# Patient Record
Sex: Female | Born: 1955 | Race: White | Hispanic: No | Smoking: Former smoker
Health system: Southern US, Community
[De-identification: ages and names within clinical notes are randomized; demographics above are authoritative.]

## PROBLEM LIST (undated history)

## (undated) DIAGNOSIS — K5090 Crohn's disease, unspecified, without complications: Secondary | ICD-10-CM

## (undated) DIAGNOSIS — M8580 Other specified disorders of bone density and structure, unspecified site: Secondary | ICD-10-CM

## (undated) DIAGNOSIS — F419 Anxiety disorder, unspecified: Secondary | ICD-10-CM

## (undated) DIAGNOSIS — I10 Essential (primary) hypertension: Secondary | ICD-10-CM

## (undated) DIAGNOSIS — A749 Chlamydial infection, unspecified: Secondary | ICD-10-CM

## (undated) DIAGNOSIS — K219 Gastro-esophageal reflux disease without esophagitis: Secondary | ICD-10-CM

## (undated) HISTORY — DX: Crohn's disease, unspecified, without complications: K50.90

## (undated) HISTORY — PX: TONSILECTOMY/ADENOIDECTOMY WITH MYRINGOTOMY: SHX6125

## (undated) HISTORY — DX: Gastro-esophageal reflux disease without esophagitis: K21.9

## (undated) HISTORY — DX: Chlamydial infection, unspecified: A74.9

## (undated) HISTORY — PX: OTHER SURGICAL HISTORY: SHX169

---

## 2004-08-20 ENCOUNTER — Ambulatory Visit: Payer: Self-pay | Admitting: Gastroenterology

## 2005-01-22 ENCOUNTER — Ambulatory Visit: Payer: Self-pay | Admitting: Internal Medicine

## 2006-10-07 ENCOUNTER — Ambulatory Visit: Payer: Self-pay | Admitting: Internal Medicine

## 2007-09-27 ENCOUNTER — Ambulatory Visit: Payer: Self-pay | Admitting: Unknown Physician Specialty

## 2007-10-19 ENCOUNTER — Ambulatory Visit: Payer: Self-pay | Admitting: Internal Medicine

## 2007-12-28 ENCOUNTER — Inpatient Hospital Stay: Payer: Self-pay | Admitting: Internal Medicine

## 2008-10-20 ENCOUNTER — Ambulatory Visit: Payer: Self-pay | Admitting: Internal Medicine

## 2009-11-07 ENCOUNTER — Ambulatory Visit: Payer: Self-pay | Admitting: Internal Medicine

## 2010-10-22 LAB — HM PAP SMEAR

## 2010-12-25 ENCOUNTER — Ambulatory Visit: Payer: Self-pay | Admitting: Internal Medicine

## 2011-12-23 ENCOUNTER — Telehealth: Payer: Self-pay | Admitting: Internal Medicine

## 2011-12-23 DIAGNOSIS — Z139 Encounter for screening, unspecified: Secondary | ICD-10-CM

## 2011-12-23 NOTE — Telephone Encounter (Signed)
Pt stated it is time for mammogram Pt needs order  Her pt with you is 03/12/12 [place]  Earlier morning or late afternoon

## 2011-12-23 NOTE — Telephone Encounter (Signed)
I placed the order for her mammogram.  See note.

## 2012-03-01 ENCOUNTER — Ambulatory Visit: Payer: Self-pay | Admitting: Internal Medicine

## 2012-03-12 ENCOUNTER — Encounter: Payer: Self-pay | Admitting: Internal Medicine

## 2012-03-12 ENCOUNTER — Ambulatory Visit (INDEPENDENT_AMBULATORY_CARE_PROVIDER_SITE_OTHER): Payer: BC Managed Care – PPO | Admitting: Internal Medicine

## 2012-03-12 VITALS — BP 118/68 | HR 75 | Temp 98.1°F | Ht 65.0 in | Wt 156.2 lb

## 2012-03-12 DIAGNOSIS — K5090 Crohn's disease, unspecified, without complications: Secondary | ICD-10-CM

## 2012-03-12 DIAGNOSIS — K219 Gastro-esophageal reflux disease without esophagitis: Secondary | ICD-10-CM

## 2012-03-12 DIAGNOSIS — M899 Disorder of bone, unspecified: Secondary | ICD-10-CM

## 2012-03-12 DIAGNOSIS — M949 Disorder of cartilage, unspecified: Secondary | ICD-10-CM

## 2012-03-12 DIAGNOSIS — M8580 Other specified disorders of bone density and structure, unspecified site: Secondary | ICD-10-CM

## 2012-03-12 NOTE — Progress Notes (Signed)
  Subjective:    Patient ID: Lauren Chang, female    DOB: Mar 08, 1956, 57 y.o.   MRN: [id number]  HPI 57 year old female with past history of Crohn's disease and acid reflux who comes in today to follow up on these issues as well as for a complete physical exam.  She states she is doing relatively well.  Saw Ebony Cargo in follow up for her Crohn's.  On Delzicol 432m tablets now.  (same as asacol).  Bowels stable.  Due a follow up colonoscopy next year.  No abdominal pain or cramping.  No cardiac symptoms with increased activity or exertion.  Breathing stable.    Past Medical History  Diagnosis Date  . Crohn's disease     large intestine  . Chlamydia   . GERD (gastroesophageal reflux disease)     Current Outpatient Prescriptions on File Prior to Visit  Medication Sig Dispense Refill  . Calcium Carbonate-Vitamin D (CALCIUM 600+D) 600-400 MG-UNIT per tablet Take 1 tablet by mouth 2 (two) times daily.      . mesalamine (ASACOL) 400 MG EC tablet 2 tablets tid      . pantoprazole (PROTONIX) 40 MG tablet Take 40 mg by mouth daily.        Review of Systems Patient denies any headache, lightheadedness or dizziness.  No sinus or allergy symptoms.   No chest pain, tightness or palpitations.  No increased shortness of breath, cough or congestion.  No nausea or vomiting.  No acid reflux.  No swallowing difficulty.  No abdominal pain or cramping.  No bowel change, such as diarrhea, constipation, BRBPR or melana.  No urine change.        Objective:   Physical Exam Filed Vitals:   03/12/12 0813  BP: 118/68  Pulse: 75  Temp: 98.1 F ([id number])   57year old female in no acute distress.   HEENT:  Nares- clear.  Oropharynx - without lesions. NECK:  Supple.  Nontender.  No audible bruit.  HEART:  Appears to be regular. LUNGS:  No crackles or wheezing audible.  Respirations even and unlabored.  RADIAL PULSE:  Equal bilaterally.    BREASTS:  No nipple discharge or nipple retraction present.   Could not appreciate any distinct nodules or axillary adenopathy.  ABDOMEN:  Soft, nontender.  Bowel sounds present and normal.  No audible abdominal bruit.  GU:  Normal external genitalia.  Vaginal vault without lesions.  Cervix identified.  Pap performed. Could not appreciate any adnexal masses or tenderness.   RECTAL:  Heme negative.   EXTREMITIES:  No increased edema present.  DP pulses palpable and equal bilaterally.           Assessment & Plan:  CARDIOVASCULAR.  Asymptomatic.    INCREASED PSYCHOSOCIAL STRESSORS.  Doing well.  Follow.   GYN.  Was seeing Dr KRayford Halsted  Pelvic/pap today.  Follow.    HEALTH MAINTENANCE.  Physical today.  Pelvic/pap today.  Colonoscopy 09/27/07 revealed a few erosions in the cecum, internal hemorrhoids and a 466mpolyp in the transverse colon.  States due a follow up colonoscopy next year.  Mammogram 03/01/12 - BiRADS II.

## 2012-03-14 ENCOUNTER — Encounter: Payer: Self-pay | Admitting: Internal Medicine

## 2012-03-14 DIAGNOSIS — M810 Age-related osteoporosis without current pathological fracture: Secondary | ICD-10-CM | POA: Insufficient documentation

## 2012-03-14 NOTE — Assessment & Plan Note (Signed)
Just saw GI.  On Delzicol.  Symptoms controlled.  Planning for a follow up colonoscopy next year.

## 2012-03-14 NOTE — Assessment & Plan Note (Signed)
Reflux symptoms controlled on Protonix.  Just saw GI.

## 2012-03-14 NOTE — Assessment & Plan Note (Signed)
Bone density 09/19/08 - osteopenia.  Taking calcium and vitamin D.  Recheck bone density.

## 2012-03-30 ENCOUNTER — Encounter: Payer: Self-pay | Admitting: Internal Medicine

## 2012-05-24 ENCOUNTER — Telehealth: Payer: Self-pay | Admitting: *Deleted

## 2012-05-24 NOTE — Telephone Encounter (Signed)
Please clarify with pt. If heel fx - may need podiatry.  Let me know and I will order.

## 2012-05-24 NOTE — Telephone Encounter (Signed)
Patient stated that she already has appointment tomorrow with an orthopedic foot physician in [place]. Patient stated that she will follow-up with [place] after.

## 2012-05-24 NOTE — Telephone Encounter (Signed)
Patient left message on voicemail stating she would like a referral or recommendation to an orthopaedic surgeon. She had an injury and went to the ER over the wknd, she broke her heel bone in about 3 places.

## 2012-06-12 ENCOUNTER — Encounter: Payer: Self-pay | Admitting: Internal Medicine

## 2012-06-25 ENCOUNTER — Encounter: Payer: Self-pay | Admitting: Internal Medicine

## 2012-06-25 DIAGNOSIS — K5090 Crohn's disease, unspecified, without complications: Secondary | ICD-10-CM

## 2012-12-28 LAB — HM COLONOSCOPY

## 2013-01-24 ENCOUNTER — Encounter: Payer: Self-pay | Admitting: Internal Medicine

## 2013-03-18 ENCOUNTER — Encounter (INDEPENDENT_AMBULATORY_CARE_PROVIDER_SITE_OTHER): Payer: Self-pay

## 2013-03-18 ENCOUNTER — Encounter: Payer: Self-pay | Admitting: Internal Medicine

## 2013-03-18 ENCOUNTER — Ambulatory Visit (INDEPENDENT_AMBULATORY_CARE_PROVIDER_SITE_OTHER): Payer: BC Managed Care – PPO | Admitting: Internal Medicine

## 2013-03-18 VITALS — BP 130/80 | HR 60 | Temp 97.9°F | Resp 18 | Ht 65.5 in | Wt 155.8 lb

## 2013-03-18 DIAGNOSIS — M949 Disorder of cartilage, unspecified: Secondary | ICD-10-CM

## 2013-03-18 DIAGNOSIS — S92001D Unspecified fracture of right calcaneus, subsequent encounter for fracture with routine healing: Secondary | ICD-10-CM

## 2013-03-18 DIAGNOSIS — K5090 Crohn's disease, unspecified, without complications: Secondary | ICD-10-CM

## 2013-03-18 DIAGNOSIS — E2839 Other primary ovarian failure: Secondary | ICD-10-CM

## 2013-03-18 DIAGNOSIS — IMO0001 Reserved for inherently not codable concepts without codable children: Secondary | ICD-10-CM

## 2013-03-18 DIAGNOSIS — K219 Gastro-esophageal reflux disease without esophagitis: Secondary | ICD-10-CM

## 2013-03-18 DIAGNOSIS — M899 Disorder of bone, unspecified: Secondary | ICD-10-CM

## 2013-03-18 DIAGNOSIS — M8580 Other specified disorders of bone density and structure, unspecified site: Secondary | ICD-10-CM

## 2013-03-18 DIAGNOSIS — Z1239 Encounter for other screening for malignant neoplasm of breast: Secondary | ICD-10-CM

## 2013-03-18 DIAGNOSIS — Z23 Encounter for immunization: Secondary | ICD-10-CM

## 2013-03-18 NOTE — Progress Notes (Signed)
  Subjective:    Patient ID: Lauren Chang, female    DOB: [birth year], 58 y.o.   MRN: [id number]  HPI 58 year old female with past history of Crohn's disease and acid reflux who comes in today to follow up on these issues as well as for a complete physical exam.  She states she is doing relatively well.  Sees GI.   Bowels stable.  Had recent colonoscopy.   No abdominal pain or cramping.  No cardiac symptoms with increased activity or exertion.  Breathing stable.  Is s/p right calcaneous fracture.  S/p ORIF.  Went to physical therapy.  Still some pain.  Plans to f/u with her surgeon.  Overall she feels she is doing well.       Past Medical History  Diagnosis Date  . Crohn's disease     large intestine  . Chlamydia   . GERD (gastroesophageal reflux disease)     Current Outpatient Prescriptions on File Prior to Visit  Medication Sig Dispense Refill  . Calcium Carbonate-Vitamin D (CALCIUM 600+D) 600-400 MG-UNIT per tablet Take 1 tablet by mouth 2 (two) times daily.      . mesalamine (ASACOL) 400 MG EC tablet 2 tablets tid      . pantoprazole (PROTONIX) 40 MG tablet Take 40 mg by mouth daily.       No current facility-administered medications on file prior to visit.    Review of Systems Patient denies any headache, lightheadedness or dizziness.  No sinus or allergy symptoms.   No chest pain, tightness or palpitations.  No increased shortness of breath, cough or congestion.  No nausea or vomiting.  No acid reflux.  No swallowing difficulty.  No abdominal pain or cramping.  No bowel change, such as diarrhea, constipation, BRBPR or melana.  No urine change.        Objective:   Physical Exam  Filed Vitals:   03/18/13 0855  BP: 130/80  Pulse: 60  Temp: 97.9 F (36.6 C)  Resp: 52   59 year old female in no acute distress.   HEENT:  Nares- clear.  Oropharynx - without lesions. NECK:  Supple.  Nontender.  No audible bruit.  HEART:  Appears to be regular. LUNGS:  No crackles or wheezing  audible.  Respirations even and unlabored.  RADIAL PULSE:  Equal bilaterally.    BREASTS:  No nipple discharge or nipple retraction present.  Could not appreciate any distinct nodules or axillary adenopathy.  ABDOMEN:  Soft, nontender.  Bowel sounds present and normal.  No audible abdominal bruit.  GU:  Not performed.    EXTREMITIES:  No increased edema present.  DP pulses palpable and equal bilaterally.           Assessment & Plan:  CARDIOVASCULAR.  Asymptomatic.    INCREASED PSYCHOSOCIAL STRESSORS.  Doing well.  Follow.   GYN.  Was seeing Dr Rayford Halsted.  Pelvic/pap last year.  Pap negative with negative HPV.    HEALTH MAINTENANCE.  Physical today.  Pelvic/pap last year.  Colonoscopy 09/27/07 revealed a few erosions in the cecum, internal hemorrhoids and a 9m polyp in the transverse colon.  States just had colonoscopy 12/28/12.  Need results.  Mammogram 03/01/12 - BiRADS II.  Schedule f/u mammogram.

## 2013-03-18 NOTE — Progress Notes (Signed)
Pre-visit discussion using our clinic review tool. No additional management support is needed unless otherwise documented below in the visit note.  

## 2013-03-20 ENCOUNTER — Encounter: Payer: Self-pay | Admitting: Internal Medicine

## 2013-03-20 DIAGNOSIS — S92009A Unspecified fracture of unspecified calcaneus, initial encounter for closed fracture: Secondary | ICD-10-CM | POA: Insufficient documentation

## 2013-03-20 NOTE — Assessment & Plan Note (Signed)
Just saw GI.  On Delzicol.  Symptoms controlled.  Just had colonoscopy 12/28/12.  Recommended f/u colonoscopy in tow years.

## 2013-03-20 NOTE — Assessment & Plan Note (Signed)
S/p ORIF.  Continue to follow up with ortho.

## 2013-03-20 NOTE — Assessment & Plan Note (Signed)
Bone density 09/19/08 - osteopenia.  Taking calcium and vitamin D.  Recheck bone density.  Schedule.

## 2013-03-20 NOTE — Assessment & Plan Note (Signed)
Reflux symptoms controlled on Protonix.  Just saw GI.

## 2013-06-07 LAB — HM DEXA SCAN

## 2013-10-15 ENCOUNTER — Other Ambulatory Visit: Payer: Self-pay | Admitting: Internal Medicine

## 2013-12-23 ENCOUNTER — Encounter: Payer: Self-pay | Admitting: *Deleted

## 2013-12-23 ENCOUNTER — Ambulatory Visit: Payer: Self-pay | Admitting: Internal Medicine

## 2013-12-23 LAB — HM MAMMOGRAPHY: HM Mammogram: NEGATIVE

## 2014-03-21 ENCOUNTER — Encounter: Payer: BC Managed Care – PPO | Admitting: Internal Medicine

## 2014-05-12 ENCOUNTER — Other Ambulatory Visit (HOSPITAL_COMMUNITY)
Admission: RE | Admit: 2014-05-12 | Discharge: 2014-05-12 | Disposition: A | Discharge status: Home / Self Care | Payer: BLUE CROSS/BLUE SHIELD | Source: Ambulatory Visit | Attending: Internal Medicine | Admitting: Internal Medicine

## 2014-05-12 ENCOUNTER — Ambulatory Visit (INDEPENDENT_AMBULATORY_CARE_PROVIDER_SITE_OTHER): Payer: BLUE CROSS/BLUE SHIELD | Admitting: Internal Medicine

## 2014-05-12 ENCOUNTER — Encounter: Payer: Self-pay | Admitting: Internal Medicine

## 2014-05-12 ENCOUNTER — Encounter (INDEPENDENT_AMBULATORY_CARE_PROVIDER_SITE_OTHER): Payer: Self-pay

## 2014-05-12 VITALS — BP 126/76 | HR 79 | Temp 97.7°F | Ht 65.0 in | Wt 156.1 lb

## 2014-05-12 DIAGNOSIS — Z13220 Encounter for screening for lipoid disorders: Secondary | ICD-10-CM

## 2014-05-12 DIAGNOSIS — Z1151 Encounter for screening for human papillomavirus (HPV): Secondary | ICD-10-CM | POA: Diagnosis present

## 2014-05-12 DIAGNOSIS — K219 Gastro-esophageal reflux disease without esophagitis: Secondary | ICD-10-CM | POA: Diagnosis not present

## 2014-05-12 DIAGNOSIS — K50919 Crohn's disease, unspecified, with unspecified complications: Secondary | ICD-10-CM

## 2014-05-12 DIAGNOSIS — Z0000 Encounter for general adult medical examination without abnormal findings: Secondary | ICD-10-CM

## 2014-05-12 DIAGNOSIS — Z01419 Encounter for gynecological examination (general) (routine) without abnormal findings: Secondary | ICD-10-CM | POA: Insufficient documentation

## 2014-05-12 DIAGNOSIS — R0683 Snoring: Secondary | ICD-10-CM

## 2014-05-12 DIAGNOSIS — M8580 Other specified disorders of bone density and structure, unspecified site: Secondary | ICD-10-CM

## 2014-05-12 LAB — BASIC METABOLIC PANEL
BUN: 13 mg/dL (ref 6–23)
CO2: 30 mEq/L (ref 19–32)
Calcium: 10.4 mg/dL (ref 8.4–10.5)
Chloride: 100 mEq/L (ref 96–112)
Creatinine, Ser: 0.75 mg/dL (ref 0.40–1.20)
GFR: 84.39 mL/min (ref >60.00)
Glucose, Bld: 92 mg/dL (ref 70–99)
Potassium: 4.3 mEq/L (ref 3.5–5.1)
Sodium: 137 mEq/L (ref 135–145)

## 2014-05-12 LAB — CBC WITH DIFFERENTIAL/PLATELET
Basophils Absolute: 0 10*3/uL (ref 0.0–0.1)
Basophils Relative: 0.3 % (ref 0.0–3.0)
Eosinophils Absolute: 0.1 10*3/uL (ref 0.0–0.7)
Eosinophils Relative: 1.6 % (ref 0.0–5.0)
HCT: 43.8 % (ref 36.0–46.0)
Hemoglobin: 14.8 g/dL (ref 12.0–15.0)
Lymphocytes Relative: 24.1 % (ref 12.0–46.0)
Lymphs Abs: 2.2 10*3/uL (ref 0.7–4.0)
MCHC: 33.9 g/dL (ref 30.0–36.0)
MCV: 87.1 fl (ref 78.0–100.0)
Monocytes Absolute: 0.8 10*3/uL (ref 0.1–1.0)
Monocytes Relative: 8.6 % (ref 3.0–12.0)
Neutro Abs: 6.1 10*3/uL (ref 1.4–7.7)
Neutrophils Relative %: 65.4 % (ref 43.0–77.0)
Platelets: 254 10*3/uL (ref 150.0–400.0)
RBC: 5.03 Mil/uL (ref 3.87–5.11)
RDW: 13.1 % (ref 11.5–15.5)
WBC: 9.4 10*3/uL (ref 4.0–10.5)

## 2014-05-12 LAB — VITAMIN D 25 HYDROXY (VIT D DEFICIENCY, FRACTURES): VITD: 37.87 ng/mL (ref 30.00–100.00)

## 2014-05-12 LAB — LIPID PANEL
Cholesterol: 183 mg/dL (ref 0–200)
HDL: 52.1 mg/dL (ref >39.00)
LDL Cholesterol: 102 mg/dL — ABNORMAL HIGH (ref 0–99)
NonHDL: 130.9
Total CHOL/HDL Ratio: 4
Triglycerides: 146 mg/dL (ref 0.0–149.0)
VLDL: 29.2 mg/dL (ref 0.0–40.0)

## 2014-05-12 LAB — HEPATIC FUNCTION PANEL
ALT: 24 U/L (ref 0–35)
AST: 22 U/L (ref 0–37)
Albumin: 4.8 g/dL (ref 3.5–5.2)
Alkaline Phosphatase: 83 U/L (ref 39–117)
Bilirubin, Direct: 0.1 mg/dL (ref 0.0–0.3)
Total Bilirubin: 0.5 mg/dL (ref 0.2–1.2)
Total Protein: 7.8 g/dL (ref 6.0–8.3)

## 2014-05-12 LAB — TSH: TSH: 0.62 u[IU]/mL (ref 0.35–4.50)

## 2014-05-12 MED ORDER — FLUTICASONE PROPIONATE 50 MCG/ACT NA SUSP: 2.0000 | Freq: Every day | NASAL | Status: DC

## 2014-05-12 MED ORDER — PANTOPRAZOLE SODIUM 40 MG PO TBEC: 40.0000 mg | DELAYED_RELEASE_TABLET | Freq: Every day | ORAL | Status: DC

## 2014-05-12 NOTE — Progress Notes (Signed)
Pre visit review using our clinic review tool, if applicable. No additional management support is needed unless otherwise documented below in the visit note. 

## 2014-05-12 NOTE — Progress Notes (Signed)
Patient ID: Lauren Chang, female   DOB: 04/12/1955, 59 y.o.   MRN: [id number]   Subjective:    Patient ID: Lauren Chang, female    DOB: 11-Mar-1955, 59 y.o.   MRN: [id number]  HPI  Patient here for her physical exam.  States she is doing relatively well.  Ran out of her protonix.  Taking zantac.  Acid reflux has flared. Husband has noticed some snoring with the increased acid reflux.  She does wake up during the night.  Has no trouble falling asleep.  Feels she is handling stress relatively well.  Tries to stay active.  No cardiac symptoms with increased activity or exertion.  Breathing stable.  Bowels stable.     Past Medical History  Diagnosis Date  . Crohn's disease     large intestine  . Chlamydia   . GERD (gastroesophageal reflux disease)     Review of Systems  Constitutional: Negative for appetite change and unexpected weight change.  HENT: Negative for congestion and sinus pressure.   Eyes: Negative for pain and visual disturbance.  Respiratory: Negative for cough, chest tightness and shortness of breath.   Cardiovascular: Negative for chest pain, palpitations and leg swelling.  Gastrointestinal: Negative for nausea, vomiting and abdominal pain.       Bowels stable.  Ran out of protonix.  Increased acid reflux.    Genitourinary: Negative for frequency and difficulty urinating.  Musculoskeletal: Negative for back pain and joint swelling.  Skin: Negative for color change and rash.  Neurological: Negative for dizziness, light-headedness and headaches.  Hematological: Negative for adenopathy. Does not bruise/bleed easily.  Psychiatric/Behavioral: Negative for dysphoric mood and decreased concentration.       Objective:    Physical Exam  Constitutional: She is oriented to person, place, and time. She appears well-developed and well-nourished.  HENT:  Nose: Nose normal.  Mouth/Throat: Oropharynx is clear and moist.  Eyes: Right eye exhibits no discharge. Left eye  exhibits no discharge. No scleral icterus.  Neck: Neck supple. No thyromegaly present.  Cardiovascular: Normal rate and regular rhythm.   Pulmonary/Chest: Breath sounds normal. No accessory muscle usage. No tachypnea. No respiratory distress. She has no decreased breath sounds. She has no wheezes. She has no rhonchi. Right breast exhibits no inverted nipple, no mass, no nipple discharge and no tenderness (no axillary adenopathy). Left breast exhibits no inverted nipple, no mass, no nipple discharge and no tenderness (no axilarry adenopathy).  Abdominal: Soft. Bowel sounds are normal. There is no tenderness.  Genitourinary:  Normal external genitalia.  Vaginal vault without lesions.  Cervix identified.  Pap smear performed.  Could not appreciate any adnexal masses or tenderness.    Musculoskeletal: She exhibits no edema or tenderness.  Lymphadenopathy:    She has no cervical adenopathy.  Neurological: She is alert and oriented to person, place, and time.  Skin: Skin is warm. No rash noted.  Psychiatric: She has a normal mood and affect. Her behavior is normal.    BP 126/76 mmHg  Pulse 79  Temp(Src) 97.7 F (36.5 C) (Oral)  Ht 5' 5"  (1.651 m)  Wt 156 lb 2 oz (70.818 kg)  BMI 25.98 kg/m2  SpO2 98%  LMP 04/12/2010 Wt Readings from Last 3 Encounters:  05/12/14 156 lb 2 oz (70.818 kg)  03/18/13 155 lb 12 oz (70.648 kg)  03/12/12 156 lb 4 oz (70.875 kg)       Assessment & Plan:   Problem List Items Addressed This Visit  Crohn's disease - Primary    Due to see GI.  She will make appt.  Colonoscopy 12/28/12 with results as outlined.  Recommended f/u colonoscopy in 12/2014.        Relevant Orders   CBC with Differential/Platelet   TSH (Completed)   Basic metabolic panel (Completed)   Hepatic function panel (Completed)   Cytology - PAP   GERD (gastroesophageal reflux disease)    Out of protonix.  Had flare.  Taking zantac now.  Will restart protonix.  Follow.  Get her back in soon  to reassess.        Relevant Medications   Mesalamine (ASACOL) 400 MG CPDR DR capsule   pantoprazole (PROTONIX) EC tablet   Other Relevant Orders   Cytology - PAP   Health care maintenance    Physical today.  PAP today.  Mammogram 12/23/13 - Birads I.  Colonoscopy 12/28/12.  Recommended f/u colonoscopy in 12/2014.        Osteopenia    Previous bone density 09/19/08.  Instructed on weight bearing exercise, calcium and vitamin D.  Recheck bone density.        Relevant Orders   Vit D  25 hydroxy (rtn osteoporosis monitoring) (Completed)   Cytology - PAP   Snoring    States just started with worsening reflux.  We discussed the possibility of sleep apnea.  She is not interested in pursuing further testing at this time.  Wants to get back on her protonix and see if symptoms resolve.         Other Visit Diagnoses    Screening cholesterol level        Relevant Orders    Lipid panel (Completed)    Cytology - PAP      I spent 25 minutes with the patient and more than 50% of the time was spent in consultation regarding the above.     [name], MD

## 2014-05-15 ENCOUNTER — Encounter: Payer: Self-pay | Admitting: *Deleted

## 2014-05-15 ENCOUNTER — Encounter: Payer: Self-pay | Admitting: Internal Medicine

## 2014-05-15 DIAGNOSIS — Z0000 Encounter for general adult medical examination without abnormal findings: Secondary | ICD-10-CM | POA: Insufficient documentation

## 2014-05-15 DIAGNOSIS — R0683 Snoring: Secondary | ICD-10-CM | POA: Insufficient documentation

## 2014-05-15 DIAGNOSIS — R069 Unspecified abnormalities of breathing: Secondary | ICD-10-CM | POA: Insufficient documentation

## 2014-05-15 LAB — CYTOLOGY - PAP

## 2014-05-15 NOTE — Assessment & Plan Note (Signed)
States just started with worsening reflux.  We discussed the possibility of sleep apnea.  She is not interested in pursuing further testing at this time.  Wants to get back on her protonix and see if symptoms resolve.

## 2014-05-15 NOTE — Assessment & Plan Note (Signed)
Out of protonix.  Had flare.  Taking zantac now.  Will restart protonix.  Follow.  Get her back in soon to reassess.

## 2014-05-15 NOTE — Assessment & Plan Note (Signed)
Due to see GI.  She will make appt.  Colonoscopy 12/28/12 with results as outlined.  Recommended f/u colonoscopy in 12/2014.

## 2014-05-15 NOTE — Assessment & Plan Note (Addendum)
Physical today.  PAP today.  Mammogram 12/23/13 - Birads I.  Colonoscopy 12/28/12.  Recommended f/u colonoscopy in 12/2014.

## 2014-05-15 NOTE — Assessment & Plan Note (Signed)
Previous bone density 09/19/08.  Instructed on weight bearing exercise, calcium and vitamin D.  Recheck bone density.

## 2014-05-16 ENCOUNTER — Encounter: Payer: Self-pay | Admitting: *Deleted

## 2014-08-12 ENCOUNTER — Emergency Department
Admission: EM | Admit: 2014-08-12 | Discharge: 2014-08-12 | Disposition: A | Discharge status: Home / Self Care | Payer: BLUE CROSS/BLUE SHIELD | Attending: Emergency Medicine | Admitting: Emergency Medicine

## 2014-08-12 ENCOUNTER — Encounter: Payer: Self-pay | Admitting: Emergency Medicine

## 2014-08-12 DIAGNOSIS — Z79899 Other long term (current) drug therapy: Secondary | ICD-10-CM | POA: Diagnosis not present

## 2014-08-12 DIAGNOSIS — Z87891 Personal history of nicotine dependence: Secondary | ICD-10-CM | POA: Diagnosis not present

## 2014-08-12 DIAGNOSIS — K50111 Crohn's disease of large intestine with rectal bleeding: Secondary | ICD-10-CM | POA: Insufficient documentation

## 2014-08-12 DIAGNOSIS — K625 Hemorrhage of anus and rectum: Secondary | ICD-10-CM | POA: Diagnosis present

## 2014-08-12 DIAGNOSIS — K50911 Crohn's disease, unspecified, with rectal bleeding: Secondary | ICD-10-CM

## 2014-08-12 DIAGNOSIS — Z7952 Long term (current) use of systemic steroids: Secondary | ICD-10-CM | POA: Diagnosis not present

## 2014-08-12 LAB — URINALYSIS COMPLETE WITH MICROSCOPIC (ARMC ONLY)
Bilirubin Urine: NEGATIVE
Glucose, UA: NEGATIVE mg/dL
Hgb urine dipstick: NEGATIVE
Ketones, ur: NEGATIVE mg/dL
Nitrite: NEGATIVE
Protein, ur: NEGATIVE mg/dL
Specific Gravity, Urine: 1.006 (ref 1.005–1.030)
pH: 6 (ref 5.0–8.0)

## 2014-08-12 LAB — CBC WITH DIFFERENTIAL/PLATELET
Basophils Absolute: 0 10*3/uL (ref 0–0.1)
Basophils Relative: 0 %
Eosinophils Absolute: 0.1 10*3/uL (ref 0–0.7)
Eosinophils Relative: 1 %
HCT: 39.7 % (ref 35.0–47.0)
Hemoglobin: 13.2 g/dL (ref 12.0–16.0)
Lymphocytes Relative: 15 %
Lymphs Abs: 1.8 10*3/uL (ref 1.0–3.6)
MCH: 29.7 pg (ref 26.0–34.0)
MCHC: 33.2 g/dL (ref 32.0–36.0)
MCV: 89.5 fL (ref 80.0–100.0)
Monocytes Absolute: 1.2 10*3/uL — ABNORMAL HIGH (ref 0.2–0.9)
Monocytes Relative: 10 %
Neutro Abs: 9 10*3/uL — ABNORMAL HIGH (ref 1.4–6.5)
Neutrophils Relative %: 74 %
Platelets: 253 10*3/uL (ref 150–440)
RBC: 4.44 MIL/uL (ref 3.80–5.20)
RDW: 12.5 % (ref 11.5–14.5)
WBC: 12.2 10*3/uL — ABNORMAL HIGH (ref 3.6–11.0)

## 2014-08-12 LAB — COMPREHENSIVE METABOLIC PANEL
ALT: 18 U/L (ref 14–54)
AST: 26 U/L (ref 15–41)
Albumin: 4.2 g/dL (ref 3.5–5.0)
Alkaline Phosphatase: 76 U/L (ref 38–126)
Anion gap: 9 (ref 5–15)
BUN: 14 mg/dL (ref 6–20)
CO2: 25 mmol/L (ref 22–32)
Calcium: 9.8 mg/dL (ref 8.9–10.3)
Chloride: 103 mmol/L (ref 101–111)
Creatinine, Ser: 0.77 mg/dL (ref 0.44–1.00)
GFR calc Af Amer: >60 mL/min (ref >60)
GFR calc non Af Amer: >60 mL/min (ref >60)
Glucose, Bld: 128 mg/dL — ABNORMAL HIGH (ref 65–99)
Potassium: 4 mmol/L (ref 3.5–5.1)
Sodium: 137 mmol/L (ref 135–145)
Total Bilirubin: 0.6 mg/dL (ref 0.3–1.2)
Total Protein: 7.7 g/dL (ref 6.5–8.1)

## 2014-08-12 MED ORDER — PREDNISONE 10 MG PO TABS: 40.0000 mg | ORAL_TABLET | Freq: Every day | ORAL | Status: DC

## 2014-08-12 MED ORDER — METRONIDAZOLE 250 MG PO TABS: 250.0000 mg | ORAL_TABLET | Freq: Three times a day (TID) | ORAL | Status: AC

## 2014-08-12 NOTE — ED Notes (Signed)
Pt to ed with c/o bloody stools x 2 days.  Pt reports hx of chrons, spoke with GI on call for [facility], was told to go get cbc done, went to [facility] but states they sent her here. Only states mild abd pain.

## 2014-08-12 NOTE — ED Provider Notes (Signed)
[facility] Emergency Department Provider Note  ____________________________________________  Time seen: 2 PM  I have reviewed the triage vital signs and the nursing notes.   HISTORY  Chief Complaint Rectal Bleeding     HPI Lauren Chang is a 59 y.o. female who presents with rectal bleeding that is mild for 2 days. She reports long history of Crohn's disease and has had this happen before. She called her GI doctor who recommended that she had a CBC done. She denies abdominal pain. She is not on blood thinners.     Past Medical History  Diagnosis Date  . Crohn's disease     large intestine  . Chlamydia   . GERD (gastroesophageal reflux disease)     Patient Active Problem List   Diagnosis Date Noted  . Snoring 05/15/2014  . Health care maintenance 05/15/2014  . Calcaneal fracture 03/20/2013  . Osteopenia 03/14/2012  . Crohn's disease 03/12/2012  . GERD (gastroesophageal reflux disease) 03/12/2012    Past Surgical History  Procedure Laterality Date  . Laparoscopy and tuba repair    . Tonsilectomy/adenoidectomy with myringotomy    . Orif right calcaneus      Current Outpatient Rx  Name  Route  Sig  Dispense  Refill  . Calcium Carbonate-Vitamin D (CALCIUM 600+D) 600-400 MG-UNIT per tablet   Oral   Take 1 tablet by mouth 2 (two) times daily.         . cholecalciferol (VITAMIN D) 400 UNITS TABS tablet   Oral   Take 400 Units by mouth 3 (three) times a week.         . fluticasone (FLONASE) 50 MCG/ACT nasal spray   Each Nare   Place 2 sprays into both nostrils daily.   16 g   5   . Mesalamine (ASACOL) 400 MG CPDR DR capsule   Oral   Take 800 mg by mouth 2 (two) times daily.         . pantoprazole (PROTONIX) 40 MG tablet   Oral   Take 1 tablet (40 mg total) by mouth daily.   30 tablet   3     Allergies Bactrim; Ciprofloxacin; Ibuprofen; Oxycodone; and Oxycontin  Family History  Problem Relation Age of Onset  .  Lymphoma Father   . Asthma Mother   . Osteoporosis Mother   . Mental illness Brother     suicide  . Pancreatic cancer      uncle  . COPD Maternal Grandmother   . Breast cancer Neg Hx   . Colon cancer Neg Hx     Social History History  Substance Use Topics  . Smoking status: Former Smoker    Quit date: 03/11/1999  . Smokeless tobacco: Never Used  . Alcohol Use: No    Review of Systems  Constitutional: Negative for fever. Eyes: Negative for visual changes. ENT: Negative for sore throat Cardiovascular: Negative for chest pain. Respiratory: Negative for shortness of breath. Gastrointestinal: Negative for abdominal pain, vomiting and diarrhea. Genitourinary: Negative for dysuria. Musculoskeletal: Negative for back pain. Skin: Negative for rash. Neurological: Negative for headaches or focal weakness   10-point ROS otherwise negative.  ____________________________________________   PHYSICAL EXAM:  VITAL SIGNS: ED Triage Vitals  Enc Vitals Group     BP 08/12/14 1202 139/84 mmHg     Pulse Rate 08/12/14 1202 106     Resp 08/12/14 1202 18     Temp 08/12/14 1202 98.2 F (36.8 C)     Temp  Source 08/12/14 1202 Oral     SpO2 08/12/14 1202 97 %     Weight 08/12/14 1202 160 lb (72.576 kg)     Height 08/12/14 1202 5' 5"  (1.651 m)     Head Cir --      Peak Flow --      Pain Score 08/12/14 1203 1     Pain Loc --      Pain Edu? --      Excl. in [place]? --      Constitutional: Alert and oriented. Well appearing and in no distress. Eyes: Conjunctivae are normal.  ENT   Head: Normocephalic and atraumatic.   Nose: No rhinnorhea.   Mouth/Throat: Mucous membranes are moist. Cardiovascular: Normal rate, regular rhythm. Normal and symmetric distal pulses are present in all extremities. No murmurs, rubs, or gallops. Respiratory: Normal respiratory effort without tachypnea nor retractions. Breath sounds are clear and equal bilaterally.  Gastrointestinal: Soft and  non-tender in all quadrants. No distention. There is no CVA tenderness. Genitourinary: deferred Musculoskeletal: Nontender with normal range of motion in all extremities. No lower extremity tenderness nor edema. Neurologic:  Normal speech and language. No gross focal neurologic deficits are appreciated. Skin:  Skin is warm, dry and intact. No rash noted. Psychiatric: Mood and affect are normal. Patient exhibits appropriate insight and judgment.  ____________________________________________    LABS (pertinent positives/negatives)  Labs Reviewed  CBC WITH DIFFERENTIAL/PLATELET - Abnormal; Notable for the following:    WBC 12.2 (*)    Neutro Abs 9.0 (*)    Monocytes Absolute 1.2 (*)    All other components within normal limits  COMPREHENSIVE METABOLIC PANEL - Abnormal; Notable for the following:    Glucose, Bld 128 (*)    All other components within normal limits  URINALYSIS COMPLETEWITH MICROSCOPIC ([facility] ONLY) - Abnormal; Notable for the following:    Color, Urine YELLOW (*)    APPearance CLEAR (*)    Leukocytes, UA 2+ (*)    Bacteria, UA MANY (*)    Squamous Epithelial / LPF 0-5 (*)    All other components within normal limits    ____________________________________________   EKG None   ____________________________________________    RADIOLOGY None   ____________________________________________   PROCEDURES  Procedure(s) performed: none  Critical Care performed: none  ____________________________________________   INITIAL IMPRESSION / ASSESSMENT AND PLAN / ED COURSE  Pertinent labs & imaging results that were available during my care of the patient were reviewed by me and considered in my medical decision making (see chart for details).  Patient well-appearing. In no distress. All vitals stable. Hemoglobin stable. Discussed with Dr. Gustavo Lah who recommends prednisone 10 days, antibiotics and close GI follow-up this  week  ____________________________________________   FINAL CLINICAL IMPRESSION(S) / ED DIAGNOSES  Final diagnoses:  Exacerbation of Crohn's disease, with rectal bleeding     Lavonia Drafts, MD 08/12/14 1451

## 2014-08-12 NOTE — Discharge Instructions (Signed)
Crohn Disease Crohn disease is a long-term (chronic) soreness and redness (inflammation) of the intestines (bowel). It can affect any portion of the digestive tract, from the mouth to the anus. It can also cause problems outside the digestive tract. Crohn disease is closely related to a disease called ulcerative colitis (together, these two diseases are called inflammatory bowel disease).  CAUSES  The cause of Crohn disease is not known. One Link Snuffer is that, in an easily affected person, the immune system is triggered to attack the body's own digestive tissue. Crohn disease runs in families. It seems to be more common in certain geographic areas and amongst certain races. There are no clear-cut dietary causes.  SYMPTOMS  Crohn disease can cause many different symptoms since it can affect many different parts of the body. Symptoms include:  Fatigue.  Weight loss.  Chronic diarrhea, sometime bloody.  Abdominal pain and cramps.  Fever.  Ulcers or canker sores in the mouth or rectum.  Anemia (low red blood cells).  Arthritis, skin problems, and eye problems may occur. Complications of Crohn disease can include:  Series of holes (perforation) of the bowel.  Portions of the intestines sticking to each other (adhesions).  Obstruction of the bowel.  Fistula formation, typically in the rectal area but also in other areas. A fistula is an opening between the bowels and the outside, or between the bowels and another organ.  A painful crack in the mucous membrane of the anus (rectal fissure). DIAGNOSIS  Your caregiver may suspect Crohn disease based on your symptoms and an exam. Blood tests may confirm that there is a problem. You may be asked to submit a stool specimen for examination. X-rays and CT scans may be necessary. Ultimately, the diagnosis is usually made after a procedure that uses a flexible tube that is inserted via your mouth or your anus. This is done under sedation and is called  either an upper endoscopy or colonoscopy. With these tests, the specialist can take tiny tissue samples and remove them from the inside of the bowel (biopsy). Examination of this biopsy tissue under a microscope can reveal Crohn disease as the cause of your symptoms. Due to the many different forms that Crohn disease can take, symptoms may be present for several years before a diagnosis is made. TREATMENT  Medications are often used to decrease inflammation and control the immune system. These include medicines related to aspirin, steroid medications, and newer and stronger medications to slow down the immune system. Some medications may be used as suppositories or enemas. A number of other medications are used or have been studied. Your caregiver will make specific recommendations. HOME CARE INSTRUCTIONS   Symptoms such as diarrhea can be controlled with medications. Avoid foods that have a laxative effect such as fresh fruit, vegetables, and dairy products. During flare-ups, you can rest your bowel by refraining from solid foods. Drink clear liquids frequently during the day. (Electrolyte or rehydrating fluids are best. Your caregiver can help you with suggestions.) Drink often to prevent loss of body fluids (dehydration). When diarrhea has cleared, eat small meals and more frequently. Avoid food additives and stimulants such as caffeine (coffee, tea, or chocolate). Enzyme supplements may help if you develop intolerance to a sugar in dairy products (lactose). Ask your caregiver or dietitian about specific dietary instructions.  Try to maintain a positive attitude. Learn relaxation techniques such as self-hypnosis, mental imaging, and muscle relaxation.  If possible, avoid stresses which can aggravate your condition.  Exercise  regularly.  Follow your diet.  Always get plenty of rest. SEEK MEDICAL CARE IF:   Your symptoms fail to improve after a week or two of new treatment.  You experience  continued weight loss.  You have ongoing cramps or loose bowels.  You develop a new skin rash, skin sores, or eye problems. SEEK IMMEDIATE MEDICAL CARE IF:   You have worsening of your symptoms or develop new symptoms.  You have a fever.  You develop bloody diarrhea.  You develop severe abdominal pain. MAKE SURE YOU:   Understand these instructions.  Will watch your condition.  Will get help right away if you are not doing well or get worse. Document Released: 12/04/2004 Document Revised: 07/11/2013 Document Reviewed: 11/02/2006 [facility] Patient Information 2015 [place], [place]. This information is not intended to replace advice given to you by your health care provider. Make sure you discuss any questions you have with your health care provider.

## 2014-08-18 ENCOUNTER — Encounter: Payer: Self-pay | Admitting: Internal Medicine

## 2014-08-18 ENCOUNTER — Ambulatory Visit (INDEPENDENT_AMBULATORY_CARE_PROVIDER_SITE_OTHER): Payer: BLUE CROSS/BLUE SHIELD | Admitting: Internal Medicine

## 2014-08-18 VITALS — BP 122/68 | HR 87 | Temp 98.0°F | Resp 14 | Ht 65.0 in | Wt 155.5 lb

## 2014-08-18 DIAGNOSIS — K219 Gastro-esophageal reflux disease without esophagitis: Secondary | ICD-10-CM | POA: Diagnosis not present

## 2014-08-18 DIAGNOSIS — Z0000 Encounter for general adult medical examination without abnormal findings: Secondary | ICD-10-CM

## 2014-08-18 DIAGNOSIS — F419 Anxiety disorder, unspecified: Secondary | ICD-10-CM

## 2014-08-18 DIAGNOSIS — K50919 Crohn's disease, unspecified, with unspecified complications: Secondary | ICD-10-CM | POA: Diagnosis not present

## 2014-08-18 DIAGNOSIS — D72829 Elevated white blood cell count, unspecified: Secondary | ICD-10-CM

## 2014-08-18 MED ORDER — ZOLPIDEM TARTRATE 5 MG PO TABS: 5.0000 mg | ORAL_TABLET | Freq: Every evening | ORAL | Status: DC | PRN

## 2014-08-18 MED ORDER — BUPROPION HCL ER (XL) 150 MG PO TB24: 150.0000 mg | ORAL_TABLET | Freq: Every day | ORAL | Status: DC

## 2014-08-18 NOTE — Progress Notes (Signed)
Patient ID: Lauren Chang, female   DOB: February 09, 1956, 58 y.o.   MRN: [id number]   Subjective:    Patient ID: Lauren Chang, female    DOB: 13-Oct-1955, 59 y.o.   MRN: [id number]  HPI  Patient here for a scheduled follow up.  Was seen in ER 08/12/14.  Diagnosed with Crohn's flare.  On prednisone.  Taking flagyl.  Doing better.  Increased stress with her husband's medical issues.  Previously had rectal bleeding.  No bleeding now.  Feels better.  Bowels more formed.  With the increased stress, has also had some issues with not being able to sleep.  We discussed her increased stress and anxiety.  She has been on wellbutrin previously.  Would like to restart.  Felt it helped her previously.  Breathing stable.     Past Medical History  Diagnosis Date  . Crohn's disease     large intestine  . Chlamydia   . GERD (gastroesophageal reflux disease)     Current Outpatient Prescriptions on File Prior to Visit  Medication Sig Dispense Refill  . Calcium Carbonate-Vitamin D (CALCIUM 600+D) 600-400 MG-UNIT per tablet Take 1 tablet by mouth 2 (two) times daily.    . cholecalciferol (VITAMIN D) 400 UNITS TABS tablet Take 400 Units by mouth 3 (three) times a week.    . fluticasone (FLONASE) 50 MCG/ACT nasal spray Place 2 sprays into both nostrils daily. 16 g 5  . Mesalamine (ASACOL) 400 MG CPDR DR capsule Take 800 mg by mouth 2 (two) times daily.    . metroNIDAZOLE (FLAGYL) 250 MG tablet Take 1 tablet (250 mg total) by mouth 3 (three) times daily. 21 tablet 0  . pantoprazole (PROTONIX) 40 MG tablet Take 1 tablet (40 mg total) by mouth daily. 30 tablet 3  . predniSONE (DELTASONE) 10 MG tablet Take 4 tablets (40 mg total) by mouth daily. For 10 days. Then take 3 tablets for 2 additional days, then 2 tablets for 2 more days, then 1 tablet for 2 additional days and then stop 52 tablet 0   No current facility-administered medications on file prior to visit.    Review of Systems  Constitutional: Negative for  unexpected weight change.       Has adjusted her diet with the recent crohn's flare.    HENT: Negative for congestion and sinus pressure.   Respiratory: Negative for cough, chest tightness and shortness of breath.   Cardiovascular: Negative for chest pain, palpitations and leg swelling.  Gastrointestinal: Positive for abdominal pain and diarrhea (bowel change with recent crohn's flare.). Negative for nausea and vomiting.  Genitourinary: Negative for dysuria and difficulty urinating.  Neurological: Negative for dizziness, light-headedness and headaches.  Psychiatric/Behavioral: Positive for sleep disturbance.       Increased stress as outlined.  Some anxiety.         Objective:    Physical Exam  Constitutional: She appears well-developed and well-nourished. No distress.  HENT:  Nose: Nose normal.  Mouth/Throat: Oropharynx is clear and moist.  Neck: Neck supple. No thyromegaly present.  Cardiovascular: Normal rate and regular rhythm.   Pulmonary/Chest: Breath sounds normal. No respiratory distress. She has no wheezes.  Abdominal: Soft. Bowel sounds are normal.  Minimal tenderness to palpation over the left lower quadrant.  No significant tenderness.   Musculoskeletal: She exhibits no edema or tenderness.  Lymphadenopathy:    She has no cervical adenopathy.  Skin: No rash noted. No erythema.  Psychiatric: She has a normal mood and affect.  Her behavior is normal.    BP 122/68 mmHg  Pulse 87  Temp(Src) 98 F (36.7 C) (Oral)  Resp 14  Ht 5' 5"  (1.651 m)  Wt 155 lb 8 oz (70.534 kg)  BMI 25.88 kg/m2  SpO2 98%  LMP 04/12/2010 Wt Readings from Last 3 Encounters:  08/18/14 155 lb 8 oz (70.534 kg)  08/12/14 160 lb (72.576 kg)  05/12/14 156 lb 2 oz (70.818 kg)     Lab Results  Component Value Date   WBC 12.2* 08/12/2014   HGB 13.2 08/12/2014   HCT 39.7 08/12/2014   PLT 253 08/12/2014   GLUCOSE 128* 08/12/2014   CHOL 183 05/12/2014   TRIG 146.0 05/12/2014   HDL 52.10  05/12/2014   LDLCALC 102* 05/12/2014   ALT 18 08/12/2014   AST 26 08/12/2014   NA 137 08/12/2014   K 4.0 08/12/2014   CL 103 08/12/2014   CREATININE 0.77 08/12/2014   BUN 14 08/12/2014   CO2 25 08/12/2014   TSH 0.62 05/12/2014       Assessment & Plan:   Problem List Items Addressed This Visit    Anxiety    Increased stress and anxiety.  D/w her today.  Discussed treatment options.  Will start wellbutrin as directed.  Desires no counseling at this time.  Follow.  Get her back in soon to reassess.        Relevant Medications   buPROPion (WELLBUTRIN XL) 150 MG 24 hr tablet   Crohn's disease - Primary    Recent crohn's flare as outlined.  On prednisone and flagyl.  Improved.  Has f/u planned with GI 09/05/14.        GERD (gastroesophageal reflux disease)    On protonix.  No upper symptoms now.  Due to f/u with GI end of month.  Follow.       Health care maintenance    Physical 05/12/14.  Mammogram 12/23/13 - Birads I.  PAP 05/12/14 negative with negative HPV.  Being followed by GI for colonoscopy.       Leukocytosis    Will need f/u cbc in future.  Recent elevation with crohn's flare.          I spent 25 minutes with the patient and more than 50% of the time was spent in consultation regarding the above.     [name], MD

## 2014-08-18 NOTE — Progress Notes (Signed)
Pre-visit discussion using our clinic review tool. No additional management support is needed unless otherwise documented below in the visit note.  

## 2014-08-19 ENCOUNTER — Encounter: Payer: Self-pay | Admitting: Internal Medicine

## 2014-08-19 DIAGNOSIS — D72829 Elevated white blood cell count, unspecified: Secondary | ICD-10-CM | POA: Insufficient documentation

## 2014-08-19 DIAGNOSIS — F419 Anxiety disorder, unspecified: Secondary | ICD-10-CM | POA: Insufficient documentation

## 2014-08-19 NOTE — Assessment & Plan Note (Signed)
Increased stress and anxiety.  D/w her today.  Discussed treatment options.  Will start wellbutrin as directed.  Desires no counseling at this time.  Follow.  Get her back in soon to reassess.

## 2014-08-19 NOTE — Assessment & Plan Note (Signed)
Physical 05/12/14.  Mammogram 12/23/13 - Birads I.  PAP 05/12/14 negative with negative HPV.  Being followed by GI for colonoscopy.

## 2014-08-19 NOTE — Assessment & Plan Note (Signed)
On protonix.  No upper symptoms now.  Due to f/u with GI end of month.  Follow.

## 2014-08-19 NOTE — Assessment & Plan Note (Signed)
Will need f/u cbc in future.  Recent elevation with crohn's flare.

## 2014-08-19 NOTE — Assessment & Plan Note (Signed)
Recent crohn's flare as outlined.  On prednisone and flagyl.  Improved.  Has f/u planned with GI 09/05/14.

## 2014-10-16 ENCOUNTER — Ambulatory Visit (INDEPENDENT_AMBULATORY_CARE_PROVIDER_SITE_OTHER): Payer: BLUE CROSS/BLUE SHIELD | Admitting: Internal Medicine

## 2014-10-16 ENCOUNTER — Encounter: Payer: Self-pay | Admitting: Internal Medicine

## 2014-10-16 VITALS — BP 110/82 | HR 76 | Temp 98.5°F | Ht 65.0 in | Wt 153.0 lb

## 2014-10-16 DIAGNOSIS — Z1239 Encounter for other screening for malignant neoplasm of breast: Secondary | ICD-10-CM

## 2014-10-16 DIAGNOSIS — K50919 Crohn's disease, unspecified, with unspecified complications: Secondary | ICD-10-CM

## 2014-10-16 DIAGNOSIS — K219 Gastro-esophageal reflux disease without esophagitis: Secondary | ICD-10-CM

## 2014-10-16 DIAGNOSIS — D72829 Elevated white blood cell count, unspecified: Secondary | ICD-10-CM

## 2014-10-16 DIAGNOSIS — M8580 Other specified disorders of bone density and structure, unspecified site: Secondary | ICD-10-CM

## 2014-10-16 DIAGNOSIS — F419 Anxiety disorder, unspecified: Secondary | ICD-10-CM

## 2014-10-16 DIAGNOSIS — G479 Sleep disorder, unspecified: Secondary | ICD-10-CM | POA: Insufficient documentation

## 2014-10-16 DIAGNOSIS — Z0000 Encounter for general adult medical examination without abnormal findings: Secondary | ICD-10-CM

## 2014-10-16 LAB — CBC WITH DIFFERENTIAL/PLATELET
Basophils Absolute: 0 10*3/uL (ref 0.0–0.1)
Basophils Relative: 0.4 % (ref 0.0–3.0)
Eosinophils Absolute: 0.1 10*3/uL (ref 0.0–0.7)
Eosinophils Relative: 2.1 % (ref 0.0–5.0)
HCT: 45.6 % (ref 36.0–46.0)
Hemoglobin: 15 g/dL (ref 12.0–15.0)
Lymphocytes Relative: 27.9 % (ref 12.0–46.0)
Lymphs Abs: 1.9 10*3/uL (ref 0.7–4.0)
MCHC: 32.8 g/dL (ref 30.0–36.0)
MCV: 88.3 fl (ref 78.0–100.0)
Monocytes Absolute: 0.5 10*3/uL (ref 0.1–1.0)
Monocytes Relative: 7.4 % (ref 3.0–12.0)
Neutro Abs: 4.3 10*3/uL (ref 1.4–7.7)
Neutrophils Relative %: 62.2 % (ref 43.0–77.0)
Platelets: 256 10*3/uL (ref 150.0–400.0)
RBC: 5.17 Mil/uL — ABNORMAL HIGH (ref 3.87–5.11)
RDW: 13.9 % (ref 11.5–15.5)
WBC: 6.9 10*3/uL (ref 4.0–10.5)

## 2014-10-16 MED ORDER — ZOLPIDEM TARTRATE 5 MG PO TABS: 5.0000 mg | ORAL_TABLET | Freq: Every evening | ORAL | Status: DC | PRN

## 2014-10-16 MED ORDER — BUPROPION HCL ER (XL) 150 MG PO TB24: 150.0000 mg | ORAL_TABLET | Freq: Every day | ORAL | Status: DC

## 2014-10-16 NOTE — Assessment & Plan Note (Signed)
Colonoscopy 12/28/12 - as outlined in overview.  Recommended f/u colonoscopy in 12/2014.  She has f/u scheduled next month with GI.

## 2014-10-16 NOTE — Assessment & Plan Note (Signed)
Physical 05/12/14.  Mammogram 12/23/13 - Birads I.  Scheduled for f/u mammogram.  PAP 05/12/14 - negative with negative HPV.  Due to f/u with GI next month.  Colonoscopy due 12/2014.

## 2014-10-16 NOTE — Assessment & Plan Note (Signed)
On wellbutrin.  Doing better.  Follow.  Using ambien to help sleep.  rx given to pt.

## 2014-10-16 NOTE — Assessment & Plan Note (Signed)
On protonix.  No acid reflux reported.  Follow.  Has f/u with GI next month.

## 2014-10-16 NOTE — Assessment & Plan Note (Signed)
Recheck cbc today.

## 2014-10-16 NOTE — Progress Notes (Signed)
Pre visit review using our clinic review tool, if applicable. No additional management support is needed unless otherwise documented below in the visit note. 

## 2014-10-16 NOTE — Progress Notes (Signed)
Patient ID: Lauren Chang, female   DOB: Aug 11, 1955, 59 y.o.   MRN: [id number]   Subjective:    Patient ID: Lauren Chang, female    DOB: 30-May-1955, 59 y.o.   MRN: [id number]  HPI  Patient here for a scheduled follow up.  Bowels are doing better.  No additional flares since her last visit.  Eating and drinking well.  No acid reflux reported.  Bowels doing well.  Has f/u planned with GI next month.  She is taking wellbutrin.  Stress is better.  Eye twitching resolved.  She is taking 1/2 ambien q hs.  Sleeping better.  Overall feels better.  Weight is down.  States she is eating better.  No nausea or vomiting.  Previous white count slightly elevated.  Discussed recheck.     Past Medical History  Diagnosis Date  . Crohn's disease     large intestine  . Chlamydia   . GERD (gastroesophageal reflux disease)     Outpatient Encounter Prescriptions as of 10/16/2014  Medication Sig  . buPROPion (WELLBUTRIN XL) 150 MG 24 hr tablet Take 1 tablet (150 mg total) by mouth daily.  . Calcium Carbonate-Vitamin D (CALCIUM 600+D) 600-400 MG-UNIT per tablet Take 1 tablet by mouth 2 (two) times daily.  . cholecalciferol (VITAMIN D) 400 UNITS TABS tablet Take 400 Units by mouth 3 (three) times a week.  . fluticasone (FLONASE) 50 MCG/ACT nasal spray Place 2 sprays into both nostrils daily.  . Mesalamine (ASACOL) 400 MG CPDR DR capsule Take 800 mg by mouth 2 (two) times daily.  . pantoprazole (PROTONIX) 40 MG tablet Take 1 tablet (40 mg total) by mouth daily.  Marland Kitchen zolpidem (AMBIEN) 5 MG tablet Take 1 tablet (5 mg total) by mouth at bedtime as needed for sleep.  . [DISCONTINUED] buPROPion (WELLBUTRIN XL) 150 MG 24 hr tablet Take 1 tablet (150 mg total) by mouth daily.  . [DISCONTINUED] zolpidem (AMBIEN) 5 MG tablet Take 1 tablet (5 mg total) by mouth at bedtime as needed for sleep.  . [DISCONTINUED] predniSONE (DELTASONE) 10 MG tablet Take 4 tablets (40 mg total) by mouth daily. For 10 days. Then take 3  tablets for 2 additional days, then 2 tablets for 2 more days, then 1 tablet for 2 additional days and then stop   No facility-administered encounter medications on file as of 10/16/2014.    Review of Systems  Constitutional: Negative for appetite change.       Has adjusted her diet.  Has lost weight.   HENT: Negative for congestion and sinus pressure.   Respiratory: Negative for cough, chest tightness and shortness of breath.   Cardiovascular: Negative for chest pain, palpitations and leg swelling.  Gastrointestinal: Negative for nausea, vomiting, abdominal pain and diarrhea.  Genitourinary: Negative for dysuria and difficulty urinating.  Skin: Negative for color change and rash.  Neurological: Negative for dizziness, light-headedness and headaches.  Psychiatric/Behavioral: Negative for dysphoric mood and agitation.      Objective:    Physical Exam  Constitutional: She appears well-developed and well-nourished. No distress.  HENT:  Nose: Nose normal.  Mouth/Throat: Oropharynx is clear and moist.  Neck: Neck supple. No thyromegaly present.  Cardiovascular: Normal rate and regular rhythm.   Pulmonary/Chest: Breath sounds normal. No respiratory distress. She has no wheezes.  Abdominal: Soft. Bowel sounds are normal. There is no tenderness.  Musculoskeletal: She exhibits no edema or tenderness.  Lymphadenopathy:    She has no cervical adenopathy.  Skin: No rash noted.  No erythema.  Psychiatric: She has a normal mood and affect. Her behavior is normal.    BP 110/82 mmHg  Pulse 76  Temp(Src) 98.5 F (36.9 C) (Oral)  Ht 5' 5"  (1.651 m)  Wt 153 lb (69.4 kg)  BMI 25.46 kg/m2  SpO2 98%  LMP 04/12/2010 Wt Readings from Last 3 Encounters:  10/16/14 153 lb (69.4 kg)  08/18/14 155 lb 8 oz (70.534 kg)  08/12/14 160 lb (72.576 kg)     Lab Results  Component Value Date   WBC 6.9 10/16/2014   HGB 15.0 10/16/2014   HCT 45.6 10/16/2014   PLT 256.0 10/16/2014   GLUCOSE 128*  08/12/2014   CHOL 183 05/12/2014   TRIG 146.0 05/12/2014   HDL 52.10 05/12/2014   LDLCALC 102* 05/12/2014   ALT 18 08/12/2014   AST 26 08/12/2014   NA 137 08/12/2014   K 4.0 08/12/2014   CL 103 08/12/2014   CREATININE 0.77 08/12/2014   BUN 14 08/12/2014   CO2 25 08/12/2014   TSH 0.62 05/12/2014       Assessment & Plan:   Problem List Items Addressed This Visit    Anxiety    On wellbutrin.  Doing better.  Follow.  Using ambien to help sleep.  rx given to pt.        Relevant Medications   buPROPion (WELLBUTRIN XL) 150 MG 24 hr tablet   Crohn's disease    Colonoscopy 12/28/12 - as outlined in overview.  Recommended f/u colonoscopy in 12/2014.  She has f/u scheduled next month with GI.        GERD (gastroesophageal reflux disease)    On protonix.  No acid reflux reported.  Follow.  Has f/u with GI next month.       Health care maintenance    Physical 05/12/14.  Mammogram 12/23/13 - Birads I.  Scheduled for f/u mammogram.  PAP 05/12/14 - negative with negative HPV.  Due to f/u with GI next month.  Colonoscopy due 12/2014.        Leukocytosis - Primary    Recheck cbc today.        Relevant Orders   CBC with Differential/Platelet (Completed)   Osteopenia    Weight bearing exercise, calcium and vitamin D.       Sleeping difficulty    Taking ambien.  Follow.  Tolerating.        Other Visit Diagnoses    Screening breast examination        Relevant Orders    MM DIGITAL SCREENING BILATERAL      I spent 25 minutes with the patient and more than 50% of the time was spent in consultation regarding the above.     [name], MD

## 2014-10-16 NOTE — Assessment & Plan Note (Signed)
Taking ambien.  Follow.  Tolerating.

## 2014-10-16 NOTE — Assessment & Plan Note (Signed)
Weight bearing exercise, calcium and vitamin D.

## 2014-12-25 ENCOUNTER — Ambulatory Visit: Payer: BLUE CROSS/BLUE SHIELD

## 2015-01-01 ENCOUNTER — Ambulatory Visit
Admission: RE | Admit: 2015-01-01 | Discharge: 2015-01-01 | Disposition: A | Discharge status: Home / Self Care | Payer: BLUE CROSS/BLUE SHIELD | Source: Ambulatory Visit | Attending: Internal Medicine | Admitting: Internal Medicine

## 2015-01-01 DIAGNOSIS — Z1231 Encounter for screening mammogram for malignant neoplasm of breast: Secondary | ICD-10-CM | POA: Insufficient documentation

## 2015-01-01 DIAGNOSIS — Z1239 Encounter for other screening for malignant neoplasm of breast: Secondary | ICD-10-CM

## 2015-02-15 ENCOUNTER — Ambulatory Visit (INDEPENDENT_AMBULATORY_CARE_PROVIDER_SITE_OTHER): Payer: BLUE CROSS/BLUE SHIELD | Admitting: Internal Medicine

## 2015-02-15 ENCOUNTER — Telehealth: Payer: Self-pay | Admitting: *Deleted

## 2015-02-15 ENCOUNTER — Encounter: Payer: Self-pay | Admitting: Internal Medicine

## 2015-02-15 VITALS — BP 100/60 | HR 87 | Temp 97.7°F | Resp 18 | Ht 65.0 in | Wt 153.0 lb

## 2015-02-15 DIAGNOSIS — F419 Anxiety disorder, unspecified: Secondary | ICD-10-CM

## 2015-02-15 DIAGNOSIS — D72829 Elevated white blood cell count, unspecified: Secondary | ICD-10-CM | POA: Diagnosis not present

## 2015-02-15 DIAGNOSIS — K50919 Crohn's disease, unspecified, with unspecified complications: Secondary | ICD-10-CM

## 2015-02-15 DIAGNOSIS — K219 Gastro-esophageal reflux disease without esophagitis: Secondary | ICD-10-CM

## 2015-02-15 DIAGNOSIS — Z23 Encounter for immunization: Secondary | ICD-10-CM | POA: Diagnosis not present

## 2015-02-15 MED ORDER — ZOLPIDEM TARTRATE 5 MG PO TABS: 5.0000 mg | ORAL_TABLET | Freq: Every evening | ORAL | Status: DC | PRN

## 2015-02-15 MED ORDER — SERTRALINE HCL 50 MG PO TABS: 50.0000 mg | ORAL_TABLET | Freq: Every day | ORAL | Status: DC

## 2015-02-15 NOTE — Telephone Encounter (Signed)
Please Void previous message, patient accepted another appt.time. Thanks

## 2015-02-15 NOTE — Patient Instructions (Signed)
Influenza Virus Vaccine injection (Fluarix) What is this medicine? INFLUENZA VIRUS VACCINE (in floo EN zuh VAHY ruhs vak SEEN) helps to reduce the risk of getting influenza also known as the flu. This medicine may be used for other purposes; ask your health care provider or pharmacist if you have questions. What should I tell my health care provider before I take this medicine? They need to know if you have any of these conditions: -bleeding disorder like hemophilia -fever or infection -Guillain-Barre syndrome or other neurological problems -immune system problems -infection with the human immunodeficiency virus (HIV) or AIDS -low blood platelet counts -multiple sclerosis -an unusual or allergic reaction to influenza virus vaccine, eggs, chicken proteins, latex, gentamicin, other medicines, foods, dyes or preservatives -pregnant or trying to get pregnant -breast-feeding How should I use this medicine? This vaccine is for injection into a muscle. It is given by a health care professional. A copy of Vaccine Information Statements will be given before each vaccination. Read this sheet carefully each time. The sheet may change frequently. Talk to your pediatrician regarding the use of this medicine in children. Special care may be needed. Overdosage: If you think you have taken too much of this medicine contact a poison control center or emergency room at once. NOTE: This medicine is only for you. Do not share this medicine with others. What if I miss a dose? This does not apply. What may interact with this medicine? -chemotherapy or radiation therapy -medicines that lower your immune system like etanercept, anakinra, infliximab, and adalimumab -medicines that treat or prevent blood clots like warfarin -phenytoin -steroid medicines like prednisone or cortisone -theophylline -vaccines This list may not describe all possible interactions. Give your health care provider a list of all the  medicines, herbs, non-prescription drugs, or dietary supplements you use. Also tell them if you smoke, drink alcohol, or use illegal drugs. Some items may interact with your medicine. What should I watch for while using this medicine? Report any side effects that do not go away within 3 days to your doctor or health care professional. Call your health care provider if any unusual symptoms occur within 6 weeks of receiving this vaccine. You may still catch the flu, but the illness is not usually as bad. You cannot get the flu from the vaccine. The vaccine will not protect against colds or other illnesses that may cause fever. The vaccine is needed every year. What side effects may I notice from receiving this medicine? Side effects that you should report to your doctor or health care professional as soon as possible: -allergic reactions like skin rash, itching or hives, swelling of the face, lips, or tongue Side effects that usually do not require medical attention (report to your doctor or health care professional if they continue or are bothersome): -fever -headache -muscle aches and pains -pain, tenderness, redness, or swelling at site where injected -weak or tired This list may not describe all possible side effects. Call your doctor for medical advice about side effects. You may report side effects to FDA at 1-800-FDA-1088. Where should I keep my medicine? This vaccine is only given in a clinic, pharmacy, doctor's office, or other health care setting and will not be stored at home. NOTE: This sheet is a summary. It may not cover all possible information. If you have questions about this medicine, talk to your doctor, pharmacist, or health care provider.    2016, [employer] Standard. (2007-09-22 09:30:40)

## 2015-02-15 NOTE — Progress Notes (Signed)
Pre-visit discussion using our clinic review tool. No additional management support is needed unless otherwise documented below in the visit note.  

## 2015-02-15 NOTE — Progress Notes (Signed)
Patient ID: Lauren Chang, female   DOB: 04/02/1955, 59 y.o.   MRN: [id number]   Subjective:    Patient ID: Lauren Chang, female    DOB: 11/19/1955, 59 y.o.   MRN: [id number]  HPI  Patient with past history of Crohn's disease and GERD.  She comes in today to follow up on these issues.  She has been under increased stress with her husband's health issues.  We discussed this today.  She has restarted wellbutrin.  Just started three weeks ago.  Feels needs to increase the dose.  Increased stress and some mild depression.  We discussed changing the wellbutrin to a different medication.  Tries to stay active.  No cardiac symptoms with increased activity or exertion.  No sob.  No abdominal pain or cramping.  Bowels stable.  Followed by GI.     Past Medical History  Diagnosis Date  . Crohn's disease ([place])     large intestine  . Chlamydia   . GERD (gastroesophageal reflux disease)    Past Surgical History  Procedure Laterality Date  . Laparoscopy and tuba repair    . Tonsilectomy/adenoidectomy with myringotomy    . Orif right calcaneus     Family History  Problem Relation Age of Onset  . Lymphoma Father   . Asthma Mother   . Osteoporosis Mother   . Mental illness Brother     suicide  . Pancreatic cancer      uncle  . COPD Maternal Grandmother   . Breast cancer Neg Hx   . Colon cancer Neg Hx    Social History   Social History  . Marital Status: Married    Spouse Name: N/A  . Number of Children: 0  . Years of Education: N/A   Social History Main Topics  . Smoking status: Former Smoker    Quit date: 03/11/1999  . Smokeless tobacco: Never Used  . Alcohol Use: No  . Drug Use: No  . Sexual Activity: Not Asked   Other Topics Concern  . None   Social History Narrative    Outpatient Encounter Prescriptions as of 02/15/2015  Medication Sig  . Calcium Carbonate-Vitamin D (CALCIUM 600+D) 600-400 MG-UNIT per tablet Take 1 tablet by mouth 2 (two) times daily.  .  cholecalciferol (VITAMIN D) 400 UNITS TABS tablet Take 400 Units by mouth 3 (three) times a week.  . fluticasone (FLONASE) 50 MCG/ACT nasal spray Place 2 sprays into both nostrils daily.  . Mesalamine (ASACOL) 400 MG CPDR DR capsule Take 800 mg by mouth 2 (two) times daily.  . pantoprazole (PROTONIX) 40 MG tablet Take 1 tablet (40 mg total) by mouth daily.  Marland Kitchen zolpidem (AMBIEN) 5 MG tablet Take 1 tablet (5 mg total) by mouth at bedtime as needed for sleep.  . [DISCONTINUED] buPROPion (WELLBUTRIN XL) 150 MG 24 hr tablet Take 1 tablet (150 mg total) by mouth daily.  . [DISCONTINUED] zolpidem (AMBIEN) 5 MG tablet Take 1 tablet (5 mg total) by mouth at bedtime as needed for sleep.  Marland Kitchen sertraline (ZOLOFT) 50 MG tablet Take 1 tablet (50 mg total) by mouth daily.   No facility-administered encounter medications on file as of 02/15/2015.    Review of Systems  Constitutional: Negative for appetite change and unexpected weight change.  HENT: Negative for congestion and sinus pressure.   Eyes: Negative for discharge and redness.  Respiratory: Negative for cough, chest tightness and shortness of breath.   Cardiovascular: Negative for chest pain, palpitations and leg  swelling.  Gastrointestinal: Negative for nausea, vomiting, abdominal pain and diarrhea.  Genitourinary: Negative for dysuria and difficulty urinating.  Musculoskeletal: Negative for back pain and joint swelling.  Skin: Negative for color change and rash.  Neurological: Negative for dizziness, light-headedness and headaches.  Psychiatric/Behavioral:       Increased stress and mild depression as outlined.  Discussed treatment recommendations.         Objective:    Physical Exam  Constitutional: She appears well-developed and well-nourished. No distress.  HENT:  Nose: Nose normal.  Mouth/Throat: Oropharynx is clear and moist.  Eyes: Conjunctivae are normal. Right eye exhibits no discharge. Left eye exhibits no discharge.  Neck: Neck  supple. No thyromegaly present.  Cardiovascular: Normal rate and regular rhythm.   Pulmonary/Chest: Breath sounds normal. No respiratory distress. She has no wheezes.  Abdominal: Soft. Bowel sounds are normal. There is no tenderness.  Musculoskeletal: She exhibits no edema or tenderness.  Lymphadenopathy:    She has no cervical adenopathy.  Skin: No rash noted. No erythema.  Psychiatric: She has a normal mood and affect. Her behavior is normal.    BP 100/60 mmHg  Pulse 87  Temp(Src) 97.7 F (36.5 C) (Oral)  Resp 18  Ht 5' 5"  (1.651 m)  Wt 153 lb (69.4 kg)  BMI 25.46 kg/m2  SpO2 96%  LMP 04/12/2010 Wt Readings from Last 3 Encounters:  02/15/15 153 lb (69.4 kg)  10/16/14 153 lb (69.4 kg)  08/18/14 155 lb 8 oz (70.534 kg)     Lab Results  Component Value Date   WBC 6.9 10/16/2014   HGB 15.0 10/16/2014   HCT 45.6 10/16/2014   PLT 256.0 10/16/2014   GLUCOSE 128* 08/12/2014   CHOL 183 05/12/2014   TRIG 146.0 05/12/2014   HDL 52.10 05/12/2014   LDLCALC 102* 05/12/2014   ALT 18 08/12/2014   AST 26 08/12/2014   NA 137 08/12/2014   K 4.0 08/12/2014   CL 103 08/12/2014   CREATININE 0.77 08/12/2014   BUN 14 08/12/2014   CO2 25 08/12/2014   TSH 0.62 05/12/2014    Mm Digital Screening Bilateral  01/01/2015  CLINICAL DATA:  Screening. EXAM: DIGITAL SCREENING BILATERAL MAMMOGRAM WITH CAD COMPARISON:  Previous exam(s). ACR Breast Density Category c: The breast tissue is heterogeneously dense, which may obscure small masses. FINDINGS: There are no findings suspicious for malignancy. Images were processed with CAD. IMPRESSION: No mammographic evidence of malignancy. A result letter of this screening mammogram will be mailed directly to the patient. RECOMMENDATION: Screening mammogram in one year. (Code:SM-B-01Y) BI-RADS CATEGORY  1: Negative. Electronically Signed   By: Altamese Cabal M.D.   On: 01/01/2015 09:49       Assessment & Plan:   Problem List Items Addressed This  Visit    Anxiety    On wellbutrin.  Increased stress and mild depression.  Just restarted.  Will change to zoloft.  Taper wellbutrin as directed.  Follow closely.  Get her back in soon to reassess.  She does not feel needs any further intervention at this time.        Relevant Medications   sertraline (ZOLOFT) 50 MG tablet   Crohn's disease ([place])    Colonoscopy 12/28/12 as outlined.  Recommended f/u in two years.  Seeing GI.        GERD (gastroesophageal reflux disease)    On protonix.  No acid reflux.  Follow.  Followed by GI.        Leukocytosis  Follow cbc.         Other Visit Diagnoses    Encounter for immunization    -  Primary        [name], MD

## 2015-02-15 NOTE — Telephone Encounter (Signed)
Patient was advise to be seen in  6 weeks. Patient has vacation time the week of Jan 16,2016. Please advise a place on the Schedule.

## 2015-02-18 ENCOUNTER — Encounter: Payer: Self-pay | Admitting: Internal Medicine

## 2015-02-18 NOTE — Assessment & Plan Note (Signed)
On wellbutrin.  Increased stress and mild depression.  Just restarted.  Will change to zoloft.  Taper wellbutrin as directed.  Follow closely.  Get her back in soon to reassess.  She does not feel needs any further intervention at this time.

## 2015-02-18 NOTE — Assessment & Plan Note (Signed)
On protonix.  No acid reflux.  Follow.  Followed by GI.

## 2015-02-18 NOTE — Assessment & Plan Note (Signed)
Colonoscopy 12/28/12 as outlined.  Recommended f/u in two years.  Seeing GI.

## 2015-02-18 NOTE — Assessment & Plan Note (Signed)
Follow cbc.  

## 2015-04-06 ENCOUNTER — Encounter: Payer: Self-pay | Admitting: Internal Medicine

## 2015-04-06 ENCOUNTER — Ambulatory Visit (INDEPENDENT_AMBULATORY_CARE_PROVIDER_SITE_OTHER): Payer: BLUE CROSS/BLUE SHIELD | Admitting: Internal Medicine

## 2015-04-06 VITALS — BP 110/70 | HR 93 | Temp 97.9°F | Resp 18 | Ht 65.0 in | Wt 146.2 lb

## 2015-04-06 DIAGNOSIS — K219 Gastro-esophageal reflux disease without esophagitis: Secondary | ICD-10-CM | POA: Diagnosis not present

## 2015-04-06 DIAGNOSIS — F419 Anxiety disorder, unspecified: Secondary | ICD-10-CM | POA: Diagnosis not present

## 2015-04-06 DIAGNOSIS — G479 Sleep disorder, unspecified: Secondary | ICD-10-CM | POA: Diagnosis not present

## 2015-04-06 DIAGNOSIS — K50919 Crohn's disease, unspecified, with unspecified complications: Secondary | ICD-10-CM

## 2015-04-06 MED ORDER — AMITRIPTYLINE HCL 10 MG PO TABS
10.0000 mg | ORAL_TABLET | Freq: Every day | ORAL | Status: DC
Start: 2015-04-06 — End: 2015-06-15

## 2015-04-06 MED ORDER — BUPROPION HCL ER (XL) 150 MG PO TB24: 150.0000 mg | ORAL_TABLET | Freq: Every day | ORAL | Status: DC

## 2015-04-06 NOTE — Progress Notes (Signed)
Pre-visit discussion using our clinic review tool. No additional management support is needed unless otherwise documented below in the visit note.  

## 2015-04-06 NOTE — Progress Notes (Signed)
Patient ID: Lauren Chang, female   DOB: Sep 20, 1955, 61 y.o.   MRN: [id number]   Subjective:    Patient ID: Lauren Chang, female    DOB: [birth year], 60 y.o.   MRN: [id number]  HPI  Patient with past history of Crohn's disease and GERD.  She comes in today to follow up on these issues.  Recent diarrhea.  Was seen at Urgent Care and subsequently by GI.  Felt to be a flare of her Crohn's.  On prednisone now.  She is still having some abdominal discomfort.  Is better.  Bowels are better.  Just taking longer to completely resolve. Was having 8-12 stools per day.  No having 2-3 stools per day.  No nausea or vomiting.  Is eating.  Has f/u planned with GI on 04/12/15.  She is off zoloft.  Felt this was aggravating her bowels.  Would like to be back on her wellbutrin.  Also would like to take a low dose amitriptyline to help with her sleep and her bowels.  Discussed stopping ambien.  No chest pain or tightness.  No sob.    Past Medical History  Diagnosis Date  . Crohn's disease ([place])     large intestine  . Chlamydia   . GERD (gastroesophageal reflux disease)    Past Surgical History  Procedure Laterality Date  . Laparoscopy and tuba repair    . Tonsilectomy/adenoidectomy with myringotomy    . Orif right calcaneus     Family History  Problem Relation Age of Onset  . Lymphoma Father   . Asthma Mother   . Osteoporosis Mother   . Mental illness Brother     suicide  . Pancreatic cancer      uncle  . COPD Maternal Grandmother   . Breast cancer Neg Hx   . Colon cancer Neg Hx    Social History   Social History  . Marital Status: Married    Spouse Name: N/A  . Number of Children: 0  . Years of Education: N/A   Social History Main Topics  . Smoking status: Former Smoker    Quit date: 03/11/1999  . Smokeless tobacco: Never Used  . Alcohol Use: No  . Drug Use: No  . Sexual Activity: Not Asked   Other Topics Concern  . None   Social History Narrative    Outpatient Encounter  Prescriptions as of 04/06/2015  Medication Sig  . Calcium Carbonate-Vitamin D (CALCIUM 600+D) 600-400 MG-UNIT per tablet Take 1 tablet by mouth 2 (two) times daily.  . cholecalciferol (VITAMIN D) 400 UNITS TABS tablet Take 400 Units by mouth 3 (three) times a week.  . fluticasone (FLONASE) 50 MCG/ACT nasal spray Place 2 sprays into both nostrils daily.  . Mesalamine (ASACOL) 400 MG CPDR DR capsule Take 800 mg by mouth 2 (two) times daily.  . pantoprazole (PROTONIX) 40 MG tablet Take 1 tablet (40 mg total) by mouth daily.  . predniSONE (DELTASONE) 10 MG tablet Take by mouth.  . [DISCONTINUED] sertraline (ZOLOFT) 50 MG tablet Take 1 tablet (50 mg total) by mouth daily.  . [DISCONTINUED] zolpidem (AMBIEN) 5 MG tablet Take 1 tablet (5 mg total) by mouth at bedtime as needed for sleep.  Marland Kitchen amitriptyline (ELAVIL) 10 MG tablet Take 1 tablet (10 mg total) by mouth at bedtime.  Marland Kitchen buPROPion (WELLBUTRIN XL) 150 MG 24 hr tablet Take 1 tablet (150 mg total) by mouth daily.   No facility-administered encounter medications on file as of 04/06/2015.  Review of Systems  Constitutional:       Appetite improving now.  Weight is down.Marland Kitchen    HENT: Negative for congestion and sinus pressure.   Eyes: Negative for discharge and redness.  Respiratory: Negative for cough, chest tightness and shortness of breath.   Cardiovascular: Negative for chest pain, palpitations and leg swelling.  Gastrointestinal: Positive for abdominal pain and diarrhea. Negative for nausea and vomiting.  Genitourinary: Negative for dysuria and difficulty urinating.  Musculoskeletal: Negative for back pain and joint swelling.  Skin: Negative for color change and rash.  Neurological: Negative for dizziness, light-headedness and headaches.  Psychiatric/Behavioral: Positive for sleep disturbance. Negative for dysphoric mood and agitation.       Objective:    Physical Exam  Constitutional: She appears well-developed and well-nourished. No  distress.  HENT:  Nose: Nose normal.  Mouth/Throat: Oropharynx is clear and moist.  Eyes: Conjunctivae are normal. Right eye exhibits no discharge. Left eye exhibits no discharge.  Neck: Neck supple. No thyromegaly present.  Cardiovascular: Normal rate and regular rhythm.   Pulmonary/Chest: Breath sounds normal. No respiratory distress. She has no wheezes.  Abdominal: Soft. Bowel sounds are normal.  Minimal tenderness to palpation.    Musculoskeletal: She exhibits no edema or tenderness.  Lymphadenopathy:    She has no cervical adenopathy.  Skin: No rash noted. No erythema.  Psychiatric: She has a normal mood and affect. Her behavior is normal.    BP 110/70 mmHg  Pulse 93  Temp(Src) 97.9 F (36.6 C) (Oral)  Resp 18  Ht 5' 5"  (1.651 m)  Wt 146 lb 4 oz (66.339 kg)  BMI 24.34 kg/m2  SpO2 97%  LMP 04/12/2010 Wt Readings from Last 3 Encounters:  04/06/15 146 lb 4 oz (66.339 kg)  02/15/15 153 lb (69.4 kg)  10/16/14 153 lb (69.4 kg)     Lab Results  Component Value Date   WBC 6.9 10/16/2014   HGB 15.0 10/16/2014   HCT 45.6 10/16/2014   PLT 256.0 10/16/2014   GLUCOSE 128* 08/12/2014   CHOL 183 05/12/2014   TRIG 146.0 05/12/2014   HDL 52.10 05/12/2014   LDLCALC 102* 05/12/2014   ALT 18 08/12/2014   AST 26 08/12/2014   NA 137 08/12/2014   K 4.0 08/12/2014   CL 103 08/12/2014   CREATININE 0.77 08/12/2014   BUN 14 08/12/2014   CO2 25 08/12/2014   TSH 0.62 05/12/2014    Mm Digital Screening Bilateral  01/01/2015  CLINICAL DATA:  Screening. EXAM: DIGITAL SCREENING BILATERAL MAMMOGRAM WITH CAD COMPARISON:  Previous exam(s). ACR Breast Density Category c: The breast tissue is heterogeneously dense, which may obscure small masses. FINDINGS: There are no findings suspicious for malignancy. Images were processed with CAD. IMPRESSION: No mammographic evidence of malignancy. A result letter of this screening mammogram will be mailed directly to the patient. RECOMMENDATION:  Screening mammogram in one year. (Code:SM-B-01Y) BI-RADS CATEGORY  1: Negative. Electronically Signed   By: Altamese Cabal M.D.   On: 01/01/2015 09:49       Assessment & Plan:   Problem List Items Addressed This Visit    Anxiety    Off zoloft.  Did well with wellbutrin previously.  Wants to restart.  Will restart as outlined.  Follow.  Get her back in soon to reassess.  Notify me if any worsening problems.        Relevant Medications   buPROPion (WELLBUTRIN XL) 150 MG 24 hr tablet   amitriptyline (ELAVIL) 10 MG tablet  Crohn's disease ([place])    Saw GI.  Felt symptoms c/w flare of her Crohn's.  On prednisone.  Still with some abdominal discomfort and loose stool.  Is better.  Discussed CT.  Wants to hold on scanning since better.  Keep appt with GI next week.  Follow.  Continue prednisone and asacol.  Follow.        GERD (gastroesophageal reflux disease) - Primary    On protonix.  Followed by GI.  No upper symptoms reported.       Sleeping difficulty    Will stop ambien.  Start amitriptyline 39m q hs.  Follow.  Discussed possible side effects.            SEinar Pheasant MD

## 2015-04-08 ENCOUNTER — Encounter: Payer: Self-pay | Admitting: Internal Medicine

## 2015-04-08 NOTE — Assessment & Plan Note (Signed)
Saw GI.  Felt symptoms c/w flare of her Crohn's.  On prednisone.  Still with some abdominal discomfort and loose stool.  Is better.  Discussed CT.  Wants to hold on scanning since better.  Keep appt with GI next week.  Follow.  Continue prednisone and asacol.  Follow.

## 2015-04-08 NOTE — Assessment & Plan Note (Signed)
On protonix.  Followed by GI.  No upper symptoms reported.

## 2015-04-08 NOTE — Assessment & Plan Note (Signed)
Will stop ambien.  Start amitriptyline 43m q hs.  Follow.  Discussed possible side effects.

## 2015-04-08 NOTE — Assessment & Plan Note (Signed)
Off zoloft.  Did well with wellbutrin previously.  Wants to restart.  Will restart as outlined.  Follow.  Get her back in soon to reassess.  Notify me if any worsening problems.

## 2015-06-15 ENCOUNTER — Ambulatory Visit (INDEPENDENT_AMBULATORY_CARE_PROVIDER_SITE_OTHER): Payer: BLUE CROSS/BLUE SHIELD | Admitting: Internal Medicine

## 2015-06-15 ENCOUNTER — Encounter: Payer: Self-pay | Admitting: Internal Medicine

## 2015-06-15 VITALS — BP 130/80 | HR 77 | Temp 97.9°F | Resp 18 | Ht 65.0 in | Wt 150.5 lb

## 2015-06-15 DIAGNOSIS — K219 Gastro-esophageal reflux disease without esophagitis: Secondary | ICD-10-CM

## 2015-06-15 DIAGNOSIS — F419 Anxiety disorder, unspecified: Secondary | ICD-10-CM | POA: Diagnosis not present

## 2015-06-15 DIAGNOSIS — K50919 Crohn's disease, unspecified, with unspecified complications: Secondary | ICD-10-CM

## 2015-06-15 DIAGNOSIS — M8580 Other specified disorders of bone density and structure, unspecified site: Secondary | ICD-10-CM

## 2015-06-15 DIAGNOSIS — G479 Sleep disorder, unspecified: Secondary | ICD-10-CM

## 2015-06-15 MED ORDER — BUPROPION HCL ER (XL) 150 MG PO TB24: 150.0000 mg | ORAL_TABLET | Freq: Every day | ORAL | Status: DC

## 2015-06-15 MED ORDER — ZOLPIDEM TARTRATE 5 MG PO TABS: ORAL_TABLET | ORAL | Status: DC

## 2015-06-15 NOTE — Progress Notes (Signed)
Pre-visit discussion using our clinic review tool. No additional management support is needed unless otherwise documented below in the visit note.  

## 2015-06-15 NOTE — Progress Notes (Signed)
Patient ID: Lauren Chang, female   DOB: [birth year], 60 y.o.   MRN: [id number]   Subjective:    Patient ID: Lauren Chang, female    DOB: 25-Apr-1955, 60 y.o.   MRN: [id number]  HPI  Patient here for a scheduled follow up.  Had a recent crohn's flare.  Better now.  Pain resolved.  Bowels doing well.  No chest pain or tightness.  No sob.  No acid reflux.  No abdominal pain or cramping.  Bowels stable.  Handling stress.  Discussed her husband's health issues.  She is taking ambien to help her sleep.  Did not tolerate amitriptyline.  On wellbutrin and doing well.    Past Medical History  Diagnosis Date  . Crohn's disease ([place])     large intestine  . Chlamydia   . GERD (gastroesophageal reflux disease)    Past Surgical History  Procedure Laterality Date  . Laparoscopy and tuba repair    . Tonsilectomy/adenoidectomy with myringotomy    . Orif right calcaneus     Family History  Problem Relation Age of Onset  . Lymphoma Father   . Asthma Mother   . Osteoporosis Mother   . Mental illness Brother     suicide  . Pancreatic cancer      uncle  . COPD Maternal Grandmother   . Breast cancer Neg Hx   . Colon cancer Neg Hx    Social History   Social History  . Marital Status: Married    Spouse Name: N/A  . Number of Children: 0  . Years of Education: N/A   Social History Main Topics  . Smoking status: Former Smoker    Quit date: 03/11/1999  . Smokeless tobacco: Never Used  . Alcohol Use: No  . Drug Use: No  . Sexual Activity: Not Asked   Other Topics Concern  . None   Social History Narrative    Outpatient Encounter Prescriptions as of 06/15/2015  Medication Sig  . buPROPion (WELLBUTRIN XL) 150 MG 24 hr tablet Take 1 tablet (150 mg total) by mouth daily.  . Calcium Carbonate-Vitamin D (CALCIUM 600+D) 600-400 MG-UNIT per tablet Take 1 tablet by mouth 2 (two) times daily.  . cholecalciferol (VITAMIN D) 400 UNITS TABS tablet Take 400 Units by mouth 3 (three) times a  week.  . fluticasone (FLONASE) 50 MCG/ACT nasal spray Place 2 sprays into both nostrils daily.  . Mesalamine (ASACOL) 400 MG CPDR DR capsule Take 800 mg by mouth 2 (two) times daily.  . pantoprazole (PROTONIX) 40 MG tablet Take 1 tablet (40 mg total) by mouth daily.  . [DISCONTINUED] amitriptyline (ELAVIL) 10 MG tablet Take 1 tablet (10 mg total) by mouth at bedtime.  . [DISCONTINUED] buPROPion (WELLBUTRIN XL) 150 MG 24 hr tablet Take 1 tablet (150 mg total) by mouth daily.  . [DISCONTINUED] predniSONE (DELTASONE) 10 MG tablet Take by mouth.  . zolpidem (AMBIEN) 5 MG tablet 1/2 - 1 tablet q hs prn   No facility-administered encounter medications on file as of 06/15/2015.    Review of Systems  Constitutional: Negative for appetite change and unexpected weight change.  HENT: Negative for congestion and sinus pressure.   Respiratory: Negative for cough, chest tightness and shortness of breath.   Cardiovascular: Negative for chest pain, palpitations and leg swelling.  Gastrointestinal: Negative for nausea, vomiting, abdominal pain and diarrhea.  Genitourinary: Negative for dysuria and difficulty urinating.  Musculoskeletal: Negative for myalgias, back pain and joint swelling.  Skin: Negative for  color change and rash.  Neurological: Negative for dizziness, light-headedness and headaches.  Psychiatric/Behavioral: Negative for dysphoric mood and agitation.       Objective:    Physical Exam  Constitutional: She appears well-developed and well-nourished. No distress.  HENT:  Nose: Nose normal.  Mouth/Throat: Oropharynx is clear and moist.  Neck: Neck supple. No thyromegaly present.  Cardiovascular: Normal rate and regular rhythm.   Pulmonary/Chest: Breath sounds normal. No respiratory distress. She has no wheezes.  Abdominal: Soft. Bowel sounds are normal. There is no tenderness.  Musculoskeletal: She exhibits no edema or tenderness.  Lymphadenopathy:    She has no cervical adenopathy.    Skin: No rash noted. No erythema.  Psychiatric: She has a normal mood and affect. Her behavior is normal.    BP 130/80 mmHg  Pulse 77  Temp(Src) 97.9 F (36.6 C) (Oral)  Resp 18  Ht 5' 5"  (1.651 m)  Wt 150 lb 8 oz (68.266 kg)  BMI 25.04 kg/m2  SpO2 96%  LMP 04/12/2010 Wt Readings from Last 3 Encounters:  06/15/15 150 lb 8 oz (68.266 kg)  04/06/15 146 lb 4 oz (66.339 kg)  02/15/15 153 lb (69.4 kg)     Lab Results  Component Value Date   WBC 6.9 10/16/2014   HGB 15.0 10/16/2014   HCT 45.6 10/16/2014   PLT 256.0 10/16/2014   GLUCOSE 128* 08/12/2014   CHOL 183 05/12/2014   TRIG 146.0 05/12/2014   HDL 52.10 05/12/2014   LDLCALC 102* 05/12/2014   ALT 18 08/12/2014   AST 26 08/12/2014   NA 137 08/12/2014   K 4.0 08/12/2014   CL 103 08/12/2014   CREATININE 0.77 08/12/2014   BUN 14 08/12/2014   CO2 25 08/12/2014   TSH 0.62 05/12/2014    Mm Digital Screening Bilateral  01/01/2015  CLINICAL DATA:  Screening. EXAM: DIGITAL SCREENING BILATERAL MAMMOGRAM WITH CAD COMPARISON:  Previous exam(s). ACR Breast Density Category c: The breast tissue is heterogeneously dense, which may obscure small masses. FINDINGS: There are no findings suspicious for malignancy. Images were processed with CAD. IMPRESSION: No mammographic evidence of malignancy. A result letter of this screening mammogram will be mailed directly to the patient. RECOMMENDATION: Screening mammogram in one year. (Code:SM-B-01Y) BI-RADS CATEGORY  1: Negative. Electronically Signed   By: Altamese Cabal M.D.   On: 01/01/2015 09:49       Assessment & Plan:   Problem List Items Addressed This Visit    Anxiety    On wellbutrin and doing better.  Continue.  Using ambien to help her sleep.  Follow.        Relevant Medications   buPROPion (WELLBUTRIN XL) 150 MG 24 hr tablet   Crohn's disease ([place])    Recent flare.  Better now.  No pain.  Bowels stable.  Followed by GI.        GERD (gastroesophageal reflux disease) -  Primary    On protonix.  Upper symptoms controlled.       Sleeping difficulty    Doing well on ambien.            [name], MD

## 2015-06-16 ENCOUNTER — Encounter: Payer: Self-pay | Admitting: Internal Medicine

## 2015-06-16 NOTE — Assessment & Plan Note (Signed)
Recent flare.  Better now.  No pain.  Bowels stable.  Followed by GI.

## 2015-06-16 NOTE — Assessment & Plan Note (Signed)
Doing well on ambien.

## 2015-06-16 NOTE — Assessment & Plan Note (Signed)
On wellbutrin and doing better.  Continue.  Using ambien to help her sleep.  Follow.

## 2015-06-16 NOTE — Assessment & Plan Note (Signed)
On protonix.  Upper symptoms controlled.

## 2015-07-09 ENCOUNTER — Other Ambulatory Visit: Payer: Self-pay | Admitting: Internal Medicine

## 2015-07-10 ENCOUNTER — Telehealth: Payer: Self-pay | Admitting: Internal Medicine

## 2015-07-10 NOTE — Telephone Encounter (Signed)
Faxed Prescription to her number provided to have her labs drawn. thanks

## 2015-07-10 NOTE — Telephone Encounter (Signed)
I do not know how to print out the orders.  I wrote them on rx pad.  She can pick up.  Placed on your desk.

## 2015-07-10 NOTE — Telephone Encounter (Signed)
Left the Patient a detailed message that she can have them drawn there, but will need to have them faxed to [place] for resulting. thanks

## 2015-07-10 NOTE — Telephone Encounter (Signed)
Patient is requesting to complete at [facility], can you print out and I will fax to her as requested. Thanks

## 2015-07-10 NOTE — Telephone Encounter (Signed)
I do not mind giving her an order for the labs to be drawn there, if they agree to draw them.  Results will need to be faxed to me.  Thanks

## 2015-07-10 NOTE — Telephone Encounter (Signed)
Pt called wanting to know if she can pick her lab orders to get labs done at [facility] which is more convenient for her. She has a appt there on Thursday @ 8:30am. Call pt @ [phone]. Thank you!

## 2015-07-10 NOTE — Telephone Encounter (Signed)
Please advise if her future labs can be done at [place]? Thanks

## 2015-07-10 NOTE — Telephone Encounter (Signed)
Patient questioned if the  List of labs could be faxed to her at her work site [employer] [place]

## 2015-08-21 ENCOUNTER — Telehealth: Payer: Self-pay | Admitting: Internal Medicine

## 2015-08-21 NOTE — Telephone Encounter (Signed)
Pt called about needing to get labs done. Pt would like lab orders to be sent to [facility] in [place].   Call pt @ [phone]. Thank you!

## 2015-08-21 NOTE — Telephone Encounter (Signed)
Patient would like labs done in [place], please advise, I see future orders for vitamin D, BMP, Hepatic Function, and lipid panel.  Please advise and order on [facility] slip, thanks.

## 2015-08-21 NOTE — Telephone Encounter (Signed)
Order form placed on your desk.

## 2015-08-22 NOTE — Telephone Encounter (Signed)
Order faxed to [place], [place] ([facility] location)-pt notified

## 2015-08-24 ENCOUNTER — Other Ambulatory Visit: Payer: Self-pay | Admitting: Internal Medicine

## 2015-08-25 LAB — LIPID PANEL W/O CHOL/HDL RATIO
Cholesterol, Total: 202 mg/dL — ABNORMAL HIGH (ref 100–199)
HDL: 61 mg/dL (ref >39)
LDL Calculated: 123 mg/dL — ABNORMAL HIGH (ref 0–99)
Triglycerides: 88 mg/dL (ref 0–149)
VLDL Cholesterol Cal: 18 mg/dL (ref 5–40)

## 2015-08-25 LAB — BASIC METABOLIC PANEL
BUN/Creatinine Ratio: 19 (ref 9–23)
BUN: 14 mg/dL (ref 6–24)
CO2: 25 mmol/L (ref 18–29)
Calcium: 9.9 mg/dL (ref 8.7–10.2)
Chloride: 96 mmol/L (ref 96–106)
Creatinine, Ser: 0.75 mg/dL (ref 0.57–1.00)
GFR calc Af Amer: 101 mL/min/{1.73_m2} (ref >59)
GFR calc non Af Amer: 88 mL/min/{1.73_m2} (ref >59)
Glucose: 92 mg/dL (ref 65–99)
Potassium: 4.6 mmol/L (ref 3.5–5.2)
Sodium: 138 mmol/L (ref 134–144)

## 2015-08-25 LAB — HEPATIC FUNCTION PANEL
ALT: 14 IU/L (ref 0–32)
AST: 20 IU/L (ref 0–40)
Albumin: 4.7 g/dL (ref 3.5–5.5)
Alkaline Phosphatase: 83 IU/L (ref 39–117)
Bilirubin Total: 0.4 mg/dL (ref 0.0–1.2)
Bilirubin, Direct: 0.11 mg/dL (ref 0.00–0.40)
Total Protein: 7.3 g/dL (ref 6.0–8.5)

## 2015-08-25 LAB — VITAMIN D 25 HYDROXY (VIT D DEFICIENCY, FRACTURES): Vit D, 25-Hydroxy: 46.2 ng/mL (ref 30.0–100.0)

## 2015-08-28 ENCOUNTER — Encounter: Payer: Self-pay | Admitting: *Deleted

## 2015-09-19 ENCOUNTER — Ambulatory Visit (INDEPENDENT_AMBULATORY_CARE_PROVIDER_SITE_OTHER): Payer: BLUE CROSS/BLUE SHIELD | Admitting: Internal Medicine

## 2015-09-19 ENCOUNTER — Encounter: Payer: Self-pay | Admitting: Internal Medicine

## 2015-09-19 VITALS — BP 110/60 | HR 102 | Temp 98.0°F | Resp 18 | Ht 65.0 in | Wt 151.5 lb

## 2015-09-19 DIAGNOSIS — K50919 Crohn's disease, unspecified, with unspecified complications: Secondary | ICD-10-CM

## 2015-09-19 DIAGNOSIS — M8580 Other specified disorders of bone density and structure, unspecified site: Secondary | ICD-10-CM

## 2015-09-19 DIAGNOSIS — D72829 Elevated white blood cell count, unspecified: Secondary | ICD-10-CM

## 2015-09-19 DIAGNOSIS — F419 Anxiety disorder, unspecified: Secondary | ICD-10-CM

## 2015-09-19 DIAGNOSIS — Z1239 Encounter for other screening for malignant neoplasm of breast: Secondary | ICD-10-CM | POA: Diagnosis not present

## 2015-09-19 DIAGNOSIS — Z0000 Encounter for general adult medical examination without abnormal findings: Secondary | ICD-10-CM

## 2015-09-19 DIAGNOSIS — G479 Sleep disorder, unspecified: Secondary | ICD-10-CM

## 2015-09-19 DIAGNOSIS — K219 Gastro-esophageal reflux disease without esophagitis: Secondary | ICD-10-CM

## 2015-09-19 MED ORDER — BUPROPION HCL ER (XL) 300 MG PO TB24: 300.0000 mg | ORAL_TABLET | Freq: Every day | ORAL | Status: DC

## 2015-09-19 MED ORDER — ZOLPIDEM TARTRATE 5 MG PO TABS: ORAL_TABLET | ORAL | Status: DC

## 2015-09-19 NOTE — Assessment & Plan Note (Addendum)
Physical today 09/19/15.  PAP 05/22/14 - negative with negative HPV.  Colonoscopy - states had within last year.  Need results.  Schedule mammogram when due.  Last mammogram 01/01/15 - [facility].

## 2015-09-19 NOTE — Progress Notes (Signed)
Patient ID: RYLEN HOU, female   DOB: 05-14-1955, 60 y.o.   MRN: [id number]   Subjective:    Patient ID: Lauren Chang, female    DOB: 01/13/1956, 60 y.o.   MRN: [id number]  HPI  Patient here for her physical exam.   Still with increased stress.  Still requiring ambien to sleep most nights.  Increased stress with her job.  Increased stress with her husband's health issues.  We discussed today.  Feels she may need to increase the wellbutrin.  Does feel is helping.  She tries to stay active.  No cardiac symptoms with increased activity or exertion.  No sob.  No acid reflux. She feels bowels are stable.  No significant pain.     Past Medical History  Diagnosis Date  . Crohn's disease ([place])     large intestine  . Chlamydia   . GERD (gastroesophageal reflux disease)    Past Surgical History  Procedure Laterality Date  . Laparoscopy and tuba repair    . Tonsilectomy/adenoidectomy with myringotomy    . Orif right calcaneus     Family History  Problem Relation Age of Onset  . Lymphoma Father   . Asthma Mother   . Osteoporosis Mother   . Mental illness Brother     suicide  . Pancreatic cancer      uncle  . COPD Maternal Grandmother   . Breast cancer Neg Hx   . Colon cancer Neg Hx    Social History   Social History  . Marital Status: Married    Spouse Name: N/A  . Number of Children: 0  . Years of Education: N/A   Social History Main Topics  . Smoking status: Former Smoker    Quit date: 03/11/1999  . Smokeless tobacco: Never Used  . Alcohol Use: No  . Drug Use: No  . Sexual Activity: Not Asked   Other Topics Concern  . None   Social History Narrative    Outpatient Encounter Prescriptions as of 09/19/2015  Medication Sig  . Calcium Carbonate-Vitamin D (CALCIUM 600+D) 600-400 MG-UNIT per tablet Take 1 tablet by mouth 2 (two) times daily.  . cholecalciferol (VITAMIN D) 400 UNITS TABS tablet Take 400 Units by mouth 3 (three) times a week.  . fluticasone  (FLONASE) 50 MCG/ACT nasal spray SPRAY 2 SPRAYS INTO EACH NOSTRIL ONCE DAILY  . Mesalamine (ASACOL) 400 MG CPDR DR capsule Take 800 mg by mouth 2 (two) times daily.  . pantoprazole (PROTONIX) 40 MG tablet Take 1 tablet (40 mg total) by mouth daily.  Marland Kitchen zolpidem (AMBIEN) 5 MG tablet 1/2 - 1 tablet q hs prn  . [DISCONTINUED] buPROPion (WELLBUTRIN XL) 150 MG 24 hr tablet Take 1 tablet (150 mg total) by mouth daily.  . [DISCONTINUED] zolpidem (AMBIEN) 5 MG tablet 1/2 - 1 tablet q hs prn  . buPROPion (WELLBUTRIN XL) 300 MG 24 hr tablet Take 1 tablet (300 mg total) by mouth daily.   No facility-administered encounter medications on file as of 09/19/2015.    Review of Systems  Constitutional: Negative for appetite change and unexpected weight change.  HENT: Negative for congestion and sinus pressure.   Eyes: Negative for pain and visual disturbance.  Respiratory: Negative for cough, chest tightness and shortness of breath.   Cardiovascular: Negative for chest pain, palpitations and leg swelling.  Gastrointestinal: Negative for nausea and vomiting.       No increased pain.  Feels bowels are stable.    Genitourinary: Negative for  dysuria and difficulty urinating.  Musculoskeletal: Negative for back pain and joint swelling.  Skin: Negative for color change and rash.  Neurological: Negative for dizziness, light-headedness and headaches.  Hematological: Negative for adenopathy. Does not bruise/bleed easily.  Psychiatric/Behavioral: Negative for dysphoric mood and agitation.       Increased stress as  Outlined.        Objective:    Physical Exam  Constitutional: She is oriented to person, place, and time. She appears well-developed and well-nourished. No distress.  HENT:  Nose: Nose normal.  Mouth/Throat: Oropharynx is clear and moist.  Eyes: Right eye exhibits no discharge. Left eye exhibits no discharge. No scleral icterus.  Neck: Neck supple. No thyromegaly present.  Cardiovascular: Normal  rate and regular rhythm.   Pulmonary/Chest: Breath sounds normal. No accessory muscle usage. No tachypnea. No respiratory distress. She has no decreased breath sounds. She has no wheezes. She has no rhonchi. Right breast exhibits no inverted nipple, no mass, no nipple discharge and no tenderness (no axillary adenopathy). Left breast exhibits no inverted nipple, no mass, no nipple discharge and no tenderness (no axilarry adenopathy).  Abdominal: Soft. Bowel sounds are normal. There is no tenderness.  Musculoskeletal: She exhibits no edema or tenderness.  Lymphadenopathy:    She has no cervical adenopathy.  Neurological: She is alert and oriented to person, place, and time.  Skin: Skin is warm. No rash noted. No erythema.  Psychiatric: She has a normal mood and affect. Her behavior is normal.    BP 110/60 mmHg  Pulse 102  Temp(Src) 98 F (36.7 C) (Oral)  Resp 18  Ht 5' 5"  (1.651 m)  Wt 151 lb 8 oz (68.72 kg)  BMI 25.21 kg/m2  SpO2 98%  LMP 04/12/2010 Wt Readings from Last 3 Encounters:  09/19/15 151 lb 8 oz (68.72 kg)  06/15/15 150 lb 8 oz (68.266 kg)  04/06/15 146 lb 4 oz (66.339 kg)     Lab Results  Component Value Date   WBC 6.9 10/16/2014   HGB 15.0 10/16/2014   HCT 45.6 10/16/2014   PLT 256.0 10/16/2014   GLUCOSE 92 08/24/2015   CHOL 202* 08/24/2015   TRIG 88 08/24/2015   HDL 61 08/24/2015   LDLCALC 123* 08/24/2015   ALT 14 08/24/2015   AST 20 08/24/2015   NA 138 08/24/2015   K 4.6 08/24/2015   CL 96 08/24/2015   CREATININE 0.75 08/24/2015   BUN 14 08/24/2015   CO2 25 08/24/2015   TSH 0.62 05/12/2014    Mm Digital Screening Bilateral  01/01/2015  CLINICAL DATA:  Screening. EXAM: DIGITAL SCREENING BILATERAL MAMMOGRAM WITH CAD COMPARISON:  Previous exam(s). ACR Breast Density Category c: The breast tissue is heterogeneously dense, which may obscure small masses. FINDINGS: There are no findings suspicious for malignancy. Images were processed with CAD. IMPRESSION:  No mammographic evidence of malignancy. A result letter of this screening mammogram will be mailed directly to the patient. RECOMMENDATION: Screening mammogram in one year. (Code:SM-B-01Y) BI-RADS CATEGORY  1: Negative. Electronically Signed   By: Altamese Cabal M.D.   On: 01/01/2015 09:49       Assessment & Plan:   Problem List Items Addressed This Visit    Anxiety    Discussed with her today.  Feels wellbutrin is helping.  Feels needs to increase the dose.  Will increase to 312m q day.  Follow.  ambien to help her sleep.        Relevant Medications   buPROPion (WELLBUTRIN XL)  300 MG 24 hr tablet   Crohn's disease ([place])    Colonoscopy recently - per pt.  Need results.  Currently stable.  Followed by GI.       GERD (gastroesophageal reflux disease)    On protonix.  Upper symptoms controlled.        Health care maintenance    Physical today 09/19/15.  PAP 05/22/14 - negative with negative HPV.  Colonoscopy - states had within last year.  Need results.  Schedule mammogram when due.  Last mammogram 01/01/15 - [facility] I.       Leukocytosis    Needs f/u cbc.       Osteopenia    Weight bearing exercise,calcium and vitamin d.        Sleeping difficulty    Doing well on ambien.  Still feels needs.  Follow.         Other Visit Diagnoses    Routine general medical examination at a health care facility    -  Primary    Screening breast examination        Relevant Orders    MM DIGITAL SCREENING BILATERAL        [name], MD

## 2015-09-19 NOTE — Progress Notes (Signed)
Pre-visit discussion using our clinic review tool. No additional management support is needed unless otherwise documented below in the visit note.  

## 2015-09-20 ENCOUNTER — Encounter: Payer: Self-pay | Admitting: Internal Medicine

## 2015-09-20 NOTE — Assessment & Plan Note (Signed)
Weight bearing exercise,calcium and vitamin d.

## 2015-09-20 NOTE — Assessment & Plan Note (Signed)
Needs f/u cbc.

## 2015-09-20 NOTE — Assessment & Plan Note (Signed)
Discussed with her today.  Feels wellbutrin is helping.  Feels needs to increase the dose.  Will increase to 390m q day.  Follow.  ambien to help her sleep.

## 2015-09-20 NOTE — Assessment & Plan Note (Signed)
Doing well on ambien.  Still feels needs.  Follow.

## 2015-09-20 NOTE — Assessment & Plan Note (Signed)
Colonoscopy recently - per pt.  Need results.  Currently stable.  Followed by GI.

## 2015-09-20 NOTE — Assessment & Plan Note (Signed)
On protonix.  Upper symptoms controlled.

## 2015-10-10 ENCOUNTER — Other Ambulatory Visit: Payer: Self-pay | Admitting: Internal Medicine

## 2015-10-15 ENCOUNTER — Encounter: Payer: Self-pay | Admitting: Internal Medicine

## 2015-10-16 MED ORDER — BUPROPION HCL ER (XL) 150 MG PO TB24
150.0000 mg | ORAL_TABLET | Freq: Every day | ORAL | 1 refills | Status: DC
Start: 2015-10-16 — End: 2015-12-05

## 2015-10-16 NOTE — Telephone Encounter (Signed)
rx sent in for wellbutrin 143m as requested per pt my chart.  (#30 with one refill).

## 2015-11-06 ENCOUNTER — Telehealth: Payer: Self-pay | Admitting: *Deleted

## 2015-11-06 NOTE — Telephone Encounter (Signed)
FYI, hold until received thanks

## 2015-11-06 NOTE — Telephone Encounter (Signed)
Yes she states she will call if something changes, thanks

## 2015-11-06 NOTE — Telephone Encounter (Signed)
If she is still having swelling, she needs to be reevaluated.

## 2015-11-06 NOTE — Telephone Encounter (Signed)
Pt was seen In [facility] on 08/28 in reference to swelling in her left leg. She was advised to have a ultrasound, however, she refused due to cost. She stated that the [facility] will fax over her report.

## 2015-11-06 NOTE — Telephone Encounter (Signed)
Ok.  So I guess this means she declines f/u.  To let us know if problems.  Thanks

## 2015-11-06 NOTE — Telephone Encounter (Signed)
Spoke with the patient.   Cellulitis diagnosis at the urgent care, given keflex, 5 days of prednisone and keep foot elevated.  Swelling has decreased.    To have the US done would have been a out of pocket of over 500dollars and she was not willing to do that.   She will call if symptoms get worse or change. thanks

## 2015-11-07 NOTE — Telephone Encounter (Signed)
Patient, stated that she received results from the urgen care, she had a CRP over 100 and was advised to follow up with her PCP. Her results were faxed to this office on 08/29. Please give a time and date to schedule pt to be seen .

## 2015-11-07 NOTE — Telephone Encounter (Signed)
PAtient is not able to come tomorrow she is trying to get back to work for one day this week.  She is scheduled off on Friday, Can she be a 430?

## 2015-11-07 NOTE — Telephone Encounter (Signed)
Please advise on a appt date and time with regard to new information, thanks

## 2015-11-07 NOTE — Telephone Encounter (Signed)
Confirm pt doing ok.  I can see her tomorrow at 12:30.  There is no one scheduled at that time.

## 2015-11-08 NOTE — Telephone Encounter (Signed)
Spoke with patient, unable to come today at work, scheduled for 1pm tomorrow

## 2015-11-08 NOTE — Telephone Encounter (Signed)
Due to our meeting I don't have the lunch appts available, when would you want to see her?

## 2015-11-08 NOTE — Telephone Encounter (Signed)
She will either need to come today or what time is the meeting over?  Will it be over by 1:00.  If so, can she come in at 1:00 today.

## 2015-11-08 NOTE — Telephone Encounter (Signed)
She needs to be seen earlier than 4:30 on Friday given will need f/u labs and may need ultrasound, etc.  May need more w/up.

## 2015-11-09 ENCOUNTER — Ambulatory Visit (INDEPENDENT_AMBULATORY_CARE_PROVIDER_SITE_OTHER): Payer: BLUE CROSS/BLUE SHIELD | Admitting: Internal Medicine

## 2015-11-09 ENCOUNTER — Encounter: Payer: Self-pay | Admitting: Internal Medicine

## 2015-11-09 VITALS — BP 134/80 | HR 89 | Temp 97.9°F | Wt 149.8 lb

## 2015-11-09 DIAGNOSIS — L03119 Cellulitis of unspecified part of limb: Secondary | ICD-10-CM

## 2015-11-09 DIAGNOSIS — D72829 Elevated white blood cell count, unspecified: Secondary | ICD-10-CM | POA: Diagnosis not present

## 2015-11-09 DIAGNOSIS — R7982 Elevated C-reactive protein (CRP): Secondary | ICD-10-CM

## 2015-11-09 DIAGNOSIS — K50919 Crohn's disease, unspecified, with unspecified complications: Secondary | ICD-10-CM

## 2015-11-09 DIAGNOSIS — F419 Anxiety disorder, unspecified: Secondary | ICD-10-CM

## 2015-11-09 LAB — BASIC METABOLIC PANEL
BUN: 15 mg/dL (ref 6–23)
CO2: 31 mEq/L (ref 19–32)
Calcium: 9.7 mg/dL (ref 8.4–10.5)
Chloride: 101 mEq/L (ref 96–112)
Creatinine, Ser: 0.81 mg/dL (ref 0.40–1.20)
GFR: 76.82 mL/min (ref >60.00)
Glucose, Bld: 102 mg/dL — ABNORMAL HIGH (ref 70–99)
Potassium: 3.9 mEq/L (ref 3.5–5.1)
Sodium: 139 mEq/L (ref 135–145)

## 2015-11-09 LAB — HEPATIC FUNCTION PANEL
ALT: 16 U/L (ref 0–35)
AST: 14 U/L (ref 0–37)
Albumin: 3.9 g/dL (ref 3.5–5.2)
Alkaline Phosphatase: 79 U/L (ref 39–117)
Bilirubin, Direct: 0 mg/dL (ref 0.0–0.3)
Total Bilirubin: 0.3 mg/dL (ref 0.2–1.2)
Total Protein: 7.3 g/dL (ref 6.0–8.3)

## 2015-11-09 LAB — CK: Total CK: 27 U/L (ref 7–177)

## 2015-11-09 LAB — C-REACTIVE PROTEIN: CRP: 3.7 mg/dL (ref 0.5–20.0)

## 2015-11-09 LAB — SEDIMENTATION RATE: Sed Rate: 21 mm/hr (ref 0–30)

## 2015-11-09 NOTE — Progress Notes (Signed)
Patient ID: Lauren Chang, female   DOB: 12-16-1955, 60 y.o.   MRN: [id number]   Subjective:    Patient ID: Lauren Chang, female    DOB: 16-Feb-1956, 60 y.o.   MRN: [id number]  HPI  Patient here as a work in for ER follow up.  Was seen 11/05/15 with left leg swelling, redness and chills.  Was experiencing weakness and her knees felt stiff.  Noticed increased erythema and heat.  CRP 100.  White count 14,000.  Placed on Keflex and prednisone.  diagnosed with cellulitis.  No nausea or vomiting.  Feels better.  Still with increased stress.  Eating.  Still having GI issues.  Some abdominal pain and cramping.  Bowels are moving normally.  No blood in her stool.     Past Medical History:  Diagnosis Date  . Chlamydia   . Crohn's disease ([place])    large intestine  . GERD (gastroesophageal reflux disease)    Past Surgical History:  Procedure Laterality Date  . laparoscopy and tuba repair    . orif right calcaneus    . TONSILECTOMY/ADENOIDECTOMY WITH MYRINGOTOMY     Family History  Problem Relation Age of Onset  . Lymphoma Father   . Asthma Mother   . Osteoporosis Mother   . Mental illness Brother     suicide  . Pancreatic cancer      uncle  . COPD Maternal Grandmother   . Breast cancer Neg Hx   . Colon cancer Neg Hx    Social History   Social History  . Marital status: Married    Spouse name: N/A  . Number of children: 0  . Years of education: N/A   Social History Main Topics  . Smoking status: Former Smoker    Quit date: 03/11/1999  . Smokeless tobacco: Never Used  . Alcohol use No  . Drug use: No  . Sexual activity: Not Asked   Other Topics Concern  . None   Social History Narrative  . None    Outpatient Encounter Prescriptions as of 11/09/2015  Medication Sig  . buPROPion (WELLBUTRIN XL) 150 MG 24 hr tablet Take 1 tablet (150 mg total) by mouth daily.  . Calcium Carbonate-Vitamin D (CALCIUM 600+D) 600-400 MG-UNIT per tablet Take 1 tablet by mouth 2 (two) times  daily.  . cholecalciferol (VITAMIN D) 400 UNITS TABS tablet Take 400 Units by mouth 3 (three) times a week.  . fluticasone (FLONASE) 50 MCG/ACT nasal spray SPRAY 2 SPRAYS INTO EACH NOSTRIL ONCE DAILY  . Mesalamine (ASACOL) 400 MG CPDR DR capsule Take 800 mg by mouth 2 (two) times daily.  . pantoprazole (PROTONIX) 40 MG tablet Take 1 tablet (40 mg total) by mouth daily.  Marland Kitchen zolpidem (AMBIEN) 5 MG tablet 1/2 - 1 tablet q hs prn   No facility-administered encounter medications on file as of 11/09/2015.     Review of Systems  Constitutional: Positive for chills and fatigue. Negative for appetite change.  HENT: Negative for congestion and sinus pressure.   Respiratory: Negative for cough and shortness of breath.   Cardiovascular: Positive for leg swelling.       Leg swelling improved.    Gastrointestinal: Positive for abdominal pain. Negative for nausea and vomiting.  Genitourinary: Negative for difficulty urinating and dysuria.  Musculoskeletal:       Increased stiffness in her knees.  Increased leg weakness.    Skin:       Previously with increased erythema and warmth.  Redness  improved/resolved.    Psychiatric/Behavioral: Negative for agitation and dysphoric mood.       Increased stress.         Objective:    Physical Exam  Constitutional: She appears well-developed and well-nourished. No distress.  Neck: Neck supple.  Cardiovascular: Normal rate and regular rhythm.   Pulmonary/Chest: Breath sounds normal. No respiratory distress. She has no wheezes.  Abdominal: Soft. Bowel sounds are normal. There is no tenderness.  Musculoskeletal: She exhibits no edema or tenderness.  Lymphadenopathy:    She has no cervical adenopathy.  Skin: No rash noted. No erythema.  Psychiatric: She has a normal mood and affect. Her behavior is normal.    BP 134/80   Pulse 89   Temp 97.9 F (36.6 C) (Oral)   Wt 149 lb 12.8 oz (67.9 kg)   LMP 04/12/2010   SpO2 98%   BMI 24.93 kg/m  Wt Readings  from Last 3 Encounters:  11/09/15 149 lb 12.8 oz (67.9 kg)  09/19/15 151 lb 8 oz (68.7 kg)  06/15/15 150 lb 8 oz (68.3 kg)     Lab Results  Component Value Date   WBC 6.9 10/16/2014   HGB 15.0 10/16/2014   HCT 45.6 10/16/2014   PLT 256.0 10/16/2014   GLUCOSE 102 (H) 11/09/2015   CHOL 202 (H) 08/24/2015   TRIG 88 08/24/2015   HDL 61 08/24/2015   LDLCALC 123 (H) 08/24/2015   ALT 16 11/09/2015   AST 14 11/09/2015   NA 139 11/09/2015   K 3.9 11/09/2015   CL 101 11/09/2015   CREATININE 0.81 11/09/2015   BUN 15 11/09/2015   CO2 31 11/09/2015   TSH 0.62 05/12/2014    Mm Digital Screening Bilateral  Result Date: 01/01/2015 CLINICAL DATA:  Screening. EXAM: DIGITAL SCREENING BILATERAL MAMMOGRAM WITH CAD COMPARISON:  Previous exam(s). ACR Breast Density Category c: The breast tissue is heterogeneously dense, which may obscure small masses. FINDINGS: There are no findings suspicious for malignancy. Images were processed with CAD. IMPRESSION: No mammographic evidence of malignancy. A result letter of this screening mammogram will be mailed directly to the patient. RECOMMENDATION: Screening mammogram in one year. (Code:SM-B-01Y) BI-RADS CATEGORY  1: Negative. Electronically Signed   By: Altamese Cabal M.D.   On: 01/01/2015 09:49       Assessment & Plan:   Problem List Items Addressed This Visit    Anxiety    Discussed with her today.  On wellbutrin.  Follow.        Crohn's disease ([place]) - Primary    Is followed by GI.  With increased abdominal pain and cramping.  Persistent.  Bowels moving.  Will refer back to GI for further evaluation.        Relevant Orders   Basic metabolic panel (Completed)   Elevated C-reactive protein (CRP)    Recently diagnosed with cellulitis.  Treated with prednisone and keflex.  Doing better.  Recheck esr and crp today.  Follow.        Relevant Orders   Sedimentation rate (Completed)   C-reactive protein (Completed)   Hepatic function panel  (Completed)   CK (Creatine Kinase) (Completed)   Leukocytosis    Will need f/u cbc.  Hold on checking today.  She is on prednisone.  Follow.         Other Visit Diagnoses    Cellulitis of lower extremity, unspecified laterality       treated with prednisone and keflex.  no increased erythema and warmth now.  feels better.  recheck esr, crp and ck.  follow.        [name], MD

## 2015-11-09 NOTE — Progress Notes (Signed)
Pre visit review using our clinic review tool, if applicable. No additional management support is needed unless otherwise documented below in the visit note. 

## 2015-11-10 ENCOUNTER — Encounter: Payer: Self-pay | Admitting: Internal Medicine

## 2015-11-10 NOTE — Assessment & Plan Note (Signed)
Discussed with her today.  On wellbutrin.  Follow.

## 2015-11-10 NOTE — Assessment & Plan Note (Signed)
Recently diagnosed with cellulitis.  Treated with prednisone and keflex.  Doing better.  Recheck esr and crp today.  Follow.

## 2015-11-10 NOTE — Assessment & Plan Note (Signed)
Will need f/u cbc.  Hold on checking today.  She is on prednisone.  Follow.

## 2015-11-10 NOTE — Assessment & Plan Note (Signed)
Is followed by GI.  With increased abdominal pain and cramping.  Persistent.  Bowels moving.  Will refer back to GI for further evaluation.

## 2015-11-19 ENCOUNTER — Encounter: Payer: Self-pay | Admitting: Internal Medicine

## 2015-11-19 ENCOUNTER — Telehealth: Payer: Self-pay | Admitting: *Deleted

## 2015-11-19 NOTE — Telephone Encounter (Signed)
Please advise, picture of leg was in the [facility] message that was forwarded earlier to you.

## 2015-11-19 NOTE — Telephone Encounter (Signed)
Please advise, thanks.

## 2015-11-19 NOTE — Telephone Encounter (Signed)
Pt wanted to FYI  Dr. Nicki Reaper that she has swelling in her left ankle, the swelling did not go down with elevation . The ankle has swelling with warmth to the tough. Pt sent a mychart message over along with an attached photo.  Pt contact [phone]

## 2015-11-19 NOTE — Telephone Encounter (Signed)
I can see her tomorrow at 12:00 - work in for this.  Please add to schedule.

## 2015-11-19 NOTE — Telephone Encounter (Signed)
Scheduled for tomorrow at 12, per another message, thanks

## 2015-11-20 ENCOUNTER — Ambulatory Visit (INDEPENDENT_AMBULATORY_CARE_PROVIDER_SITE_OTHER): Payer: BLUE CROSS/BLUE SHIELD | Admitting: Internal Medicine

## 2015-11-20 ENCOUNTER — Encounter: Payer: Self-pay | Admitting: Internal Medicine

## 2015-11-20 DIAGNOSIS — F419 Anxiety disorder, unspecified: Secondary | ICD-10-CM

## 2015-11-20 DIAGNOSIS — K50919 Crohn's disease, unspecified, with unspecified complications: Secondary | ICD-10-CM

## 2015-11-20 DIAGNOSIS — M2540 Effusion, unspecified joint: Secondary | ICD-10-CM | POA: Diagnosis not present

## 2015-11-20 DIAGNOSIS — M25572 Pain in left ankle and joints of left foot: Secondary | ICD-10-CM

## 2015-11-20 DIAGNOSIS — D72829 Elevated white blood cell count, unspecified: Secondary | ICD-10-CM | POA: Diagnosis not present

## 2015-11-20 DIAGNOSIS — M25579 Pain in unspecified ankle and joints of unspecified foot: Secondary | ICD-10-CM | POA: Insufficient documentation

## 2015-11-20 LAB — CBC WITH DIFFERENTIAL/PLATELET
Basophils Absolute: 0.1 10*3/uL (ref 0.0–0.1)
Basophils Relative: 0.7 % (ref 0.0–3.0)
Eosinophils Absolute: 0.3 10*3/uL (ref 0.0–0.7)
Eosinophils Relative: 2.2 % (ref 0.0–5.0)
HCT: 35.9 % — ABNORMAL LOW (ref 36.0–46.0)
Hemoglobin: 11.9 g/dL — ABNORMAL LOW (ref 12.0–15.0)
Lymphocytes Relative: 10.8 % — ABNORMAL LOW (ref 12.0–46.0)
Lymphs Abs: 1.6 10*3/uL (ref 0.7–4.0)
MCHC: 33.1 g/dL (ref 30.0–36.0)
MCV: 80.8 fl (ref 78.0–100.0)
Monocytes Absolute: 1.6 10*3/uL — ABNORMAL HIGH (ref 0.1–1.0)
Monocytes Relative: 11 % (ref 3.0–12.0)
Neutro Abs: 10.8 10*3/uL — ABNORMAL HIGH (ref 1.4–7.7)
Neutrophils Relative %: 75.3 % (ref 43.0–77.0)
Platelets: 475 10*3/uL — ABNORMAL HIGH (ref 150.0–400.0)
RBC: 4.44 Mil/uL (ref 3.87–5.11)
RDW: 14.2 % (ref 11.5–15.5)
WBC: 14.4 10*3/uL — ABNORMAL HIGH (ref 4.0–10.5)

## 2015-11-20 LAB — C-REACTIVE PROTEIN: CRP: 14.2 mg/dL (ref 0.5–20.0)

## 2015-11-20 LAB — SEDIMENTATION RATE: Sed Rate: 17 mm/hr (ref 0–30)

## 2015-11-20 MED ORDER — PREDNISONE 5 MG PO TABS: ORAL_TABLET | ORAL | 0 refills | Status: DC

## 2015-11-20 NOTE — Progress Notes (Signed)
Patient ID: Lauren Chang, female   DOB: 1955/12/01, 60 y.o.   MRN: [id number]   Subjective:    Patient ID: Lauren Chang, female    DOB: Aug 12, 1955, 60 y.o.   MRN: [id number]  HPI  Patient here as a work in with concerns regarding ankle pain and swelling.  Was here on 11/09/15 for ER follow up.  Was seen 11/05/15 with left leg swelling, redness and chills.  Was placed on keflex and prednisone.  Leg much better at her 11/09/15 visit.  Reports that she noticed some increased swelling and redness last week.  Temp 100.4.  100 this am.  Eating and drinking well.  Some discomfort in her right wrist.  Abdomen is some better.  Started clear liquids.  Taking a probiotic.  Some diarrhea.   No blood.     Past Medical History:  Diagnosis Date  . Chlamydia   . Crohn's disease ([place])    large intestine  . GERD (gastroesophageal reflux disease)    Past Surgical History:  Procedure Laterality Date  . laparoscopy and tuba repair    . orif right calcaneus    . TONSILECTOMY/ADENOIDECTOMY WITH MYRINGOTOMY     Family History  Problem Relation Age of Onset  . Lymphoma Father   . Asthma Mother   . Osteoporosis Mother   . Mental illness Brother     suicide  . Pancreatic cancer      uncle  . COPD Maternal Grandmother   . Breast cancer Neg Hx   . Colon cancer Neg Hx    Social History   Social History  . Marital status: Married    Spouse name: N/A  . Number of children: 0  . Years of education: N/A   Social History Main Topics  . Smoking status: Former Smoker    Quit date: 03/11/1999  . Smokeless tobacco: Never Used  . Alcohol use No  . Drug use: No  . Sexual activity: Not Asked   Other Topics Concern  . None   Social History Narrative  . None    Outpatient Encounter Prescriptions as of 11/20/2015  Medication Sig  . buPROPion (WELLBUTRIN XL) 150 MG 24 hr tablet Take 1 tablet (150 mg total) by mouth daily.  . Calcium Carbonate-Vitamin D (CALCIUM 600+D) 600-400 MG-UNIT per tablet Take  1 tablet by mouth 2 (two) times daily.  . cholecalciferol (VITAMIN D) 400 UNITS TABS tablet Take 400 Units by mouth 3 (three) times a week.  . fluticasone (FLONASE) 50 MCG/ACT nasal spray SPRAY 2 SPRAYS INTO EACH NOSTRIL ONCE DAILY  . Mesalamine (ASACOL) 400 MG CPDR DR capsule Take 800 mg by mouth 2 (two) times daily.  . pantoprazole (PROTONIX) 40 MG tablet Take 1 tablet (40 mg total) by mouth daily.  Marland Kitchen zolpidem (AMBIEN) 5 MG tablet 1/2 - 1 tablet q hs prn  . predniSONE (DELTASONE) 5 MG tablet Take 6 pills (34m) x 1 day and then decreased by one tablet per day until down to zero mg.   No facility-administered encounter medications on file as of 11/20/2015.     Review of Systems  Constitutional: Positive for fever.       Clear liquids.   HENT: Negative for congestion and sinus pressure.   Respiratory: Negative for cough and shortness of breath.   Cardiovascular: Negative for chest pain and palpitations.  Gastrointestinal: Positive for diarrhea. Negative for vomiting.  Genitourinary: Negative for difficulty urinating and dysuria.  Musculoskeletal:  Right wrist discomfort as outlined.  Left ankle swelling and increased erythema.  Left ankle pain.    Skin: Negative for color change and rash.  Neurological: Negative for dizziness and headaches.       Objective:    Physical Exam  Constitutional: She appears well-developed and well-nourished. No distress.  HENT:  Nose: Nose normal.  Mouth/Throat: Oropharynx is clear and moist.  Neck: Neck supple.  Cardiovascular: Normal rate and regular rhythm.   Pulmonary/Chest: Breath sounds normal. No respiratory distress. She has no wheezes.  Abdominal: Soft. Bowel sounds are normal. There is no tenderness.  Musculoskeletal: She exhibits no tenderness.  Increased soft tissue swelling and erythema - left ankle.  Increased pain to palpation.    Lymphadenopathy:    She has no cervical adenopathy.  Skin: There is erythema.  Psychiatric: She  has a normal mood and affect. Her behavior is normal.    BP (!) 100/58   Pulse (!) 102   Temp 98.5 F (36.9 C) (Oral)   Wt 146 lb 12.8 oz (66.6 kg)   LMP 04/12/2010   SpO2 97%   BMI 24.43 kg/m  Wt Readings from Last 3 Encounters:  11/20/15 146 lb 12.8 oz (66.6 kg)  11/09/15 149 lb 12.8 oz (67.9 kg)  09/19/15 151 lb 8 oz (68.7 kg)     Lab Results  Component Value Date   WBC 14.4 (H) 11/20/2015   HGB 11.9 (L) 11/20/2015   HCT 35.9 (L) 11/20/2015   PLT 475.0 (H) 11/20/2015   GLUCOSE 102 (H) 11/09/2015   CHOL 202 (H) 08/24/2015   TRIG 88 08/24/2015   HDL 61 08/24/2015   LDLCALC 123 (H) 08/24/2015   ALT 16 11/09/2015   AST 14 11/09/2015   NA 139 11/09/2015   K 3.9 11/09/2015   CL 101 11/09/2015   CREATININE 0.81 11/09/2015   BUN 15 11/09/2015   CO2 31 11/09/2015   TSH 0.62 05/12/2014    Mm Digital Screening Bilateral  Result Date: 01/01/2015 CLINICAL DATA:  Screening. EXAM: DIGITAL SCREENING BILATERAL MAMMOGRAM WITH CAD COMPARISON:  Previous exam(s). ACR Breast Density Category c: The breast tissue is heterogeneously dense, which may obscure small masses. FINDINGS: There are no findings suspicious for malignancy. Images were processed with CAD. IMPRESSION: No mammographic evidence of malignancy. A result letter of this screening mammogram will be mailed directly to the patient. RECOMMENDATION: Screening mammogram in one year. (Code:SM-B-01Y) BI-RADS CATEGORY  1: Negative. Electronically Signed   By: Altamese Cabal M.D.   On: 01/01/2015 09:49       Assessment & Plan:   Problem List Items Addressed This Visit    Anxiety    On wellbutrin.  Stable.       Crohn's disease ([place])    Bowels as outlined.  Persistent symptoms.  F/u with GI.        Joint swelling    Prednisone taper as outlined.  Refer to rheumatology.  Follow closely.  Hold abx.        Relevant Orders   CBC with Differential/Platelet (Completed)   Rheumatoid factor (Completed)   ANA (Completed)    C-reactive protein (Completed)   Sedimentation rate (Completed)   Ambulatory referral to Rheumatology   Leukocytosis    Recheck cbc today.       Pain in joint, ankle and foot    Pain, swelling and redness as outlined.  Reoccurrence.  Has some wrist discomfort.  Prednisone taper as outlined.  Hold on abx.  Check esr  and crp.  Refer to rheumatology.        Relevant Orders   CBC with Differential/Platelet (Completed)   Rheumatoid factor (Completed)   ANA (Completed)   C-reactive protein (Completed)   Sedimentation rate (Completed)   Ambulatory referral to Rheumatology    Other Visit Diagnoses   None.      [name], MD

## 2015-11-20 NOTE — Progress Notes (Signed)
Pre visit review using our clinic review tool, if applicable. No additional management support is needed unless otherwise documented below in the visit note. 

## 2015-11-21 ENCOUNTER — Encounter: Payer: Self-pay | Admitting: Internal Medicine

## 2015-11-21 LAB — RHEUMATOID FACTOR: Rhuematoid fact SerPl-aCnc: <14 IU/mL (ref <14)

## 2015-11-21 LAB — ANA: Anti Nuclear Antibody(ANA): NEGATIVE

## 2015-11-22 ENCOUNTER — Encounter: Payer: Self-pay | Admitting: Internal Medicine

## 2015-11-25 ENCOUNTER — Encounter: Payer: Self-pay | Admitting: Internal Medicine

## 2015-11-25 NOTE — Assessment & Plan Note (Signed)
Recheck cbc today.

## 2015-11-25 NOTE — Assessment & Plan Note (Signed)
Bowels as outlined.  Persistent symptoms.  F/u with GI.

## 2015-11-25 NOTE — Assessment & Plan Note (Signed)
On wellbutrin.  Stable.

## 2015-11-25 NOTE — Assessment & Plan Note (Signed)
Prednisone taper as outlined.  Refer to rheumatology.  Follow closely.  Hold abx.

## 2015-11-25 NOTE — Assessment & Plan Note (Signed)
Pain, swelling and redness as outlined.  Reoccurrence.  Has some wrist discomfort.  Prednisone taper as outlined.  Hold on abx.  Check esr and crp.  Refer to rheumatology.

## 2015-11-26 DIAGNOSIS — M1380 Other specified arthritis, unspecified site: Secondary | ICD-10-CM | POA: Insufficient documentation

## 2015-11-26 DIAGNOSIS — M25572 Pain in left ankle and joints of left foot: Secondary | ICD-10-CM | POA: Insufficient documentation

## 2015-12-05 ENCOUNTER — Other Ambulatory Visit: Payer: Self-pay | Admitting: Internal Medicine

## 2015-12-10 ENCOUNTER — Other Ambulatory Visit: Payer: Self-pay | Admitting: Gastroenterology

## 2015-12-10 DIAGNOSIS — K5010 Crohn's disease of large intestine without complications: Secondary | ICD-10-CM

## 2015-12-14 ENCOUNTER — Ambulatory Visit (INDEPENDENT_AMBULATORY_CARE_PROVIDER_SITE_OTHER): Payer: BLUE CROSS/BLUE SHIELD | Admitting: Internal Medicine

## 2015-12-14 ENCOUNTER — Encounter: Payer: Self-pay | Admitting: Internal Medicine

## 2015-12-14 ENCOUNTER — Ambulatory Visit
Admission: RE | Admit: 2015-12-14 | Discharge: 2015-12-14 | Disposition: A | Discharge status: Home / Self Care | Payer: BLUE CROSS/BLUE SHIELD | Source: Ambulatory Visit | Attending: Gastroenterology | Admitting: Gastroenterology

## 2015-12-14 ENCOUNTER — Ambulatory Visit: Payer: BLUE CROSS/BLUE SHIELD

## 2015-12-14 DIAGNOSIS — N200 Calculus of kidney: Secondary | ICD-10-CM | POA: Insufficient documentation

## 2015-12-14 DIAGNOSIS — F419 Anxiety disorder, unspecified: Secondary | ICD-10-CM

## 2015-12-14 DIAGNOSIS — R195 Other fecal abnormalities: Secondary | ICD-10-CM | POA: Diagnosis not present

## 2015-12-14 DIAGNOSIS — K219 Gastro-esophageal reflux disease without esophagitis: Secondary | ICD-10-CM

## 2015-12-14 DIAGNOSIS — D72829 Elevated white blood cell count, unspecified: Secondary | ICD-10-CM

## 2015-12-14 DIAGNOSIS — K5150 Left sided colitis without complications: Secondary | ICD-10-CM | POA: Diagnosis not present

## 2015-12-14 DIAGNOSIS — K5010 Crohn's disease of large intestine without complications: Secondary | ICD-10-CM

## 2015-12-14 DIAGNOSIS — M25572 Pain in left ankle and joints of left foot: Secondary | ICD-10-CM

## 2015-12-14 DIAGNOSIS — K50919 Crohn's disease, unspecified, with unspecified complications: Secondary | ICD-10-CM | POA: Diagnosis not present

## 2015-12-14 DIAGNOSIS — R932 Abnormal findings on diagnostic imaging of liver and biliary tract: Secondary | ICD-10-CM | POA: Diagnosis not present

## 2015-12-14 MED ORDER — ZOLPIDEM TARTRATE 5 MG PO TABS: ORAL_TABLET | ORAL | 0 refills | Status: DC

## 2015-12-14 MED ORDER — BUPROPION HCL ER (XL) 150 MG PO TB24: 150.0000 mg | ORAL_TABLET | Freq: Every day | ORAL | 2 refills | Status: DC

## 2015-12-14 MED ORDER — IOPAMIDOL (ISOVUE-300) INJECTION 61%
100.0000 mL | Freq: Once | INTRAVENOUS | Status: AC | PRN
  Administered 2015-12-14: 100 mL via INTRAVENOUS

## 2015-12-14 NOTE — Progress Notes (Signed)
Pre visit review using our clinic review tool, if applicable. No additional management support is needed unless otherwise documented below in the visit note. 

## 2015-12-14 NOTE — Progress Notes (Signed)
Patient ID: Lauren Chang, female   DOB: 12-Feb-1956, 60 y.o.   MRN: [id number]   Subjective:    Patient ID: Lauren Chang, female    DOB: [birth year], 60 y.o.   MRN: [id number]  HPI  Patient here for a scheduled follow up.  She is doing better.  Has been on prednisone.  Finishing soon.  Still with some discomfort in her ankle.  Is better.  No redness.  Her GI symptoms are better.  Due to f/u soon with GI.  Breathing doing well.     Past Medical History:  Diagnosis Date  . Chlamydia   . Crohn's disease ([place])    large intestine  . GERD (gastroesophageal reflux disease)    Past Surgical History:  Procedure Laterality Date  . laparoscopy and tuba repair    . orif right calcaneus    . TONSILECTOMY/ADENOIDECTOMY WITH MYRINGOTOMY     Family History  Problem Relation Age of Onset  . Lymphoma Father   . Asthma Mother   . Osteoporosis Mother   . Mental illness Brother     suicide  . Pancreatic cancer      uncle  . COPD Maternal Grandmother   . Breast cancer Neg Hx   . Colon cancer Neg Hx    Social History   Social History  . Marital status: Married    Spouse name: N/A  . Number of children: 0  . Years of education: N/A   Social History Main Topics  . Smoking status: Former Smoker    Quit date: 03/11/1999  . Smokeless tobacco: Never Used  . Alcohol use No  . Drug use: No  . Sexual activity: Not Asked   Other Topics Concern  . None   Social History Narrative  . None    Outpatient Encounter Prescriptions as of 12/14/2015  Medication Sig  . buPROPion (WELLBUTRIN XL) 150 MG 24 hr tablet Take 1 tablet (150 mg total) by mouth daily.  . Calcium Carbonate-Vitamin D (CALCIUM 600+D) 600-400 MG-UNIT per tablet Take 1 tablet by mouth 2 (two) times daily.  . cholecalciferol (VITAMIN D) 400 UNITS TABS tablet Take 400 Units by mouth 3 (three) times a week.  . fluticasone (FLONASE) 50 MCG/ACT nasal spray SPRAY 2 SPRAYS INTO EACH NOSTRIL ONCE DAILY  . Mesalamine (ASACOL) 400 MG  CPDR DR capsule Take 800 mg by mouth 2 (two) times daily.  . pantoprazole (PROTONIX) 40 MG tablet Take 1 tablet (40 mg total) by mouth daily.  . predniSONE (DELTASONE) 5 MG tablet Take 6 pills (56m) x 1 day and then decreased by one tablet per day until down to zero mg.  . zolpidem (AMBIEN) 5 MG tablet 1/2 - 1 tablet q hs prn  . [DISCONTINUED] buPROPion (WELLBUTRIN XL) 150 MG 24 hr tablet TAKE 1 TABLET BY MOUTH ONCE DAILY.  . [DISCONTINUED] zolpidem (AMBIEN) 5 MG tablet 1/2 - 1 tablet q hs prn   No facility-administered encounter medications on file as of 12/14/2015.     Review of Systems  Constitutional: Negative for appetite change and unexpected weight change.  HENT: Negative for congestion and sinus pressure.   Respiratory: Negative for cough, chest tightness and shortness of breath.   Cardiovascular: Negative for chest pain and palpitations.  Gastrointestinal: Negative for abdominal pain, diarrhea, nausea and vomiting.  Musculoskeletal: Negative for back pain.       Ankle/foot better.  Swelling improved.  Minimal discomfort.   Skin: Negative for color change and rash.  Neurological:  Negative for dizziness, light-headedness and headaches.  Psychiatric/Behavioral: Negative for agitation and dysphoric mood.       Increased stress with her husband's health issues.  Handling things well.         Objective:    Physical Exam  Constitutional: She appears well-developed and well-nourished. No distress.  HENT:  Nose: Nose normal.  Mouth/Throat: Oropharynx is clear and moist.  Neck: Neck supple. No thyromegaly present.  Cardiovascular: Normal rate and regular rhythm.   Pulmonary/Chest: Breath sounds normal. No respiratory distress. She has no wheezes.  Abdominal: Soft. Bowel sounds are normal. There is no tenderness.  Musculoskeletal: She exhibits no tenderness.  Very minimal ankle swelling.  Minimal tenderness to palpation.  No pain with movement.    Lymphadenopathy:    She has no  cervical adenopathy.  Skin: No rash noted. No erythema.  Psychiatric: She has a normal mood and affect. Her behavior is normal.    BP (!) 142/86   Pulse 85   Temp 97.8 F (36.6 C) (Oral)   Ht 5' 5"  (1.651 m)   Wt 148 lb 3.2 oz (67.2 kg)   LMP 04/12/2010   SpO2 97%   BMI 24.66 kg/m  Wt Readings from Last 3 Encounters:  12/14/15 148 lb 3.2 oz (67.2 kg)  11/20/15 146 lb 12.8 oz (66.6 kg)  11/09/15 149 lb 12.8 oz (67.9 kg)     Lab Results  Component Value Date   WBC 14.4 (H) 11/20/2015   HGB 11.9 (L) 11/20/2015   HCT 35.9 (L) 11/20/2015   PLT 475.0 (H) 11/20/2015   GLUCOSE 102 (H) 11/09/2015   CHOL 202 (H) 08/24/2015   TRIG 88 08/24/2015   HDL 61 08/24/2015   LDLCALC 123 (H) 08/24/2015   ALT 16 11/09/2015   AST 14 11/09/2015   NA 139 11/09/2015   K 3.9 11/09/2015   CL 101 11/09/2015   CREATININE 0.81 11/09/2015   BUN 15 11/09/2015   CO2 31 11/09/2015   TSH 0.62 05/12/2014    Mm Digital Screening Bilateral  Result Date: 01/01/2015 CLINICAL DATA:  Screening. EXAM: DIGITAL SCREENING BILATERAL MAMMOGRAM WITH CAD COMPARISON:  Previous exam(s). ACR Breast Density Category c: The breast tissue is heterogeneously dense, which may obscure small masses. FINDINGS: There are no findings suspicious for malignancy. Images were processed with CAD. IMPRESSION: No mammographic evidence of malignancy. A result letter of this screening mammogram will be mailed directly to the patient. RECOMMENDATION: Screening mammogram in one year. (Code:SM-B-01Y) BI-RADS CATEGORY  1: Negative. Electronically Signed   By: Altamese Cabal M.D.   On: 01/01/2015 09:49       Assessment & Plan:   Problem List Items Addressed This Visit    Anxiety    On wellbutrin and doing relatively well.  Does not feel needs anything more.  Follow.       Relevant Medications   buPROPion (WELLBUTRIN XL) 150 MG 24 hr tablet   Crohn's disease ([place])    Better.  On prednisone taper.  On asacol.  Seeing GI.        GERD (gastroesophageal reflux disease)    Upper symptoms controlled on protonix.       Leukocytosis    Will need recheck cbc when off prednisone.  Doing better.  Follow.       Pain in joint, ankle and foot    Saw rheumatology.  Seronegative arthritis.  Tapering prednisone.  Follow.  Better.        Other Visit Diagnoses  None.      [name], MD

## 2015-12-15 ENCOUNTER — Encounter: Payer: Self-pay | Admitting: Internal Medicine

## 2015-12-15 NOTE — Assessment & Plan Note (Signed)
Saw rheumatology.  Seronegative arthritis.  Tapering prednisone.  Follow.  Better.

## 2015-12-15 NOTE — Assessment & Plan Note (Signed)
Will need recheck cbc when off prednisone.  Doing better.  Follow.

## 2015-12-15 NOTE — Assessment & Plan Note (Signed)
Upper symptoms controlled on protonix.

## 2015-12-15 NOTE — Assessment & Plan Note (Signed)
Better.  On prednisone taper.  On asacol.  Seeing GI.

## 2015-12-15 NOTE — Assessment & Plan Note (Signed)
On wellbutrin and doing relatively well.  Does not feel needs anything more.  Follow.

## 2016-01-02 ENCOUNTER — Ambulatory Visit: Payer: BLUE CROSS/BLUE SHIELD

## 2016-01-07 ENCOUNTER — Ambulatory Visit
Admission: RE | Admit: 2016-01-07 | Discharge: 2016-01-07 | Disposition: A | Discharge status: Home / Self Care | Payer: BLUE CROSS/BLUE SHIELD | Source: Ambulatory Visit | Attending: Internal Medicine | Admitting: Internal Medicine

## 2016-01-07 DIAGNOSIS — Z1239 Encounter for other screening for malignant neoplasm of breast: Secondary | ICD-10-CM

## 2016-01-07 DIAGNOSIS — Z1231 Encounter for screening mammogram for malignant neoplasm of breast: Secondary | ICD-10-CM | POA: Insufficient documentation

## 2016-01-07 LAB — HM MAMMOGRAPHY

## 2016-01-22 ENCOUNTER — Encounter: Payer: Self-pay | Admitting: Internal Medicine

## 2016-01-22 NOTE — Telephone Encounter (Signed)
This is the patient that I talked to you about.  Please let her know you are checking on this.  Thanks

## 2016-01-22 NOTE — Telephone Encounter (Signed)
Called patient 2:30 01/22/16 and advised will research and follow up.

## 2016-02-03 ENCOUNTER — Other Ambulatory Visit: Payer: Self-pay | Admitting: Internal Medicine

## 2016-02-29 ENCOUNTER — Other Ambulatory Visit: Payer: Self-pay | Admitting: Internal Medicine

## 2016-03-18 ENCOUNTER — Encounter: Payer: Self-pay | Admitting: Internal Medicine

## 2016-04-18 ENCOUNTER — Ambulatory Visit (INDEPENDENT_AMBULATORY_CARE_PROVIDER_SITE_OTHER): Payer: BLUE CROSS/BLUE SHIELD | Admitting: Internal Medicine

## 2016-04-18 ENCOUNTER — Encounter: Payer: Self-pay | Admitting: Internal Medicine

## 2016-04-18 DIAGNOSIS — G479 Sleep disorder, unspecified: Secondary | ICD-10-CM

## 2016-04-18 DIAGNOSIS — K219 Gastro-esophageal reflux disease without esophagitis: Secondary | ICD-10-CM | POA: Diagnosis not present

## 2016-04-18 DIAGNOSIS — D72829 Elevated white blood cell count, unspecified: Secondary | ICD-10-CM

## 2016-04-18 DIAGNOSIS — K50919 Crohn's disease, unspecified, with unspecified complications: Secondary | ICD-10-CM | POA: Diagnosis not present

## 2016-04-18 DIAGNOSIS — F419 Anxiety disorder, unspecified: Secondary | ICD-10-CM

## 2016-04-18 MED ORDER — BUPROPION HCL ER (XL) 150 MG PO TB24: 150.0000 mg | ORAL_TABLET | Freq: Every day | ORAL | 3 refills | Status: DC

## 2016-04-18 MED ORDER — ZOLPIDEM TARTRATE 5 MG PO TABS: 2.5000 mg | ORAL_TABLET | Freq: Every evening | ORAL | 0 refills | Status: DC | PRN

## 2016-04-18 NOTE — Progress Notes (Signed)
Pre-visit discussion using our clinic review tool. No additional management support is needed unless otherwise documented below in the visit note.  

## 2016-04-18 NOTE — Progress Notes (Signed)
Patient ID: Lauren Chang, female   DOB: [birth year], 61 y.o.   MRN: [id number]   Subjective:    Patient ID: Lauren Chang, female    DOB: 1955/09/26, 61 y.o.   MRN: [id number]  HPI  Patient here for a scheduled follow up.  States she is doing relatively well.  No chest pain.  No sob.  No acid reflux.  No abdominal pain.  Bowels doing better.  Sees GI.  Still with increased stress with her husband's issues.  Overall she feels she is handling things relatively well.     Past Medical History:  Diagnosis Date  . Chlamydia   . Crohn's disease ([place])    large intestine  . GERD (gastroesophageal reflux disease)    Past Surgical History:  Procedure Laterality Date  . laparoscopy and tuba repair    . orif right calcaneus    . TONSILECTOMY/ADENOIDECTOMY WITH MYRINGOTOMY     Family History  Problem Relation Age of Onset  . Lymphoma Father   . Asthma Mother   . Osteoporosis Mother   . Mental illness Brother     suicide  . Pancreatic cancer      uncle  . COPD Maternal Grandmother   . Breast cancer Neg Hx   . Colon cancer Neg Hx    Social History   Social History  . Marital status: Married    Spouse name: N/A  . Number of children: 0  . Years of education: N/A   Social History Main Topics  . Smoking status: Former Smoker    Quit date: 03/11/1999  . Smokeless tobacco: Never Used  . Alcohol use No  . Drug use: No  . Sexual activity: Not Asked   Other Topics Concern  . None   Social History Narrative  . None    Outpatient Encounter Prescriptions as of 04/18/2016  Medication Sig  . budesonide (ENTOCORT EC) 3 MG 24 hr capsule Take 9 mg by mouth daily.   Marland Kitchen buPROPion (WELLBUTRIN XL) 150 MG 24 hr tablet Take 1 tablet (150 mg total) by mouth daily.  . Calcium Carbonate-Vitamin D (CALCIUM 600+D) 600-400 MG-UNIT per tablet Take 1 tablet by mouth 2 (two) times daily.  . cholecalciferol (VITAMIN D) 400 UNITS TABS tablet Take 400 Units by mouth 3 (three) times a week.  .  fluticasone (FLONASE) 50 MCG/ACT nasal spray SPRAY 2 SPRAYS INTO EACH NOSTRIL ONCE DAILY  . Mesalamine (ASACOL) 400 MG CPDR DR capsule Take 800 mg by mouth 2 (two) times daily.  . pantoprazole (PROTONIX) 40 MG tablet Take 1 tablet (40 mg total) by mouth daily.  . predniSONE (DELTASONE) 5 MG tablet Take 6 pills (25m) x 1 day and then decreased by one tablet per day until down to zero mg.  . zolpidem (AMBIEN) 5 MG tablet Take 0.5-1 tablets (2.5-5 mg total) by mouth at bedtime as needed.  . [DISCONTINUED] buPROPion (WELLBUTRIN XL) 150 MG 24 hr tablet Take 1 tablet (150 mg total) by mouth daily.  . [DISCONTINUED] zolpidem (AMBIEN) 5 MG tablet TAKE 1/2 TO 1 TABLET BY MOUTH AT BEDTIME AS NEEDED  . [DISCONTINUED] buPROPion (WELLBUTRIN XL) 150 MG 24 hr tablet TAKE 1 TABLET BY MOUTH ONCE DAILY.   No facility-administered encounter medications on file as of 04/18/2016.     Review of Systems  Constitutional: Negative for appetite change and unexpected weight change.  HENT: Negative for congestion and sinus pressure.   Respiratory: Negative for cough, chest tightness and shortness of breath.  Cardiovascular: Negative for chest pain, palpitations and leg swelling.  Gastrointestinal: Negative for abdominal pain, diarrhea, nausea and vomiting.  Genitourinary: Negative for difficulty urinating and dysuria.  Musculoskeletal: Negative for back pain and joint swelling.  Skin: Negative for color change and rash.  Neurological: Negative for dizziness, light-headedness and headaches.  Psychiatric/Behavioral: Negative for agitation and dysphoric mood.       Objective:    Physical Exam  Constitutional: She appears well-developed and well-nourished. No distress.  HENT:  Nose: Nose normal.  Mouth/Throat: Oropharynx is clear and moist.  Neck: Neck supple. No thyromegaly present.  Cardiovascular: Normal rate and regular rhythm.   Pulmonary/Chest: Breath sounds normal. No respiratory distress. She has no  wheezes.  Abdominal: Soft. Bowel sounds are normal. There is no tenderness.  Musculoskeletal: She exhibits no edema or tenderness.  Lymphadenopathy:    She has no cervical adenopathy.  Skin: No rash noted. No erythema.  Psychiatric: She has a normal mood and affect. Her behavior is normal.    BP 120/70   Pulse 93   Temp 97.7 F (36.5 C) (Oral)   Resp 12   Ht 5' 5"  (1.651 m)   Wt 150 lb 8 oz (68.3 kg)   LMP 04/12/2010   SpO2 99%   BMI 25.04 kg/m  Wt Readings from Last 3 Encounters:  04/18/16 150 lb 8 oz (68.3 kg)  12/14/15 148 lb 3.2 oz (67.2 kg)  11/20/15 146 lb 12.8 oz (66.6 kg)     Lab Results  Component Value Date   WBC 14.4 (H) 11/20/2015   HGB 11.9 (L) 11/20/2015   HCT 35.9 (L) 11/20/2015   PLT 475.0 (H) 11/20/2015   GLUCOSE 102 (H) 11/09/2015   CHOL 202 (H) 08/24/2015   TRIG 88 08/24/2015   HDL 61 08/24/2015   LDLCALC 123 (H) 08/24/2015   ALT 16 11/09/2015   AST 14 11/09/2015   NA 139 11/09/2015   K 3.9 11/09/2015   CL 101 11/09/2015   CREATININE 0.81 11/09/2015   BUN 15 11/09/2015   CO2 31 11/09/2015   TSH 0.62 05/12/2014    Mm Digital Screening Bilateral  Result Date: 01/07/2016 CLINICAL DATA:  Screening. EXAM: DIGITAL SCREENING BILATERAL MAMMOGRAM WITH CAD COMPARISON:  Previous exam(s). ACR Breast Density Category c: The breast tissue is heterogeneously dense, which may obscure small masses. FINDINGS: There are no findings suspicious for malignancy. Images were processed with CAD. IMPRESSION: No mammographic evidence of malignancy. A result letter of this screening mammogram will be mailed directly to the patient. RECOMMENDATION: Screening mammogram in one year. (Code:SM-B-01Y) BI-RADS CATEGORY  1: Negative. Electronically Signed   By: Curlene Dolphin M.D.   On: 01/07/2016 16:52       Assessment & Plan:   Problem List Items Addressed This Visit    Anxiety    On wellbutrin and doing well.  Overall feels she is handling things relatively well.  Follow.         Relevant Medications   buPROPion (WELLBUTRIN XL) 150 MG 24 hr tablet   Crohn's disease ([place])    Followed by GI.  Doing better.  Bowels stable.  No abdominal pain.        GERD (gastroesophageal reflux disease)    Controlled on protonix.  Follow.       Leukocytosis    Recheck cbc to confirm normal.        Sleeping difficulty    Uses ambien prn.  Tolerating without adverse effects.  Follow.  [name], MD

## 2016-04-27 ENCOUNTER — Encounter: Payer: Self-pay | Admitting: Internal Medicine

## 2016-04-27 NOTE — Assessment & Plan Note (Signed)
Followed by GI.  Doing better.  Bowels stable.  No abdominal pain.

## 2016-04-27 NOTE — Assessment & Plan Note (Signed)
Uses ambien prn.  Tolerating without adverse effects.  Follow.

## 2016-04-27 NOTE — Assessment & Plan Note (Signed)
Controlled on protonix.  Follow.   

## 2016-04-27 NOTE — Assessment & Plan Note (Signed)
Recheck cbc to confirm normal.

## 2016-04-27 NOTE — Assessment & Plan Note (Signed)
On wellbutrin and doing well.  Overall feels she is handling things relatively well.  Follow.

## 2016-05-22 ENCOUNTER — Encounter: Admission: RE | Payer: Self-pay | Source: Ambulatory Visit

## 2016-05-22 ENCOUNTER — Ambulatory Visit
Admission: RE | Admit: 2016-05-22 | Payer: BLUE CROSS/BLUE SHIELD | Source: Ambulatory Visit | Admitting: Gastroenterology

## 2016-05-22 SURGERY — COLONOSCOPY WITH PROPOFOL
Anesthesia: General

## 2016-05-26 ENCOUNTER — Encounter: Admission: RE | Payer: Self-pay | Source: Ambulatory Visit

## 2016-05-26 ENCOUNTER — Ambulatory Visit
Admission: RE | Admit: 2016-05-26 | Payer: BLUE CROSS/BLUE SHIELD | Source: Ambulatory Visit | Admitting: Gastroenterology

## 2016-05-26 SURGERY — COLONOSCOPY WITH PROPOFOL
Anesthesia: General

## 2016-05-27 LAB — HM COLONOSCOPY

## 2016-06-19 ENCOUNTER — Encounter: Payer: Self-pay | Admitting: Internal Medicine

## 2016-06-19 DIAGNOSIS — D72829 Elevated white blood cell count, unspecified: Secondary | ICD-10-CM

## 2016-06-20 NOTE — Telephone Encounter (Signed)
Per my last note, needs f/u cbc to confirm white blood cell count normal.  Please schedule her for non fasting lab appt and notify pt.

## 2016-06-24 ENCOUNTER — Telehealth: Payer: Self-pay | Admitting: *Deleted

## 2016-06-24 DIAGNOSIS — D649 Anemia, unspecified: Secondary | ICD-10-CM

## 2016-06-24 NOTE — Telephone Encounter (Signed)
Orders placed for [facility].

## 2016-06-24 NOTE — Telephone Encounter (Signed)
Patient has requested to have her labs drawn at [facility] .  Pt contact [phone]

## 2016-06-24 NOTE — Telephone Encounter (Signed)
Let me know when labs are in and I will be happy to let patient know.

## 2016-06-25 NOTE — Telephone Encounter (Signed)
Patient informed entered will call if any questions

## 2016-06-27 LAB — IRON AND TIBC
Iron Saturation: 9 % — CL (ref 15–55)
Iron: 35 ug/dL (ref 27–159)
Total Iron Binding Capacity: 378 ug/dL (ref 250–450)
UIBC: 343 ug/dL (ref 131–425)

## 2016-06-27 LAB — CBC WITH DIFFERENTIAL/PLATELET
Basophils Absolute: 0 10*3/uL (ref 0.0–0.2)
Basos: 0 %
EOS (ABSOLUTE): 0 10*3/uL (ref 0.0–0.4)
Eos: 0 %
Hematocrit: 39 % (ref 34.0–46.6)
Hemoglobin: 12.9 g/dL (ref 11.1–15.9)
Immature Grans (Abs): 0 10*3/uL (ref 0.0–0.1)
Immature Granulocytes: 0 %
Lymphocytes Absolute: 1.3 10*3/uL (ref 0.7–3.1)
Lymphs: 12 %
MCH: 28.4 pg (ref 26.6–33.0)
MCHC: 33.1 g/dL (ref 31.5–35.7)
MCV: 86 fL (ref 79–97)
Monocytes Absolute: 0.6 10*3/uL (ref 0.1–0.9)
Monocytes: 6 %
Neutrophils Absolute: 8.5 10*3/uL — ABNORMAL HIGH (ref 1.4–7.0)
Neutrophils: 82 %
Platelets: 305 10*3/uL (ref 150–379)
RBC: 4.54 x10E6/uL (ref 3.77–5.28)
RDW: 15.7 % — ABNORMAL HIGH (ref 12.3–15.4)
WBC: 10.4 10*3/uL (ref 3.4–10.8)

## 2016-06-27 LAB — FERRITIN: Ferritin: 15 ng/mL (ref 15–150)

## 2016-06-28 ENCOUNTER — Other Ambulatory Visit: Payer: Self-pay | Admitting: Internal Medicine

## 2016-06-29 ENCOUNTER — Encounter: Payer: Self-pay | Admitting: Internal Medicine

## 2016-09-04 ENCOUNTER — Other Ambulatory Visit: Payer: Self-pay | Admitting: Internal Medicine

## 2016-09-26 ENCOUNTER — Ambulatory Visit (INDEPENDENT_AMBULATORY_CARE_PROVIDER_SITE_OTHER): Payer: BLUE CROSS/BLUE SHIELD | Admitting: Internal Medicine

## 2016-09-26 ENCOUNTER — Other Ambulatory Visit (HOSPITAL_COMMUNITY)
Admission: RE | Admit: 2016-09-26 | Discharge: 2016-09-26 | Disposition: A | Discharge status: Home / Self Care | Payer: BLUE CROSS/BLUE SHIELD | Source: Ambulatory Visit | Attending: Internal Medicine | Admitting: Internal Medicine

## 2016-09-26 ENCOUNTER — Encounter: Payer: Self-pay | Admitting: Internal Medicine

## 2016-09-26 VITALS — BP 118/68 | HR 80 | Temp 97.6°F | Resp 12 | Ht 65.0 in | Wt 155.2 lb

## 2016-09-26 DIAGNOSIS — R8290 Unspecified abnormal findings in urine: Secondary | ICD-10-CM

## 2016-09-26 DIAGNOSIS — Z1231 Encounter for screening mammogram for malignant neoplasm of breast: Secondary | ICD-10-CM | POA: Diagnosis not present

## 2016-09-26 DIAGNOSIS — G479 Sleep disorder, unspecified: Secondary | ICD-10-CM | POA: Diagnosis not present

## 2016-09-26 DIAGNOSIS — K50919 Crohn's disease, unspecified, with unspecified complications: Secondary | ICD-10-CM | POA: Diagnosis not present

## 2016-09-26 DIAGNOSIS — Z124 Encounter for screening for malignant neoplasm of cervix: Secondary | ICD-10-CM | POA: Diagnosis not present

## 2016-09-26 DIAGNOSIS — Z13220 Encounter for screening for lipoid disorders: Secondary | ICD-10-CM

## 2016-09-26 DIAGNOSIS — K219 Gastro-esophageal reflux disease without esophagitis: Secondary | ICD-10-CM | POA: Diagnosis not present

## 2016-09-26 DIAGNOSIS — F419 Anxiety disorder, unspecified: Secondary | ICD-10-CM

## 2016-09-26 DIAGNOSIS — Z0000 Encounter for general adult medical examination without abnormal findings: Secondary | ICD-10-CM

## 2016-09-26 DIAGNOSIS — Z1239 Encounter for other screening for malignant neoplasm of breast: Secondary | ICD-10-CM

## 2016-09-26 LAB — URINALYSIS, ROUTINE W REFLEX MICROSCOPIC
Bilirubin Urine: NEGATIVE
Hgb urine dipstick: NEGATIVE
Ketones, ur: NEGATIVE
Leukocytes, UA: NEGATIVE
Nitrite: NEGATIVE
Specific Gravity, Urine: <=1.005 — AB (ref 1.000–1.030)
Total Protein, Urine: NEGATIVE
Urine Glucose: NEGATIVE
Urobilinogen, UA: 0.2 (ref 0.0–1.0)
WBC, UA: NONE SEEN (ref 0–?)
pH: 7 (ref 5.0–8.0)

## 2016-09-26 LAB — LIPID PANEL
Cholesterol: 190 mg/dL (ref 0–200)
HDL: 75.4 mg/dL (ref >39.00)
LDL Cholesterol: 96 mg/dL (ref 0–99)
NonHDL: 114.14
Total CHOL/HDL Ratio: 3
Triglycerides: 92 mg/dL (ref 0.0–149.0)
VLDL: 18.4 mg/dL (ref 0.0–40.0)

## 2016-09-26 LAB — BASIC METABOLIC PANEL
BUN: 15 mg/dL (ref 6–23)
CO2: 33 mEq/L — ABNORMAL HIGH (ref 19–32)
Calcium: 11 mg/dL — ABNORMAL HIGH (ref 8.4–10.5)
Chloride: 102 mEq/L (ref 96–112)
Creatinine, Ser: 0.83 mg/dL (ref 0.40–1.20)
GFR: 74.47 mL/min (ref >60.00)
Glucose, Bld: 91 mg/dL (ref 70–99)
Potassium: 4.5 mEq/L (ref 3.5–5.1)
Sodium: 140 mEq/L (ref 135–145)

## 2016-09-26 LAB — VITAMIN D 25 HYDROXY (VIT D DEFICIENCY, FRACTURES): VITD: 39.92 ng/mL (ref 30.00–100.00)

## 2016-09-26 LAB — HEPATIC FUNCTION PANEL
ALT: 14 U/L (ref 0–35)
AST: 15 U/L (ref 0–37)
Albumin: 4.5 g/dL (ref 3.5–5.2)
Alkaline Phosphatase: 67 U/L (ref 39–117)
Bilirubin, Direct: 0.1 mg/dL (ref 0.0–0.3)
Total Bilirubin: 0.5 mg/dL (ref 0.2–1.2)
Total Protein: 7.1 g/dL (ref 6.0–8.3)

## 2016-09-26 LAB — CBC WITH DIFFERENTIAL/PLATELET
Basophils Absolute: 0 10*3/uL (ref 0.0–0.1)
Basophils Relative: 0.5 % (ref 0.0–3.0)
Eosinophils Absolute: 0.2 10*3/uL (ref 0.0–0.7)
Eosinophils Relative: 1.6 % (ref 0.0–5.0)
HCT: 41.8 % (ref 36.0–46.0)
Hemoglobin: 13.7 g/dL (ref 12.0–15.0)
Lymphocytes Relative: 23.4 % (ref 12.0–46.0)
Lymphs Abs: 2.3 10*3/uL (ref 0.7–4.0)
MCHC: 32.7 g/dL (ref 30.0–36.0)
MCV: 88.7 fl (ref 78.0–100.0)
Monocytes Absolute: 1 10*3/uL (ref 0.1–1.0)
Monocytes Relative: 9.7 % (ref 3.0–12.0)
Neutro Abs: 6.4 10*3/uL (ref 1.4–7.7)
Neutrophils Relative %: 64.8 % (ref 43.0–77.0)
Platelets: 304 10*3/uL (ref 150.0–400.0)
RBC: 4.72 Mil/uL (ref 3.87–5.11)
RDW: 14.4 % (ref 11.5–15.5)
WBC: 9.8 10*3/uL (ref 4.0–10.5)

## 2016-09-26 LAB — TSH: TSH: 1.21 u[IU]/mL (ref 0.35–4.50)

## 2016-09-26 LAB — HM PAP SMEAR

## 2016-09-26 MED ORDER — ZOLPIDEM TARTRATE 5 MG PO TABS: 2.5000 mg | ORAL_TABLET | Freq: Every evening | ORAL | 0 refills | Status: DC | PRN

## 2016-09-26 MED ORDER — BUPROPION HCL ER (XL) 150 MG PO TB24: 150.0000 mg | ORAL_TABLET | Freq: Every day | ORAL | 3 refills | Status: DC

## 2016-09-26 NOTE — Progress Notes (Signed)
Pre-visit discussion using our clinic review tool. No additional management support is needed unless otherwise documented below in the visit note.  

## 2016-09-26 NOTE — Progress Notes (Addendum)
Patient ID: THOMASINE KLUTTS, female   DOB: Feb 16, 1956, 61 y.o.   MRN: [id number]   Subjective:    Patient ID: [name], female    DOB: 01-29-1956, 61 y.o.   MRN: [id number]  HPI  Patient here for her physical exam.  She reports she is doing relatively well.  Her sister has recently been diagnosed with uterine cancer.  Increased stress with this and also with her husband's health issues.  Discussed with her today.  Overall she feels she is handling things relatively well.  No chest pain.  No acid reflux.  Breathing stable.  No abdominal pain.  Bowels stable.  Doing well on current regimen.  Discussed immunizations.     Past Medical History:  Diagnosis Date  . Chlamydia   . Crohn's disease ([place])    large intestine  . GERD (gastroesophageal reflux disease)    Past Surgical History:  Procedure Laterality Date  . laparoscopy and tuba repair    . orif right calcaneus    . TONSILECTOMY/ADENOIDECTOMY WITH MYRINGOTOMY     Family History  Problem Relation Age of Onset  . Lymphoma Father   . Asthma Mother   . Osteoporosis Mother   . Mental illness Brother        suicide  . Pancreatic cancer Unknown        uncle  . COPD Maternal Grandmother   . Breast cancer Neg Hx   . Colon cancer Neg Hx    Social History   Social History  . Marital status: Married    Spouse name: N/A  . Number of children: 0  . Years of education: N/A   Social History Main Topics  . Smoking status: Former Smoker    Quit date: 03/11/1999  . Smokeless tobacco: Never Used  . Alcohol use No  . Drug use: No  . Sexual activity: Not Asked   Other Topics Concern  . None   Social History Narrative  . None    Outpatient Encounter Prescriptions as of 09/26/2016  Medication Sig  . budesonide (ENTOCORT EC) 3 MG 24 hr capsule Take 9 mg by mouth daily.   Marland Kitchen buPROPion (WELLBUTRIN XL) 150 MG 24 hr tablet Take 1 tablet (150 mg total) by mouth daily.  . Calcium Carbonate-Vitamin D (CALCIUM 600+D) 600-400  MG-UNIT per tablet Take 1 tablet by mouth 2 (two) times daily.  . cholecalciferol (VITAMIN D) 400 UNITS TABS tablet Take 400 Units by mouth 3 (three) times a week.  . fluticasone (FLONASE) 50 MCG/ACT nasal spray SPRAY 2 SPRAYS INTO EACH NOSTRIL ONCE DAILY  . Mesalamine (ASACOL) 400 MG CPDR DR capsule Take 800 mg by mouth 2 (two) times daily.  . pantoprazole (PROTONIX) 40 MG tablet Take 1 tablet (40 mg total) by mouth daily.  . predniSONE (DELTASONE) 5 MG tablet Take 6 pills (25m) x 1 day and then decreased by one tablet per day until down to zero mg.  . zolpidem (AMBIEN) 5 MG tablet Take 0.5-1 tablets (2.5-5 mg total) by mouth at bedtime as needed.  . [DISCONTINUED] buPROPion (WELLBUTRIN XL) 150 MG 24 hr tablet TAKE 1 TABLET BY MOUTH ONCE DAILY.  . [DISCONTINUED] zolpidem (AMBIEN) 5 MG tablet TAKE 1/2 TO 1 TABLET BY MOUTH AT BEDTIME AS NEEDED   No facility-administered encounter medications on file as of 09/26/2016.     Review of Systems  Constitutional: Negative for appetite change and unexpected weight change.  HENT: Negative for congestion and sinus pressure.   Eyes:  Negative for pain and visual disturbance.  Respiratory: Negative for cough, chest tightness and shortness of breath.   Cardiovascular: Negative for chest pain, palpitations and leg swelling.  Gastrointestinal: Negative for abdominal pain, diarrhea, nausea and vomiting.  Genitourinary: Negative for difficulty urinating and dysuria.  Musculoskeletal: Negative for back pain and joint swelling.  Skin: Negative for color change and rash.  Neurological: Negative for dizziness, light-headedness and headaches.  Hematological: Negative for adenopathy. Does not bruise/bleed easily.  Psychiatric/Behavioral: Negative for agitation and dysphoric mood.       Increased stress as outlined.         Objective:    Physical Exam  Constitutional: She is oriented to person, place, and time. She appears well-developed and well-nourished. No  distress.  HENT:  Nose: Nose normal.  Mouth/Throat: Oropharynx is clear and moist.  Eyes: Right eye exhibits no discharge. Left eye exhibits no discharge. No scleral icterus.  Neck: Neck supple. No thyromegaly present.  Cardiovascular: Normal rate and regular rhythm.   Pulmonary/Chest: Breath sounds normal. No accessory muscle usage. No tachypnea. No respiratory distress. She has no decreased breath sounds. She has no wheezes. She has no rhonchi. Right breast exhibits no inverted nipple, no mass, no nipple discharge and no tenderness (no axillary adenopathy). Left breast exhibits no inverted nipple, no mass, no nipple discharge and no tenderness (no axilarry adenopathy).  Abdominal: Soft. Bowel sounds are normal. There is no tenderness.  Genitourinary:  Genitourinary Comments: Normal external genitalia.  Vaginal vault without lesions.  Cervix identified.  Pap smear performed.  Stenotic os.  Could not appreciate any adnexal masses or tenderness.    Musculoskeletal: She exhibits no edema or tenderness.  Lymphadenopathy:    She has no cervical adenopathy.  Neurological: She is alert and oriented to person, place, and time.  Skin: Skin is warm. No rash noted. No erythema.  Psychiatric: She has a normal mood and affect. Her behavior is normal.    BP 118/68 (BP Location: Left Arm, Patient Position: Sitting, Cuff Size: Normal)   Pulse 80   Temp 97.6 F (36.4 C) (Oral)   Resp 12   Ht 5' 5"  (1.651 m)   Wt 155 lb 3.2 oz (70.4 kg)   LMP 04/12/2010   SpO2 98%   BMI 25.83 kg/m  Wt Readings from Last 3 Encounters:  09/26/16 155 lb 3.2 oz (70.4 kg)  04/18/16 150 lb 8 oz (68.3 kg)  12/14/15 148 lb 3.2 oz (67.2 kg)     Lab Results  Component Value Date   WBC 9.8 09/26/2016   HGB 13.7 09/26/2016   HCT 41.8 09/26/2016   PLT 304.0 09/26/2016   GLUCOSE 91 09/26/2016   CHOL 190 09/26/2016   TRIG 92.0 09/26/2016   HDL 75.40 09/26/2016   LDLCALC 96 09/26/2016   ALT 14 09/26/2016   AST 15  09/26/2016   NA 140 09/26/2016   K 4.5 09/26/2016   CL 102 09/26/2016   CREATININE 0.83 09/26/2016   BUN 15 09/26/2016   CO2 33 (H) 09/26/2016   TSH 1.21 09/26/2016    Mm Digital Screening Bilateral  Result Date: 01/07/2016 CLINICAL DATA:  Screening. EXAM: DIGITAL SCREENING BILATERAL MAMMOGRAM WITH CAD COMPARISON:  Previous exam(s). ACR Breast Density Category c: The breast tissue is heterogeneously dense, which may obscure small masses. FINDINGS: There are no findings suspicious for malignancy. Images were processed with CAD. IMPRESSION: No mammographic evidence of malignancy. A result letter of this screening mammogram will be mailed directly to the  patient. RECOMMENDATION: Screening mammogram in one year. (Code:SM-B-01Y) BI-RADS CATEGORY  1: Negative. Electronically Signed   By: Curlene Dolphin M.D.   On: 01/07/2016 16:52       Assessment & Plan:   Problem List Items Addressed This Visit    Anxiety    Increased stress as outlined.  On wellbutrin.  Doing well on this medication.  Does not feel needs anything more.  Follow.        Relevant Medications   buPROPion (WELLBUTRIN XL) 150 MG 24 hr tablet   Crohn's disease ([place])    Currently stable.  Doing well on current regimen.  Followed by GI.        Relevant Orders   CBC with Differential/Platelet (Completed)   Hepatic function panel (Completed)   TSH (Completed)   VITAMIN D 25 Hydroxy (Vit-D Deficiency, Fractures) (Completed)   Basic metabolic panel (Completed)   GERD (gastroesophageal reflux disease)    Controlled on protonix.       Health care maintenance    Physical today 09/26/16.  PAP 05/22/14- negative with negative HPV.  Colonoscopy 05/27/16 - internal hemorrhoids, pseudopolyps as outlined in report.   Mammogram 01/07/16- Birads I.  Repeat pap 09/26/16.       Sleeping difficulty    Uses ambien prn.  Follow.         Other Visit Diagnoses    Routine general medical examination at a health care facility    -  Primary     Screening for breast cancer       Relevant Orders   MM DIGITAL SCREENING BILATERAL   Screening for cervical cancer       Relevant Orders   Cytology - PAP   Screening cholesterol level       Relevant Orders   Lipid panel (Completed)   Bad odor of urine       Check urinalysis to confirm no infection.     Relevant Orders   Urinalysis, Routine w reflex microscopic (Completed)   Urine Culture (Completed)       [name], MD

## 2016-09-26 NOTE — Assessment & Plan Note (Addendum)
Physical today 09/26/16.  PAP 05/22/14- negative with negative HPV.  Colonoscopy 05/27/16 - internal hemorrhoids, pseudopolyps as outlined in report.   Mammogram 01/07/16- Birads I.  Repeat pap 09/26/16.

## 2016-09-27 LAB — URINE CULTURE: Organism ID, Bacteria: NO GROWTH

## 2016-09-28 ENCOUNTER — Encounter: Payer: Self-pay | Admitting: Internal Medicine

## 2016-09-28 ENCOUNTER — Other Ambulatory Visit: Payer: Self-pay | Admitting: Internal Medicine

## 2016-09-28 NOTE — Assessment & Plan Note (Signed)
Increased stress as outlined.  On wellbutrin.  Doing well on this medication.  Does not feel needs anything more.  Follow.

## 2016-09-28 NOTE — Progress Notes (Signed)
Order placed for f/u calcium.

## 2016-09-28 NOTE — Assessment & Plan Note (Signed)
Uses ambien prn.  Follow.

## 2016-09-28 NOTE — Assessment & Plan Note (Signed)
Controlled on protonix.   

## 2016-09-28 NOTE — Assessment & Plan Note (Signed)
Currently stable.  Doing well on current regimen.  Followed by GI.

## 2016-09-29 LAB — CYTOLOGY - PAP
Diagnosis: NEGATIVE
HPV: NOT DETECTED

## 2016-09-30 ENCOUNTER — Telehealth: Payer: Self-pay | Admitting: Internal Medicine

## 2016-09-30 ENCOUNTER — Encounter: Payer: Self-pay | Admitting: Internal Medicine

## 2016-09-30 NOTE — Telephone Encounter (Signed)
Orders placed for [employer] labs.

## 2016-10-15 ENCOUNTER — Encounter: Payer: Self-pay | Admitting: Internal Medicine

## 2016-10-15 LAB — CALCIUM: Calcium: 10.1 mg/dL (ref 8.7–10.3)

## 2016-10-15 LAB — CALCIUM, IONIZED: Calcium, Ion: 5.2 mg/dL (ref 4.5–5.6)

## 2017-01-21 ENCOUNTER — Other Ambulatory Visit: Payer: Self-pay | Admitting: Internal Medicine

## 2017-02-04 ENCOUNTER — Other Ambulatory Visit: Payer: Self-pay | Admitting: Internal Medicine

## 2017-03-08 ENCOUNTER — Other Ambulatory Visit: Payer: Self-pay | Admitting: Internal Medicine

## 2017-03-30 ENCOUNTER — Encounter: Payer: Self-pay | Admitting: Internal Medicine

## 2017-03-30 ENCOUNTER — Ambulatory Visit: Payer: 59 | Admitting: Internal Medicine

## 2017-03-30 VITALS — BP 128/70 | HR 83 | Temp 98.3°F | Resp 14 | Wt 153.6 lb

## 2017-03-30 DIAGNOSIS — G479 Sleep disorder, unspecified: Secondary | ICD-10-CM

## 2017-03-30 DIAGNOSIS — K50919 Crohn's disease, unspecified, with unspecified complications: Secondary | ICD-10-CM

## 2017-03-30 DIAGNOSIS — M8580 Other specified disorders of bone density and structure, unspecified site: Secondary | ICD-10-CM | POA: Diagnosis not present

## 2017-03-30 DIAGNOSIS — E2839 Other primary ovarian failure: Secondary | ICD-10-CM

## 2017-03-30 DIAGNOSIS — K219 Gastro-esophageal reflux disease without esophagitis: Secondary | ICD-10-CM

## 2017-03-30 DIAGNOSIS — F419 Anxiety disorder, unspecified: Secondary | ICD-10-CM

## 2017-03-30 MED ORDER — BUPROPION HCL ER (XL) 150 MG PO TB24
150.0000 mg | ORAL_TABLET | Freq: Every day | ORAL | 3 refills | Status: DC
Start: 2017-03-30 — End: 2017-07-02

## 2017-03-30 MED ORDER — ZOLPIDEM TARTRATE 5 MG PO TABS: 2.5000 mg | ORAL_TABLET | Freq: Every evening | ORAL | 2 refills | Status: DC | PRN

## 2017-03-30 NOTE — Progress Notes (Signed)
Patient ID: Lauren Chang, female   DOB: 10-18-1955, 62 y.o.   MRN: [id number]   Subjective:    Patient ID: Lauren Chang, female    DOB: 05-Aug-1955, 62 y.o.   MRN: [id number]  HPI  Patient here for a scheduled follow up.  Increased stress with her husband's health issues.  He has been placed on the transplant list.  Discussed with her today.  She feels she is handling things ok.  Stays active.  Working.  No chest pain.  No sob.  No acid reflux.  No abdominal pain.  Bowels doing well. No recent flares.  Discussed need for bone density.     Past Medical History:  Diagnosis Date  . Chlamydia   . Crohn's disease ([place])    large intestine  . GERD (gastroesophageal reflux disease)    Past Surgical History:  Procedure Laterality Date  . laparoscopy and tuba repair    . orif right calcaneus    . TONSILECTOMY/ADENOIDECTOMY WITH MYRINGOTOMY     Family History  Problem Relation Age of Onset  . Lymphoma Father   . Asthma Mother   . Osteoporosis Mother   . Mental illness Brother        suicide  . Pancreatic cancer Unknown        uncle  . COPD Maternal Grandmother   . Breast cancer Neg Hx   . Colon cancer Neg Hx    Social History   Socioeconomic History  . Marital status: Married    Spouse name: None  . Number of children: 0  . Years of education: None  . Highest education level: None  Social Needs  . Financial resource strain: None  . Food insecurity - worry: None  . Food insecurity - inability: None  . Transportation needs - medical: None  . Transportation needs - non-medical: None  Occupational History  . None  Tobacco Use  . Smoking status: Former Smoker    Last attempt to quit: 03/11/1999    Years since quitting: 18.0  . Smokeless tobacco: Never Used  Substance and Sexual Activity  . Alcohol use: No    Alcohol/week: 0.0 oz  . Drug use: No  . Sexual activity: None  Other Topics Concern  . None  Social History Narrative  . None    Outpatient Encounter  Medications as of 03/30/2017  Medication Sig  . budesonide (ENTOCORT EC) 3 MG 24 hr capsule Take 9 mg by mouth daily.   Marland Kitchen buPROPion (WELLBUTRIN XL) 150 MG 24 hr tablet Take 1 tablet (150 mg total) by mouth daily.  . Calcium Carbonate-Vitamin D (CALCIUM 600+D) 600-400 MG-UNIT per tablet Take 1 tablet by mouth 2 (two) times daily.  . cholecalciferol (VITAMIN D) 400 UNITS TABS tablet Take 400 Units by mouth 3 (three) times a week.  . fluticasone (FLONASE) 50 MCG/ACT nasal spray SPRAY 2 SPRAYS INTO EACH NOSTRIL ONCE DAILY  . Mesalamine (ASACOL) 400 MG CPDR DR capsule Take 800 mg by mouth 2 (two) times daily.  . pantoprazole (PROTONIX) 40 MG tablet Take 1 tablet (40 mg total) by mouth daily.  . predniSONE (DELTASONE) 5 MG tablet Take 6 pills (63m) x 1 day and then decreased by one tablet per day until down to zero mg.  . zolpidem (AMBIEN) 5 MG tablet Take 0.5-1 tablets (2.5-5 mg total) by mouth at bedtime as needed.  . [DISCONTINUED] buPROPion (WELLBUTRIN XL) 150 MG 24 hr tablet TAKE 1 TABLET BY MOUTH ONCE DAILY.  . [DISCONTINUED]  zolpidem (AMBIEN) 5 MG tablet TAKE 1/2 TO 1 TABLET BY MOUTH AT BEDTIME AS NEEDED   No facility-administered encounter medications on file as of 03/30/2017.     Review of Systems  Constitutional: Negative for appetite change and unexpected weight change.  HENT: Negative for congestion and sinus pressure.   Respiratory: Negative for cough, chest tightness and shortness of breath.   Cardiovascular: Negative for chest pain, palpitations and leg swelling.  Gastrointestinal: Negative for abdominal pain, diarrhea, nausea and vomiting.  Genitourinary: Negative for difficulty urinating and dysuria.  Musculoskeletal: Negative for joint swelling and myalgias.  Skin: Negative for color change and rash.  Neurological: Negative for dizziness, light-headedness and headaches.  Psychiatric/Behavioral: Negative for agitation and dysphoric mood.       Increased stress as outlined.          Objective:     Blood pressure rechecked by me:  130/72  Physical Exam  Constitutional: She appears well-developed and well-nourished. No distress.  HENT:  Nose: Nose normal.  Mouth/Throat: Oropharynx is clear and moist.  Neck: Neck supple. No thyromegaly present.  Cardiovascular: Normal rate and regular rhythm.  Pulmonary/Chest: Breath sounds normal. No respiratory distress. She has no wheezes.  Abdominal: Soft. Bowel sounds are normal. There is no tenderness.  Musculoskeletal: She exhibits no edema or tenderness.  Lymphadenopathy:    She has no cervical adenopathy.  Skin: No rash noted. No erythema.  Psychiatric: She has a normal mood and affect. Her behavior is normal.    BP 128/70 (BP Location: Left Arm, Patient Position: Sitting, Cuff Size: Normal)   Pulse 83   Temp 98.3 F (36.8 C) (Oral)   Resp 14   Wt 153 lb 9.6 oz (69.7 kg)   LMP 04/12/2010   SpO2 96%   BMI 25.56 kg/m  Wt Readings from Last 3 Encounters:  03/30/17 153 lb 9.6 oz (69.7 kg)  09/26/16 155 lb 3.2 oz (70.4 kg)  04/18/16 150 lb 8 oz (68.3 kg)     Lab Results  Component Value Date   WBC 9.8 09/26/2016   HGB 13.7 09/26/2016   HCT 41.8 09/26/2016   PLT 304.0 09/26/2016   GLUCOSE 91 09/26/2016   CHOL 190 09/26/2016   TRIG 92.0 09/26/2016   HDL 75.40 09/26/2016   LDLCALC 96 09/26/2016   ALT 14 09/26/2016   AST 15 09/26/2016   NA 140 09/26/2016   K 4.5 09/26/2016   CL 102 09/26/2016   CREATININE 0.83 09/26/2016   BUN 15 09/26/2016   CO2 33 (H) 09/26/2016   TSH 1.21 09/26/2016       Assessment & Plan:   Problem List Items Addressed This Visit    Anxiety    Increased stress as outlined.  On wellbutrin.  Doing well. Does not feel needs any further intervention.  Follow.        Relevant Medications   buPROPion (WELLBUTRIN XL) 150 MG 24 hr tablet   Crohn's disease ([place])    Doing well on current regimen.  Stable.  Followed by GI.       GERD (gastroesophageal reflux disease)     Controlled on protonix.        Osteopenia    Weight bearing exercise.  Dietary calcium.  Recheck bone density.        Sleeping difficulty    Takes ambien.  Follow.         Other Visit Diagnoses    Estrogen deficiency    -  Primary  Relevant Orders   DG Bone Density       [name], MD

## 2017-04-02 ENCOUNTER — Encounter: Payer: Self-pay | Admitting: Internal Medicine

## 2017-04-02 NOTE — Assessment & Plan Note (Signed)
Doing well on current regimen.  Stable.  Followed by GI.

## 2017-04-02 NOTE — Assessment & Plan Note (Signed)
Weight bearing exercise.  Dietary calcium.  Recheck bone density.

## 2017-04-02 NOTE — Assessment & Plan Note (Signed)
Controlled on protonix.   

## 2017-04-02 NOTE — Assessment & Plan Note (Signed)
Increased stress as outlined.  On wellbutrin.  Doing well. Does not feel needs any further intervention.  Follow.

## 2017-04-02 NOTE — Assessment & Plan Note (Signed)
Takes ambien.  Follow.

## 2017-06-15 ENCOUNTER — Ambulatory Visit
Admission: RE | Admit: 2017-06-15 | Discharge: 2017-06-15 | Disposition: A | Discharge status: Home / Self Care | Payer: 59 | Source: Ambulatory Visit | Attending: Internal Medicine | Admitting: Internal Medicine

## 2017-06-15 DIAGNOSIS — Z1231 Encounter for screening mammogram for malignant neoplasm of breast: Secondary | ICD-10-CM | POA: Insufficient documentation

## 2017-06-15 DIAGNOSIS — E2839 Other primary ovarian failure: Secondary | ICD-10-CM | POA: Diagnosis present

## 2017-06-15 DIAGNOSIS — M810 Age-related osteoporosis without current pathological fracture: Secondary | ICD-10-CM | POA: Insufficient documentation

## 2017-06-15 DIAGNOSIS — Z1239 Encounter for other screening for malignant neoplasm of breast: Secondary | ICD-10-CM

## 2017-06-19 ENCOUNTER — Other Ambulatory Visit: Payer: Self-pay | Admitting: Internal Medicine

## 2017-07-02 ENCOUNTER — Other Ambulatory Visit: Payer: Self-pay | Admitting: Internal Medicine

## 2017-07-07 ENCOUNTER — Ambulatory Visit: Payer: 59 | Admitting: Internal Medicine

## 2017-07-07 ENCOUNTER — Encounter: Payer: Self-pay | Admitting: Internal Medicine

## 2017-07-07 VITALS — BP 122/78 | HR 70 | Temp 97.7°F | Resp 16 | Wt 151.0 lb

## 2017-07-07 DIAGNOSIS — K219 Gastro-esophageal reflux disease without esophagitis: Secondary | ICD-10-CM

## 2017-07-07 DIAGNOSIS — K50919 Crohn's disease, unspecified, with unspecified complications: Secondary | ICD-10-CM

## 2017-07-07 DIAGNOSIS — F419 Anxiety disorder, unspecified: Secondary | ICD-10-CM | POA: Diagnosis not present

## 2017-07-07 DIAGNOSIS — M810 Age-related osteoporosis without current pathological fracture: Secondary | ICD-10-CM

## 2017-07-07 DIAGNOSIS — G479 Sleep disorder, unspecified: Secondary | ICD-10-CM | POA: Diagnosis not present

## 2017-07-07 LAB — HEPATIC FUNCTION PANEL
ALT: 15 U/L (ref 0–35)
AST: 18 U/L (ref 0–37)
Albumin: 4.4 g/dL (ref 3.5–5.2)
Alkaline Phosphatase: 65 U/L (ref 39–117)
Bilirubin, Direct: 0.1 mg/dL (ref 0.0–0.3)
Total Bilirubin: 0.4 mg/dL (ref 0.2–1.2)
Total Protein: 7 g/dL (ref 6.0–8.3)

## 2017-07-07 LAB — URINALYSIS, ROUTINE W REFLEX MICROSCOPIC
Bilirubin Urine: NEGATIVE
Hgb urine dipstick: NEGATIVE
Ketones, ur: NEGATIVE
Leukocytes, UA: NEGATIVE
Nitrite: NEGATIVE
Specific Gravity, Urine: 1.015 (ref 1.000–1.030)
Total Protein, Urine: NEGATIVE
Urine Glucose: NEGATIVE
Urobilinogen, UA: 0.2 (ref 0.0–1.0)
pH: 5.5 (ref 5.0–8.0)

## 2017-07-07 LAB — BASIC METABOLIC PANEL
BUN: 14 mg/dL (ref 6–23)
CO2: 34 mEq/L — ABNORMAL HIGH (ref 19–32)
Calcium: 10 mg/dL (ref 8.4–10.5)
Chloride: 101 mEq/L (ref 96–112)
Creatinine, Ser: 0.77 mg/dL (ref 0.40–1.20)
GFR: 80.99 mL/min (ref >60.00)
Glucose, Bld: 94 mg/dL (ref 70–99)
Potassium: 4.4 mEq/L (ref 3.5–5.1)
Sodium: 140 mEq/L (ref 135–145)

## 2017-07-07 MED ORDER — FLUTICASONE PROPIONATE 50 MCG/ACT NA SUSP
NASAL | 5 refills | Status: DC
Start: 2017-07-07 — End: 2018-10-22

## 2017-07-07 NOTE — Progress Notes (Signed)
Patient ID: Lauren Chang, female   DOB: 1955-11-17, 62 y.o.   MRN: [id number]   Subjective:    Patient ID: Lauren Chang, female    DOB: [birth year], 62 y.o.   MRN: [id number]  HPI  Patient here for a scheduled follow up.  She reports she is doing relatively well.  Tries to stay active.  No chest pain.  No sob.  No acid reflux.  No abdominal pain or cramping.  Bowels moving.  Still dealing with increased stress.  Stress with her husband's health issues and work.  Overall she feels she is handling things relatively well.  Does not feel needs any further intervention.  Discussed bone density results - osteoporosis.  Discussed treatment options.  She desires - reclast.     Past Medical History:  Diagnosis Date  . Chlamydia   . Crohn's disease ([place])    large intestine  . GERD (gastroesophageal reflux disease)    Past Surgical History:  Procedure Laterality Date  . laparoscopy and tuba repair    . orif right calcaneus    . TONSILECTOMY/ADENOIDECTOMY WITH MYRINGOTOMY     Family History  Problem Relation Age of Onset  . Lymphoma Father   . Asthma Mother   . Osteoporosis Mother   . Mental illness Brother        suicide  . Pancreatic cancer Unknown        uncle  . COPD Maternal Grandmother   . Breast cancer Neg Hx   . Colon cancer Neg Hx    Social History   Socioeconomic History  . Marital status: Married    Spouse name: Not on file  . Number of children: 0  . Years of education: Not on file  . Highest education level: Not on file  Occupational History  . Not on file  Social Needs  . Financial resource strain: Not on file  . Food insecurity:    Worry: Not on file    Inability: Not on file  . Transportation needs:    Medical: Not on file    Non-medical: Not on file  Tobacco Use  . Smoking status: Former Smoker    Last attempt to quit: 03/11/1999    Years since quitting: 18.3  . Smokeless tobacco: Never Used  Substance and Sexual Activity  . Alcohol use: No   Alcohol/week: 0.0 oz  . Drug use: No  . Sexual activity: Not on file  Lifestyle  . Physical activity:    Days per week: Not on file    Minutes per session: Not on file  . Stress: Not on file  Relationships  . Social connections:    Talks on phone: Not on file    Gets together: Not on file    Attends religious service: Not on file    Active member of club or organization: Not on file    Attends meetings of clubs or organizations: Not on file    Relationship status: Not on file  Other Topics Concern  . Not on file  Social History Narrative  . Not on file    Outpatient Encounter Medications as of 07/07/2017  Medication Sig  . budesonide (ENTOCORT EC) 3 MG 24 hr capsule Take 9 mg by mouth daily.   Marland Kitchen buPROPion (WELLBUTRIN XL) 150 MG 24 hr tablet TAKE 1 TABLET BY MOUTH ONCE DAILY.  . cholecalciferol (VITAMIN D) 400 UNITS TABS tablet Take 400 Units by mouth 3 (three) times a week.  . fluticasone (FLONASE)  50 MCG/ACT nasal spray SPRAY 2 SPRAYS INTO EACH NOSTRIL ONCE DAILY  . LIALDA 1.2 g EC tablet   . pantoprazole (PROTONIX) 40 MG tablet Take 1 tablet (40 mg total) by mouth daily.  . predniSONE (DELTASONE) 5 MG tablet Take 6 pills (32m) x 1 day and then decreased by one tablet per day until down to zero mg.  . zolpidem (AMBIEN) 5 MG tablet Take 0.5-1 tablets (2.5-5 mg total) by mouth at bedtime as needed.  . [DISCONTINUED] Calcium Carbonate-Vitamin D (CALCIUM 600+D) 600-400 MG-UNIT per tablet Take 1 tablet by mouth 2 (two) times daily.  . [DISCONTINUED] fluticasone (FLONASE) 50 MCG/ACT nasal spray SPRAY 2 SPRAYS INTO EACH NOSTRIL ONCE DAILY  . [DISCONTINUED] Mesalamine (ASACOL) 400 MG CPDR DR capsule Take 800 mg by mouth 2 (two) times daily.   No facility-administered encounter medications on file as of 07/07/2017.     Review of Systems  Constitutional: Negative for appetite change and unexpected weight change.  HENT: Negative for congestion and sinus pressure.   Respiratory:  Negative for cough, chest tightness and shortness of breath.   Cardiovascular: Negative for chest pain, palpitations and leg swelling.  Gastrointestinal: Negative for abdominal pain, nausea and vomiting.  Genitourinary: Negative for difficulty urinating and dysuria.  Musculoskeletal: Negative for joint swelling and myalgias.  Skin: Negative for color change and rash.  Neurological: Negative for dizziness, light-headedness and headaches.  Psychiatric/Behavioral: Negative for agitation and dysphoric mood.       Increased stress as outlined.         Objective:    Physical Exam  Constitutional: She appears well-developed and well-nourished. No distress.  HENT:  Nose: Nose normal.  Mouth/Throat: Oropharynx is clear and moist.  Neck: Neck supple. No thyromegaly present.  Cardiovascular: Normal rate and regular rhythm.  Pulmonary/Chest: Breath sounds normal. No respiratory distress. She has no wheezes.  Abdominal: Soft. Bowel sounds are normal. There is no tenderness.  Musculoskeletal: She exhibits no edema or tenderness.  Lymphadenopathy:    She has no cervical adenopathy.  Skin: No rash noted. No erythema.  Psychiatric: She has a normal mood and affect. Her behavior is normal.    BP 122/78 (BP Location: Left Arm, Patient Position: Sitting, Cuff Size: Normal)   Pulse 70   Temp 97.7 F (36.5 C) (Oral)   Resp 16   Wt 151 lb (68.5 kg)   LMP 04/12/2010   SpO2 98%   BMI 25.13 kg/m  Wt Readings from Last 3 Encounters:  07/07/17 151 lb (68.5 kg)  03/30/17 153 lb 9.6 oz (69.7 kg)  09/26/16 155 lb 3.2 oz (70.4 kg)     Lab Results  Component Value Date   WBC 9.8 09/26/2016   HGB 13.7 09/26/2016   HCT 41.8 09/26/2016   PLT 304.0 09/26/2016   GLUCOSE 94 07/07/2017   CHOL 190 09/26/2016   TRIG 92.0 09/26/2016   HDL 75.40 09/26/2016   LDLCALC 96 09/26/2016   ALT 15 07/07/2017   AST 18 07/07/2017   NA 140 07/07/2017   K 4.4 07/07/2017   CL 101 07/07/2017   CREATININE 0.77  07/07/2017   BUN 14 07/07/2017   CO2 34 (H) 07/07/2017   TSH 0.75 07/07/2017    Dg Bone Density  Result Date: 06/15/2017 EXAM: DUAL X-RAY ABSORPTIOMETRY (DXA) FOR BONE MINERAL DENSITY IMPRESSION: Dear Dr. SNicki Reaper Your patient SChasey Dullcompleted a BMD test on 06/15/2017 using the [employer]bridge(analysis version: 14.10) manufactured by [employer] The following summarizes  the results of our evaluation. PATIENT BIOGRAPHICAL: Name: Arantza, Darrington Patient ID: [id number] Birth Date: 12-26-1955 Height: 65.0 in. Gender: Female Exam Date: 06/15/2017 Weight: 151.3 lbs. Indications: Caucasian, Postmenopausal Fractures: Right ankle Treatments: Vitamin D ASSESSMENT: The BMD measured at Femur Neck Right is 0.676 g/cm2 with a T-score of -2.6. This patient is considered OSTEOPOROTIC according to [facility]) criteria. L-3 was excluded due to degenerative changes. Site Region Measured Measured WHO Young Adult BMD Date       Age      Classification T-score AP Spine L1-L4 (L3) 06/15/2017 60.9 Osteopenia -1.5 0.995 g/cm2 DualFemur Neck Right 06/15/2017 60.9 Osteoporosis -2.6 0.676 g/cm2 World Health Organization [facility]) criteria for post-menopausal, Caucasian Women: Normal:       T-score at or above -1 SD Osteopenia:   T-score between -1 and -2.5 SD Osteoporosis: T-score at or below -2.5 SD RECOMMENDATIONS: 1. All patients should optimize calcium and vitamin D intake. 2. Consider FDA-approved medical therapies in postmenopausal women and men aged 30 years and older, based on the following: a. A hip or vertebral(clinical or morphometric) fracture b. T-score < -2.5 at the femoral neck or spine after appropriate evaluation to exclude secondary causes c. Low bone mass (T-score between -1.0 and -2.5 at the femoral neck or spine) and a 10-year probability of a hip fracture > 3% or a 10-year probability of a major osteoporosis-related fracture > 20% based on the [place]-adapted WHO algorithm d. Clinician  judgment and/or patient preferences may indicate treatment for people with 10-year fracture probabilities above or below these levels FOLLOW-UP: People with diagnosed cases of osteoporosis or at high risk for fracture should have regular bone mineral density tests. For patients eligible for Medicare, routine testing is allowed once every 2 years. The testing frequency can be increased to one year for patients who have rapidly progressing disease, those who are receiving or discontinuing medical therapy to restore bone mass, or have additional risk factors. I have reviewed this report, and agree with the above findings. Mark A. Thornton Papas, M.D. [facility] Radiology Electronically Signed   By: Lavonia Dana M.D.   On: 06/15/2017 14:49   Mm Digital Screening Bilateral  Result Date: 06/16/2017 CLINICAL DATA:  Screening. EXAM: DIGITAL SCREENING BILATERAL MAMMOGRAM WITH CAD COMPARISON:  Previous exam(s). ACR Breast Density Category c: The breast tissue is heterogeneously dense, which may obscure small masses. FINDINGS: There are no findings suspicious for malignancy. Images were processed with CAD. IMPRESSION: No mammographic evidence of malignancy. A result letter of this screening mammogram will be mailed directly to the patient. RECOMMENDATION: Screening mammogram in one year. (Code:SM-B-01Y) BI-RADS CATEGORY  1: Negative. Electronically Signed   By: Nolon Nations M.D.   On: 06/16/2017 09:09       Assessment & Plan:   Problem List Items Addressed This Visit    Anxiety    Overall she feels she is handling things relatively wel.  On wellbutrin.  Stable.  Follow.        Crohn's disease ([place])    Doing well on current regimen.  Stable.  Followed by GI.       Relevant Orders   Hepatic function panel (Completed)   GERD (gastroesophageal reflux disease)    Controlled on current regimen.  Follow.        Relevant Medications   LIALDA 1.2 g EC tablet   Osteoporosis - Primary    Discussed bone density  results and treatment options.  She desired to pursue reclast treatment.  Obtain labs.        Relevant Orders   TSH (Completed)   Basic metabolic panel (Completed)   Urinalysis, Routine w reflex microscopic (Completed)   Sleeping difficulty    Takes ambien prn.  Follow.            [name], MD

## 2017-07-08 ENCOUNTER — Other Ambulatory Visit: Payer: Self-pay | Admitting: Internal Medicine

## 2017-07-08 ENCOUNTER — Encounter: Payer: Self-pay | Admitting: Internal Medicine

## 2017-07-08 ENCOUNTER — Other Ambulatory Visit (INDEPENDENT_AMBULATORY_CARE_PROVIDER_SITE_OTHER): Payer: 59

## 2017-07-08 DIAGNOSIS — M810 Age-related osteoporosis without current pathological fracture: Secondary | ICD-10-CM | POA: Diagnosis not present

## 2017-07-08 LAB — TSH: TSH: 0.75 u[IU]/mL (ref 0.35–4.50)

## 2017-07-08 LAB — VITAMIN D 25 HYDROXY (VIT D DEFICIENCY, FRACTURES): VITD: 27.96 ng/mL — ABNORMAL LOW (ref 30.00–100.00)

## 2017-07-08 NOTE — Progress Notes (Signed)
Order placed for add on vitamin D.

## 2017-07-13 NOTE — Assessment & Plan Note (Signed)
Discussed bone density results and treatment options.  She desired to pursue reclast treatment.  Obtain labs.

## 2017-07-13 NOTE — Assessment & Plan Note (Signed)
Doing well on current regimen.  Stable.  Followed by GI.

## 2017-07-13 NOTE — Assessment & Plan Note (Signed)
Takes ambien prn.  Follow.

## 2017-07-13 NOTE — Assessment & Plan Note (Signed)
Controlled on current regimen.  Follow.  

## 2017-07-13 NOTE — Assessment & Plan Note (Signed)
Overall she feels she is handling things relatively wel.  On wellbutrin.  Stable.  Follow.

## 2017-08-18 ENCOUNTER — Telehealth: Payer: Self-pay

## 2017-08-18 NOTE — Telephone Encounter (Signed)
Copied from [place] [id number]. Topic: General - Other >> Aug 18, 2017  4:42 PM Yvette Rack wrote: Reason for CRM: Birdie Riddle with [facility] states pt refused reclass . Cb# [phone]

## 2017-08-18 NOTE — Telephone Encounter (Signed)
FYI

## 2017-09-04 ENCOUNTER — Other Ambulatory Visit: Payer: Self-pay | Admitting: Internal Medicine

## 2017-10-11 ENCOUNTER — Encounter: Payer: Self-pay | Admitting: Internal Medicine

## 2017-10-16 IMAGING — CT CT ABD-PELV W/ CM
1 of 3 series · 14 of 32 positions shown, 19 images · IV contrast (APPLIED)
Comparison: None

CLINICAL DATA: History Crohn disease for 25 years. Evaluate small
bowel. Crohn's disease of colon without complication. History
gastroesophageal reflux disease.

EXAM:
CT ABDOMEN AND PELVIS WITH CONTRAST
TECHNIQUE: Multidetector CT imaging of the abdomen and pelvis was performed
using the standard protocol following bolus administration of
intravenous contrast.
CONTRAST:  100mL [id number] IOPAMIDOL ([id number]) INJECTION 61%

[Series 2: axial st · axial · 0.62mm/px · z∈[-973,-563]mm · 14 of 92 slices shown, 19 images]
[im 5/92  soft-tissue]
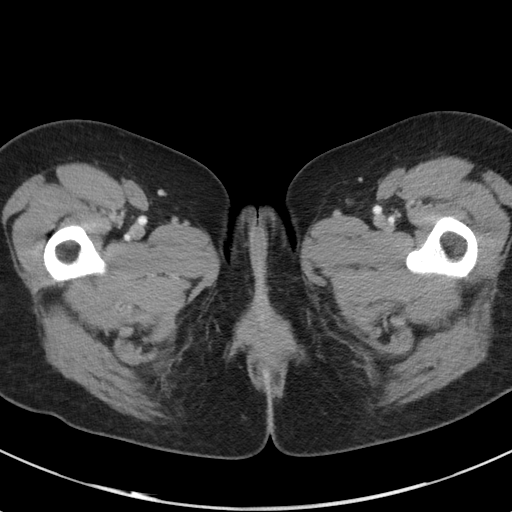
[im 5/92  bone]
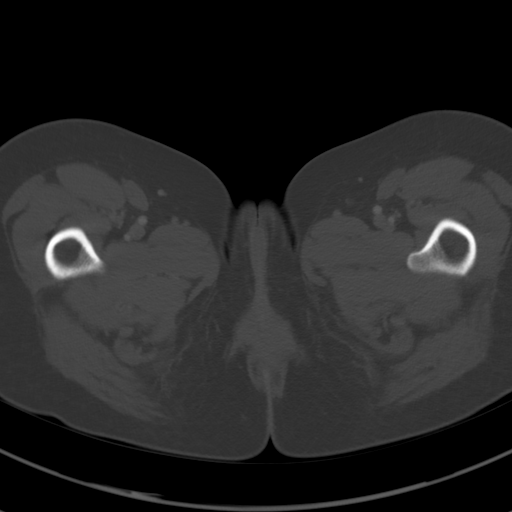
[im 15/92  soft-tissue]
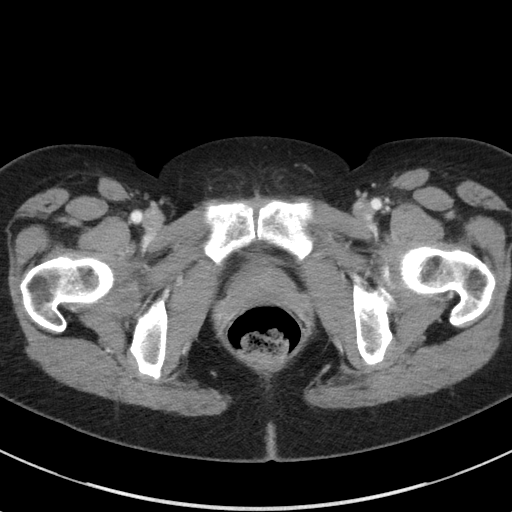
[im 20/92  soft-tissue]
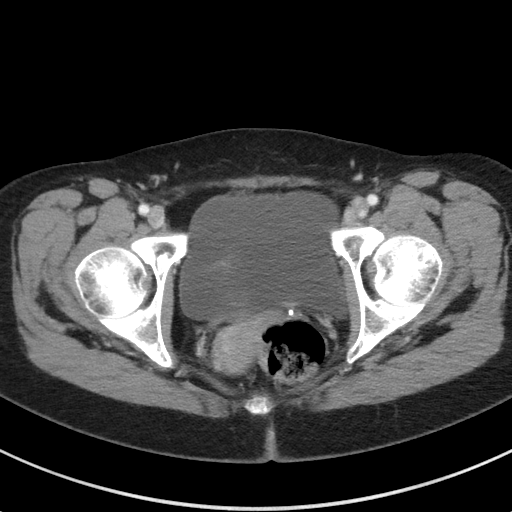
[im 24/92  soft-tissue]
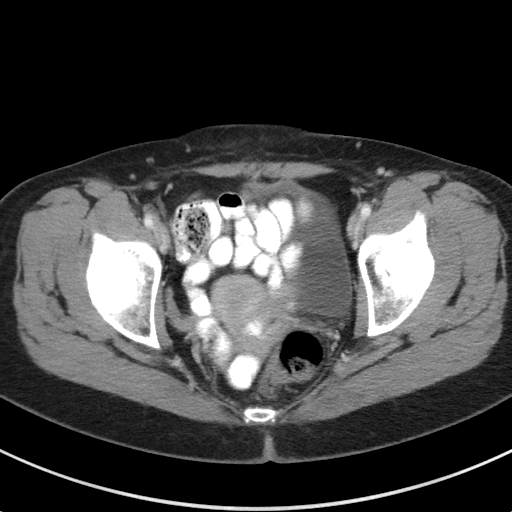
[im 34/92  soft-tissue]
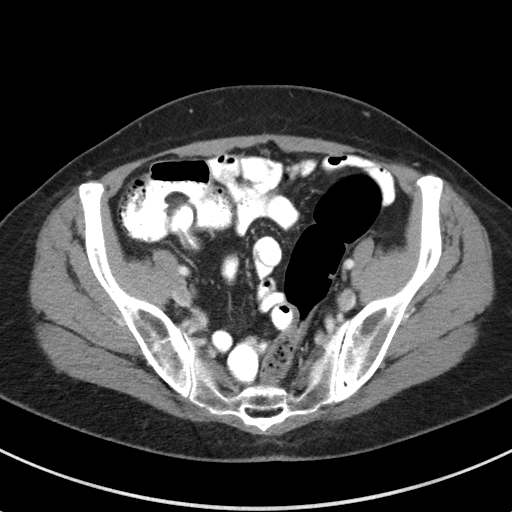
[im 39/92  soft-tissue]
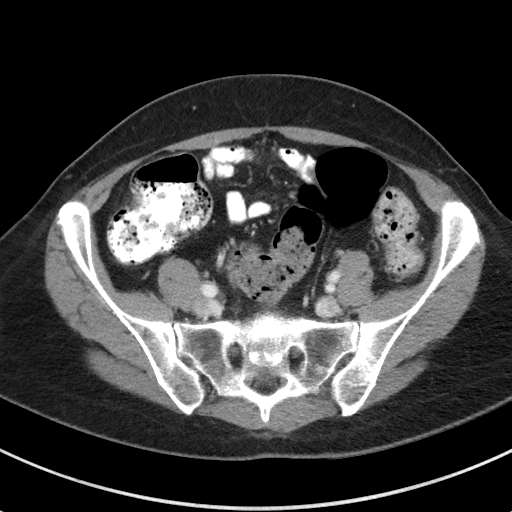
[im 48/92  soft-tissue]
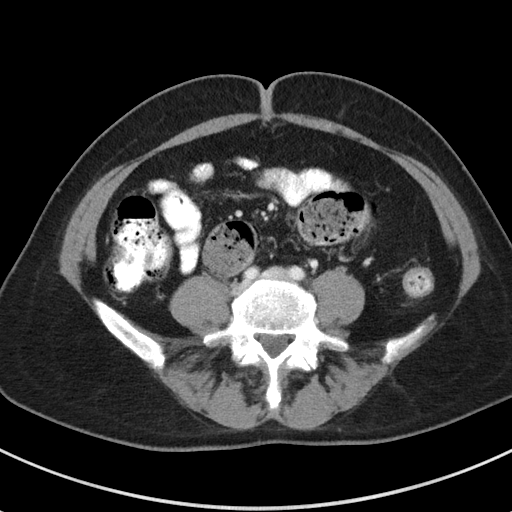
[im 53/92  soft-tissue]
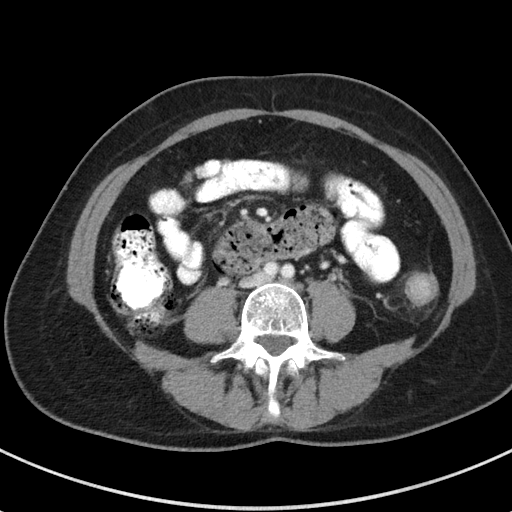
[im 58/92  soft-tissue]
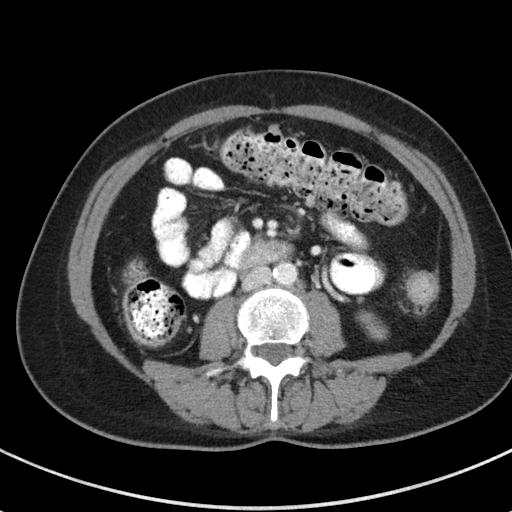
[im 58/92  bone]
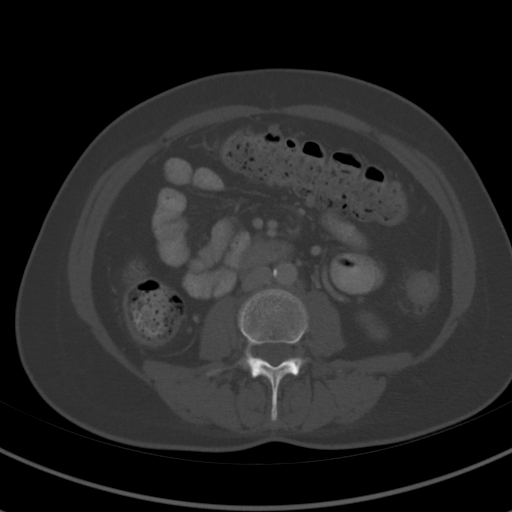
[im 68/92  soft-tissue]
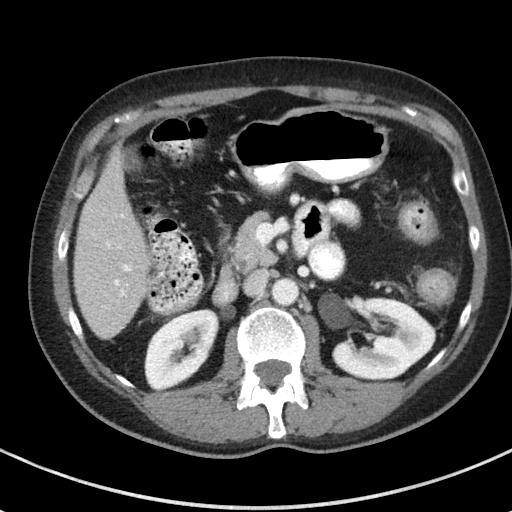
[im 72/92  soft-tissue]
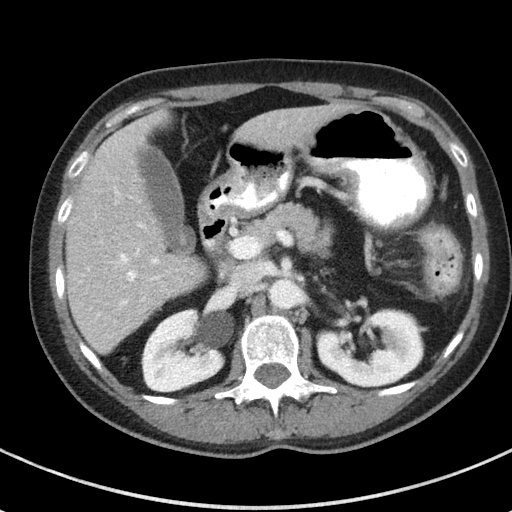
[im 72/92  lung]
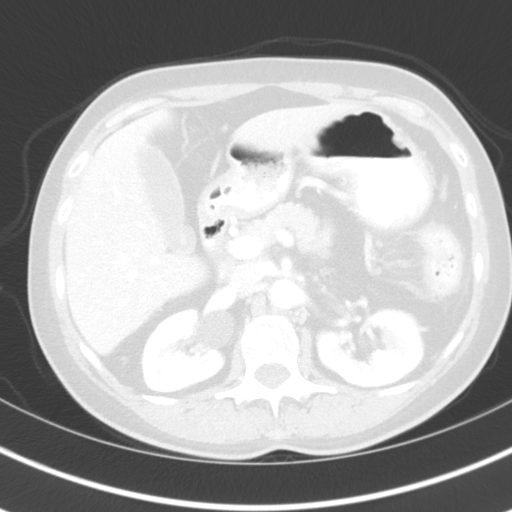
[im 77/92  soft-tissue]
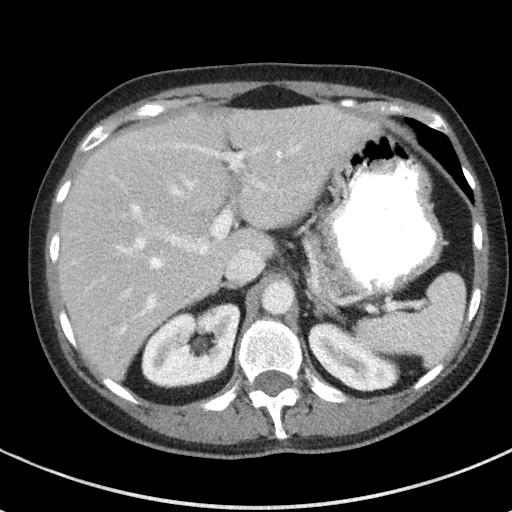
[im 77/92  lung]
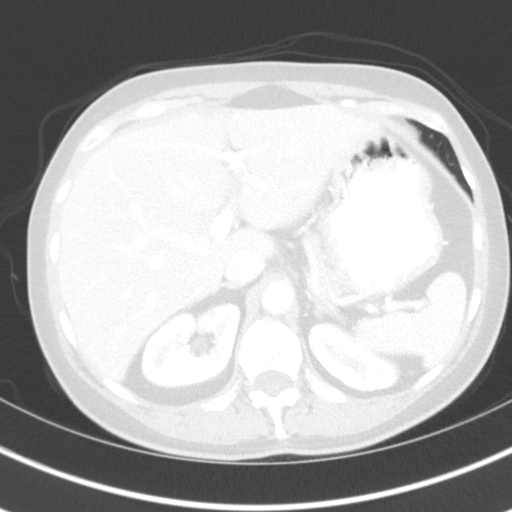
[im 82/92  lung]
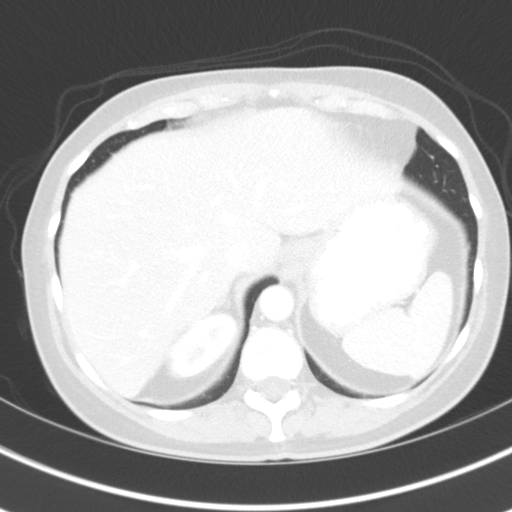
[im 87/92  soft-tissue]
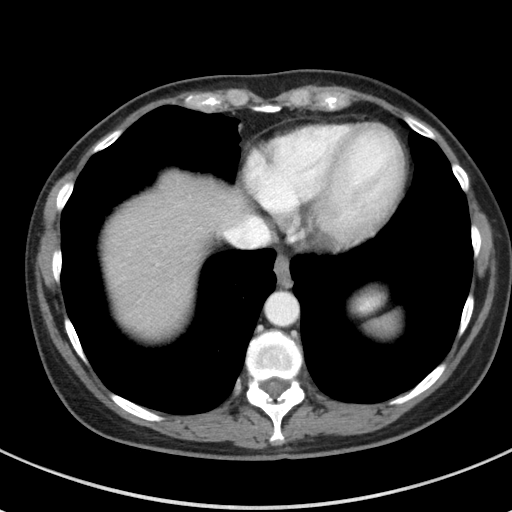
[im 87/92  lung]
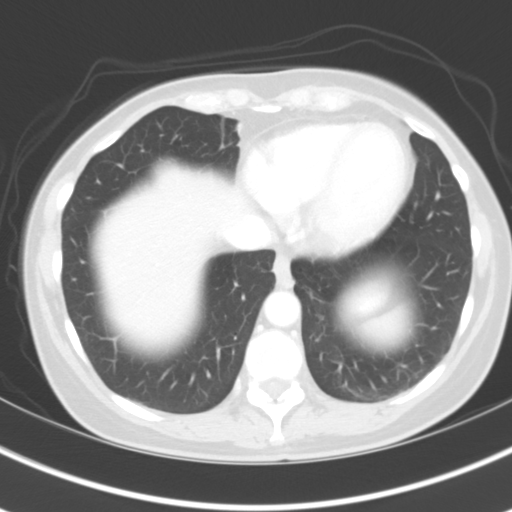

[14 of 32 positions shown; findings below may reference images not displayed]

FINDINGS: Lower chest: Clear lung bases. Normal heart size without pericardial
or pleural effusion.

Hepatobiliary: Suspicion of mild hepatic steatosis. No focal liver
lesion. Normal gallbladder, without biliary ductal dilatation.

Pancreas: Normal, without mass or ductal dilatation.

Spleen: Normal in size, without focal abnormality.

Adrenals/Urinary Tract: Normal adrenal glands. 5 mm stone in the
upper pole right kidney. Normal left kidney, without hydronephrosis.
Normal urinary bladder.

Stomach/Bowel: Normal stomach, without wall thickening. Splenic
flexure and proximal descending colonic wall thickening and
surrounding edema, including on images 22 through 41 of series 2.
Large colonic stool burden more proximally. Normal terminal ileum
and appendix. Normal small bowel.

Vascular/Lymphatic: Aortic atherosclerosis. Small retroperitoneal
nodes are likely reactive and not pathologic by size criteria. No
pelvic sidewall adenopathy.

Reproductive: Normal uterus and adnexa.

Other: No significant free fluid.  No free intraperitoneal air.

Musculoskeletal: No acute osseous abnormality. Disc bulge at the
lumbosacral junction with loss of intervertebral disc height.
IMPRESSION: 1. Left-sided colitis. Given the clinical history, likely related to
Crohn disease. no complication.
2.  Possible constipation.
3. Right nephrolithiasis.
4. Suspicion of mild hepatic steatosis.

## 2017-11-06 ENCOUNTER — Encounter: Payer: Self-pay | Admitting: Internal Medicine

## 2017-11-06 ENCOUNTER — Ambulatory Visit (INDEPENDENT_AMBULATORY_CARE_PROVIDER_SITE_OTHER): Payer: 59 | Admitting: Internal Medicine

## 2017-11-06 VITALS — BP 130/74 | HR 89 | Temp 97.8°F | Resp 18 | Ht 65.0 in | Wt 154.4 lb

## 2017-11-06 DIAGNOSIS — M810 Age-related osteoporosis without current pathological fracture: Secondary | ICD-10-CM

## 2017-11-06 DIAGNOSIS — K50919 Crohn's disease, unspecified, with unspecified complications: Secondary | ICD-10-CM

## 2017-11-06 DIAGNOSIS — F419 Anxiety disorder, unspecified: Secondary | ICD-10-CM

## 2017-11-06 DIAGNOSIS — Z23 Encounter for immunization: Secondary | ICD-10-CM | POA: Diagnosis not present

## 2017-11-06 DIAGNOSIS — Z0000 Encounter for general adult medical examination without abnormal findings: Secondary | ICD-10-CM

## 2017-11-06 DIAGNOSIS — K219 Gastro-esophageal reflux disease without esophagitis: Secondary | ICD-10-CM | POA: Diagnosis not present

## 2017-11-06 DIAGNOSIS — Z13220 Encounter for screening for lipoid disorders: Secondary | ICD-10-CM

## 2017-11-06 DIAGNOSIS — G479 Sleep disorder, unspecified: Secondary | ICD-10-CM

## 2017-11-06 MED ORDER — BUPROPION HCL ER (XL) 150 MG PO TB24: 150.0000 mg | ORAL_TABLET | Freq: Every day | ORAL | 4 refills | Status: DC

## 2017-11-06 NOTE — Progress Notes (Signed)
Patient ID: BLONDIE RIGGSBEE, female   DOB: 1955/06/18, 62 y.o.   MRN: [id number]   Subjective:    Patient ID: [name], female    DOB: [birth year], 62 y.o.   MRN: [id number]  HPI  Patient here for her physical exam. She reports increased stress.  Her husband had a liver transplant.  She is getting some help from her family.  Stays active.  Working.  No chest pain.  No sob.  No acid reflux.  No abdominal pain.  Her bowels are stable.  Overall she feels she is handling things relatively well.  Does not feel needs any further intervention.  Have previously discussed bone density results.  She had decided on reclast.  Was going to cost her an increase amount of money.  Wants to hold at this time. Discussed other treatment options.     Past Medical History:  Diagnosis Date  . Chlamydia   . Crohn's disease ([place])    large intestine  . GERD (gastroesophageal reflux disease)    Past Surgical History:  Procedure Laterality Date  . laparoscopy and tuba repair    . orif right calcaneus    . TONSILECTOMY/ADENOIDECTOMY WITH MYRINGOTOMY     Family History  Problem Relation Age of Onset  . Lymphoma Father   . Asthma Mother   . Osteoporosis Mother   . Mental illness Brother        suicide  . Pancreatic cancer Unknown        uncle  . COPD Maternal Grandmother   . Breast cancer Neg Hx   . Colon cancer Neg Hx    Social History   Socioeconomic History  . Marital status: Married    Spouse name: Not on file  . Number of children: 0  . Years of education: Not on file  . Highest education level: Not on file  Occupational History  . Not on file  Social Needs  . Financial resource strain: Not on file  . Food insecurity:    Worry: Not on file    Inability: Not on file  . Transportation needs:    Medical: Not on file    Non-medical: Not on file  Tobacco Use  . Smoking status: Former Smoker    Last attempt to quit: 03/11/1999    Years since quitting: 18.6  . Smokeless tobacco: Never  Used  Substance and Sexual Activity  . Alcohol use: No    Alcohol/week: 0.0 standard drinks  . Drug use: No  . Sexual activity: Not on file  Lifestyle  . Physical activity:    Days per week: Not on file    Minutes per session: Not on file  . Stress: Not on file  Relationships  . Social connections:    Talks on phone: Not on file    Gets together: Not on file    Attends religious service: Not on file    Active member of club or organization: Not on file    Attends meetings of clubs or organizations: Not on file    Relationship status: Not on file  Other Topics Concern  . Not on file  Social History Narrative  . Not on file    Outpatient Encounter Medications as of 11/06/2017  Medication Sig  . budesonide (ENTOCORT EC) 3 MG 24 hr capsule Take 9 mg by mouth daily.   Marland Kitchen buPROPion (WELLBUTRIN XL) 150 MG 24 hr tablet Take 1 tablet (150 mg total) by mouth daily.  . cholecalciferol (  VITAMIN D) 400 UNITS TABS tablet Take 400 Units by mouth 3 (three) times a week.  . fluticasone (FLONASE) 50 MCG/ACT nasal spray SPRAY 2 SPRAYS INTO EACH NOSTRIL ONCE DAILY  . LIALDA 1.2 g EC tablet   . pantoprazole (PROTONIX) 40 MG tablet Take 1 tablet (40 mg total) by mouth daily.  Marland Kitchen zolpidem (AMBIEN) 5 MG tablet Take 0.5-1 tablets (2.5-5 mg total) by mouth at bedtime as needed.  . [DISCONTINUED] buPROPion (WELLBUTRIN XL) 150 MG 24 hr tablet TAKE 1 TABLET BY MOUTH ONCE DAILY.  . [DISCONTINUED] predniSONE (DELTASONE) 5 MG tablet Take 6 pills (46m) x 1 day and then decreased by one tablet per day until down to zero mg.   No facility-administered encounter medications on file as of 11/06/2017.     Review of Systems  Constitutional: Negative for appetite change and unexpected weight change.  HENT: Negative for congestion and sinus pressure.   Eyes: Negative for pain and visual disturbance.  Respiratory: Negative for cough, chest tightness and shortness of breath.   Cardiovascular: Negative for chest pain,  palpitations and leg swelling.  Gastrointestinal: Negative for abdominal pain, diarrhea, nausea and vomiting.  Genitourinary: Negative for difficulty urinating and dysuria.  Musculoskeletal: Negative for joint swelling and myalgias.  Skin: Negative for color change and rash.  Neurological: Negative for dizziness, light-headedness and headaches.  Hematological: Negative for adenopathy. Does not bruise/bleed easily.  Psychiatric/Behavioral: Negative for agitation and dysphoric mood.       Increased stress as outlined.         Objective:     Blood pressure rechecked by me:  118/78  Physical Exam  Constitutional: She is oriented to person, place, and time. She appears well-developed and well-nourished. No distress.  HENT:  Nose: Nose normal.  Mouth/Throat: Oropharynx is clear and moist.  Eyes: Right eye exhibits no discharge. Left eye exhibits no discharge. No scleral icterus.  Neck: Neck supple. No thyromegaly present.  Cardiovascular: Normal rate and regular rhythm.  Pulmonary/Chest: Breath sounds normal. No accessory muscle usage. No tachypnea. No respiratory distress. She has no decreased breath sounds. She has no wheezes. She has no rhonchi. Right breast exhibits no inverted nipple, no mass, no nipple discharge and no tenderness (no axillary adenopathy). Left breast exhibits no inverted nipple, no mass, no nipple discharge and no tenderness (no axilarry adenopathy).  Abdominal: Soft. Bowel sounds are normal. There is no tenderness.  Musculoskeletal: She exhibits no edema or tenderness.  Lymphadenopathy:    She has no cervical adenopathy.  Neurological: She is alert and oriented to person, place, and time.  Skin: No rash noted. No erythema.  Psychiatric: She has a normal mood and affect. Her behavior is normal.    BP 130/74 (BP Location: Left Arm, Patient Position: Sitting, Cuff Size: Normal)   Pulse 89   Temp 97.8 F (36.6 C) (Oral)   Resp 18   Ht 5' 5"  (1.651 m)   Wt 154 lb  6.4 oz (70 kg)   LMP 04/12/2010   SpO2 98%   BMI 25.69 kg/m  Wt Readings from Last 3 Encounters:  11/06/17 154 lb 6.4 oz (70 kg)  07/07/17 151 lb (68.5 kg)  03/30/17 153 lb 9.6 oz (69.7 kg)     Lab Results  Component Value Date   WBC 9.8 09/26/2016   HGB 13.7 09/26/2016   HCT 41.8 09/26/2016   PLT 304.0 09/26/2016   GLUCOSE 94 07/07/2017   CHOL 190 09/26/2016   TRIG 92.0 09/26/2016  HDL 75.40 09/26/2016   LDLCALC 96 09/26/2016   ALT 15 07/07/2017   AST 18 07/07/2017   NA 140 07/07/2017   K 4.4 07/07/2017   CL 101 07/07/2017   CREATININE 0.77 07/07/2017   BUN 14 07/07/2017   CO2 34 (H) 07/07/2017   TSH 0.75 07/07/2017    Dg Bone Density  Result Date: 06/15/2017 EXAM: DUAL X-RAY ABSORPTIOMETRY (DXA) FOR BONE MINERAL DENSITY IMPRESSION: Dear Dr. Nicki Reaper, Your patient Erika Slaby completed a BMD test on 06/15/2017 using the [place] (analysis version: 14.10) manufactured by [employer]. The following summarizes the results of our evaluation. PATIENT BIOGRAPHICAL: Name: Norinne, Jeane Patient ID: [id number] Birth Date: 1956/02/15 Height: 65.0 in. Gender: Female Exam Date: 06/15/2017 Weight: 151.3 lbs. Indications: Caucasian, Postmenopausal Fractures: Right ankle Treatments: Vitamin D ASSESSMENT: The BMD measured at Femur Neck Right is 0.676 g/cm2 with a T-score of -2.6. This patient is considered OSTEOPOROTIC according to [facility]) criteria. L-3 was excluded due to degenerative changes. Site Region Measured Measured WHO Young Adult BMD Date       Age      Classification T-score AP Spine L1-L4 (L3) 06/15/2017 60.9 Osteopenia -1.5 0.995 g/cm2 DualFemur Neck Right 06/15/2017 60.9 Osteoporosis -2.6 0.676 g/cm2 World Health Organization [facility]) criteria for post-menopausal, Caucasian Women: Normal:       T-score at or above -1 SD Osteopenia:   T-score between -1 and -2.5 SD Osteoporosis: T-score at or below -2.5 SD RECOMMENDATIONS: 1. All patients  should optimize calcium and vitamin D intake. 2. Consider FDA-approved medical therapies in postmenopausal women and men aged 48 years and older, based on the following: a. A hip or vertebral(clinical or morphometric) fracture b. T-score < -2.5 at the femoral neck or spine after appropriate evaluation to exclude secondary causes c. Low bone mass (T-score between -1.0 and -2.5 at the femoral neck or spine) and a 10-year probability of a hip fracture > 3% or a 10-year probability of a major osteoporosis-related fracture > 20% based on the [place]-adapted WHO algorithm d. Clinician judgment and/or patient preferences may indicate treatment for people with 10-year fracture probabilities above or below these levels FOLLOW-UP: People with diagnosed cases of osteoporosis or at high risk for fracture should have regular bone mineral density tests. For patients eligible for Medicare, routine testing is allowed once every 2 years. The testing frequency can be increased to one year for patients who have rapidly progressing disease, those who are receiving or discontinuing medical therapy to restore bone mass, or have additional risk factors. I have reviewed this report, and agree with the above findings. Mark A. Thornton Papas, M.D. [facility] Electronically Signed   By: Lavonia Dana M.D.   On: 06/15/2017 14:49   Mm Digital Screening Bilateral  Result Date: 06/16/2017 CLINICAL DATA:  Screening. EXAM: DIGITAL SCREENING BILATERAL MAMMOGRAM WITH CAD COMPARISON:  Previous exam(s). ACR Breast Density Category c: The breast tissue is heterogeneously dense, which may obscure small masses. FINDINGS: There are no findings suspicious for malignancy. Images were processed with CAD. IMPRESSION: No mammographic evidence of malignancy. A result letter of this screening mammogram will be mailed directly to the patient. RECOMMENDATION: Screening mammogram in one year. (Code:SM-B-01Y) BI-RADS CATEGORY  1: Negative. Electronically Signed   By:  Nolon Nations M.D.   On: 06/16/2017 09:09       Assessment & Plan:   Problem List Items Addressed This Visit    Anxiety    Overall she feels she is  handling things relatively well.  On wellbutrin.  Stable.  Follow.       Relevant Medications   buPROPion (WELLBUTRIN XL) 150 MG 24 hr tablet   Crohn's disease ([place])    Followed by GI.  Currently doing well on current regiment.  Follow.        GERD (gastroesophageal reflux disease)    Controlled on current regimen.  Follow.        Health care maintenance    Physical today 11/06/17.  PAP 09/26/16 negative (atrophy changes noted) with negative HPV.  colonoscoy 05/2016 - internal hemorrhoids, pseudopolyps as outlined in report.  Mammogram 06/16/17 - Briads I.       Osteoporosis    Have discussed bone density results.  She had decided on reclast.  Was going to cost her a lot of money. Discussed other treatment options. Wants to hold at this time.  Follow.        Relevant Orders   CBC with Differential/Platelet   Hepatic function panel   Basic metabolic panel   Sleeping difficulty    Takes ambien prn.         Other Visit Diagnoses    Need for influenza vaccination    -  Primary   Relevant Orders   Flu Vaccine QUAD 6+ mos PF IM (Fluarix Quad PF) (Completed)   Screening cholesterol level       Relevant Orders   Lipid panel       [name], MD

## 2017-11-09 ENCOUNTER — Encounter: Payer: Self-pay | Admitting: Internal Medicine

## 2017-11-09 NOTE — Assessment & Plan Note (Signed)
Physical today 11/06/17.  PAP 09/26/16 negative (atrophy changes noted) with negative HPV.  colonoscoy 05/2016 - internal hemorrhoids, pseudopolyps as outlined in report.  Mammogram 06/16/17 - Briads I.

## 2017-11-09 NOTE — Assessment & Plan Note (Signed)
Have discussed bone density results.  She had decided on reclast.  Was going to cost her a lot of money. Discussed other treatment options. Wants to hold at this time.  Follow.

## 2017-11-09 NOTE — Assessment & Plan Note (Signed)
Controlled on current regimen.  Follow.  

## 2017-11-09 NOTE — Assessment & Plan Note (Signed)
Takes ambien prn.

## 2017-11-09 NOTE — Assessment & Plan Note (Signed)
Followed by GI.  Currently doing well on current regiment.  Follow.

## 2017-11-09 NOTE — Assessment & Plan Note (Signed)
Overall she feels she is handling things relatively well.  On wellbutrin.  Stable.  Follow.

## 2017-12-24 ENCOUNTER — Telehealth: Payer: Self-pay | Admitting: Internal Medicine

## 2017-12-24 NOTE — Telephone Encounter (Unsigned)
Copied from [place] [id number]. Topic: Quick Communication - See Telephone Encounter >> Dec 24, 2017 12:26 PM Judyann Munson wrote: Medication: Valacyclovir 1 mg   Preferred Pharmacy (with phone number or street name): [place], [place] [phone] (Phone) [phone] (Fax)  The patient stated she is having a hard time and she recently lost her husband and she feels a outbreak coming and she would like these medication be called in for her shingles.  Please advise   CRM for notification. See Telephone encounter for: 12/24/17.

## 2017-12-24 NOTE — Telephone Encounter (Signed)
LMTCB

## 2017-12-25 MED ORDER — ZOLPIDEM TARTRATE 5 MG PO TABS: 2.5000 mg | ORAL_TABLET | Freq: Every evening | ORAL | 0 refills | Status: DC | PRN

## 2017-12-25 MED ORDER — VALACYCLOVIR HCL 1 G PO TABS: ORAL_TABLET | ORAL | 0 refills | Status: DC

## 2017-12-25 NOTE — Telephone Encounter (Signed)
Pt says she is having flare ups of her shingles. Patient is requesting Valtrex 1 g to use PRN. This is what her husband was taking so she has been using as needed.   Patient also is requesting Ambien 5 mg qhs prn. She has some left and hasn't had to use them much until recently with everything going on and losing her husband.

## 2017-12-25 NOTE — Telephone Encounter (Signed)
ok'd valtrex #30 with no refills and ambien #30 with no refills.

## 2017-12-25 NOTE — Telephone Encounter (Signed)
Pt aware. Ambien faxed

## 2017-12-25 NOTE — Telephone Encounter (Signed)
Patient called called back for [place]

## 2018-03-15 ENCOUNTER — Ambulatory Visit: Payer: 59 | Admitting: Internal Medicine

## 2018-03-15 VITALS — BP 124/70 | HR 83 | Temp 97.8°F | Resp 16 | Wt 155.6 lb

## 2018-03-15 DIAGNOSIS — F419 Anxiety disorder, unspecified: Secondary | ICD-10-CM

## 2018-03-15 DIAGNOSIS — E559 Vitamin D deficiency, unspecified: Secondary | ICD-10-CM | POA: Diagnosis not present

## 2018-03-15 DIAGNOSIS — K219 Gastro-esophageal reflux disease without esophagitis: Secondary | ICD-10-CM

## 2018-03-15 DIAGNOSIS — K50919 Crohn's disease, unspecified, with unspecified complications: Secondary | ICD-10-CM

## 2018-03-15 DIAGNOSIS — G479 Sleep disorder, unspecified: Secondary | ICD-10-CM

## 2018-03-15 DIAGNOSIS — M810 Age-related osteoporosis without current pathological fracture: Secondary | ICD-10-CM

## 2018-03-15 DIAGNOSIS — Z13220 Encounter for screening for lipoid disorders: Secondary | ICD-10-CM

## 2018-03-15 MED ORDER — ZOLPIDEM TARTRATE 5 MG PO TABS: ORAL_TABLET | ORAL | 0 refills | Status: DC

## 2018-03-15 NOTE — Progress Notes (Addendum)
Patient ID: Lauren Chang, female   DOB: [birth year], 63 y.o.   MRN: [id number]   Subjective:    Patient ID: Lauren Chang, female    DOB: 15-Mar-1955, 63 y.o.   MRN: [id number]  HPI  Patient here for a scheduled follow up.  Trying to cope with her husband's death.  Has good support.  Overall she feels she is doing relatively well.  Still trying to deal with a lot of paperwork, etc.   Does not feel she needs anything more at this time.  Trying to stay active.  No chest pain.  No sob.  No acid reflux. No abdominal pain.  Bowels moving.  No urine change.     Past Medical History:  Diagnosis Date  . Chlamydia   . Crohn's disease ([place])    large intestine  . GERD (gastroesophageal reflux disease)    Past Surgical History:  Procedure Laterality Date  . laparoscopy and tuba repair    . orif right calcaneus    . TONSILECTOMY/ADENOIDECTOMY WITH MYRINGOTOMY     Family History  Problem Relation Age of Onset  . Lymphoma Father   . Asthma Mother   . Osteoporosis Mother   . Mental illness Brother        suicide  . Pancreatic cancer Other        uncle  . COPD Maternal Grandmother   . Breast cancer Neg Hx   . Colon cancer Neg Hx    Social History   Socioeconomic History  . Marital status: Married    Spouse name: Not on file  . Number of children: 0  . Years of education: Not on file  . Highest education level: Not on file  Occupational History  . Not on file  Social Needs  . Financial resource strain: Not on file  . Food insecurity:    Worry: Not on file    Inability: Not on file  . Transportation needs:    Medical: Not on file    Non-medical: Not on file  Tobacco Use  . Smoking status: Former Smoker    Last attempt to quit: 03/11/1999    Years since quitting: 19.0  . Smokeless tobacco: Never Used  Substance and Sexual Activity  . Alcohol use: No    Alcohol/week: 0.0 standard drinks  . Drug use: No  . Sexual activity: Not on file  Lifestyle  . Physical activity:   Days per week: Not on file    Minutes per session: Not on file  . Stress: Not on file  Relationships  . Social connections:    Talks on phone: Not on file    Gets together: Not on file    Attends religious service: Not on file    Active member of club or organization: Not on file    Attends meetings of clubs or organizations: Not on file    Relationship status: Not on file  Other Topics Concern  . Not on file  Social History Narrative  . Not on file    Outpatient Encounter Medications as of 03/15/2018  Medication Sig  . budesonide (ENTOCORT EC) 3 MG 24 hr capsule Take 9 mg by mouth daily.   Marland Kitchen buPROPion (WELLBUTRIN XL) 150 MG 24 hr tablet Take 1 tablet (150 mg total) by mouth daily.  . cholecalciferol (VITAMIN D) 400 UNITS TABS tablet Take 400 Units by mouth 3 (three) times a week.  . fluticasone (FLONASE) 50 MCG/ACT nasal spray SPRAY 2 SPRAYS INTO [facility]  NOSTRIL ONCE DAILY  . LIALDA 1.2 g EC tablet   . pantoprazole (PROTONIX) 40 MG tablet Take 1 tablet (40 mg total) by mouth daily.  . valACYclovir (VALTREX) 1000 MG tablet Use as directed.  . zolpidem (AMBIEN) 5 MG tablet Take 1/2 tablet q hs prn  . [DISCONTINUED] zolpidem (AMBIEN) 5 MG tablet Take 0.5-1 tablets (2.5-5 mg total) by mouth at bedtime as needed.   No facility-administered encounter medications on file as of 03/15/2018.     Review of Systems  Constitutional: Negative for appetite change and unexpected weight change.  HENT: Negative for congestion and sinus pressure.   Respiratory: Negative for cough, chest tightness and shortness of breath.   Cardiovascular: Negative for chest pain, palpitations and leg swelling.  Gastrointestinal: Negative for abdominal pain, diarrhea, nausea and vomiting.  Genitourinary: Negative for difficulty urinating and dysuria.  Musculoskeletal: Negative for joint swelling and myalgias.  Skin: Negative for color change and rash.  Neurological: Negative for dizziness, light-headedness and  headaches.  Psychiatric/Behavioral: Negative for agitation and dysphoric mood.       Objective:     Blood pressure rechecked by me:  122/78  Physical Exam Constitutional:      General: She is not in acute distress.    Appearance: Normal appearance.  HENT:     Nose: Nose normal. No congestion.     Mouth/Throat:     Pharynx: No oropharyngeal exudate or posterior oropharyngeal erythema.  Neck:     Musculoskeletal: Neck supple. No muscular tenderness.     Thyroid: No thyromegaly.  Cardiovascular:     Rate and Rhythm: Normal rate and regular rhythm.  Pulmonary:     Effort: No respiratory distress.     Breath sounds: Normal breath sounds. No wheezing.  Abdominal:     General: Bowel sounds are normal.     Palpations: Abdomen is soft.     Tenderness: There is no abdominal tenderness.  Musculoskeletal:        General: No swelling or tenderness.  Lymphadenopathy:     Cervical: No cervical adenopathy.  Skin:    Findings: No erythema or rash.  Neurological:     Mental Status: She is alert.  Psychiatric:        Mood and Affect: Mood normal.        Behavior: Behavior normal.     BP 124/70 (BP Location: Right Arm, Patient Position: Sitting, Cuff Size: Normal)   Pulse 83   Temp 97.8 F (36.6 C) (Oral)   Resp 16   Wt 155 lb 9.6 oz (70.6 kg)   LMP 04/12/2010   SpO2 98%   BMI 25.89 kg/m  Wt Readings from Last 3 Encounters:  03/15/18 155 lb 9.6 oz (70.6 kg)  11/06/17 154 lb 6.4 oz (70 kg)  07/07/17 151 lb (68.5 kg)     Lab Results  Component Value Date   WBC 9.8 09/26/2016   HGB 13.7 09/26/2016   HCT 41.8 09/26/2016   PLT 304.0 09/26/2016   GLUCOSE 94 07/07/2017   CHOL 190 09/26/2016   TRIG 92.0 09/26/2016   HDL 75.40 09/26/2016   LDLCALC 96 09/26/2016   ALT 15 07/07/2017   AST 18 07/07/2017   NA 140 07/07/2017   K 4.4 07/07/2017   CL 101 07/07/2017   CREATININE 0.77 07/07/2017   BUN 14 07/07/2017   CO2 34 (H) 07/07/2017   TSH 0.75 07/07/2017    Dg Bone  Density  Result Date: 06/15/2017 EXAM: DUAL X-RAY ABSORPTIOMETRY (DXA)  FOR BONE MINERAL DENSITY IMPRESSION: Dear Dr. Nicki Reaper, Your patient Lauren Chang completed a BMD test on 06/15/2017 using the [place] (analysis version: 14.10) manufactured by [employer]. The following summarizes the results of our evaluation. PATIENT BIOGRAPHICAL: Name: Azlin, Zilberman Patient ID: [id number] Birth Date: 1955-11-20 Height: 65.0 in. Gender: Female Exam Date: 06/15/2017 Weight: 151.3 lbs. Indications: Caucasian, Postmenopausal Fractures: Right ankle Treatments: Vitamin D ASSESSMENT: The BMD measured at Femur Neck Right is 0.676 g/cm2 with a T-score of -2.6. This patient is considered OSTEOPOROTIC according to [facility]) criteria. L-3 was excluded due to degenerative changes. Site Region Measured Measured WHO Young Adult BMD Date       Age      Classification T-score AP Spine L1-L4 (L3) 06/15/2017 60.9 Osteopenia -1.5 0.995 g/cm2 DualFemur Neck Right 06/15/2017 60.9 Osteoporosis -2.6 0.676 g/cm2 World Health Organization [facility]) criteria for post-menopausal, Caucasian Women: Normal:       T-score at or above -1 SD Osteopenia:   T-score between -1 and -2.5 SD Osteoporosis: T-score at or below -2.5 SD RECOMMENDATIONS: 1. All patients should optimize calcium and vitamin D intake. 2. Consider FDA-approved medical therapies in postmenopausal women and men aged 29 years and older, based on the following: a. A hip or vertebral(clinical or morphometric) fracture b. T-score < -2.5 at the femoral neck or spine after appropriate evaluation to exclude secondary causes c. Low bone mass (T-score between -1.0 and -2.5 at the femoral neck or spine) and a 10-year probability of a hip fracture > 3% or a 10-year probability of a major osteoporosis-related fracture > 20% based on the [place]-adapted WHO algorithm d. Clinician judgment and/or patient preferences may indicate treatment for people with 10-year fracture  probabilities above or below these levels FOLLOW-UP: People with diagnosed cases of osteoporosis or at high risk for fracture should have regular bone mineral density tests. For patients eligible for Medicare, routine testing is allowed once every 2 years. The testing frequency can be increased to one year for patients who have rapidly progressing disease, those who are receiving or discontinuing medical therapy to restore bone mass, or have additional risk factors. I have reviewed this report, and agree with the above findings. Mark A. Thornton Papas, M.D. [facility]iology Electronically Signed   By: Lavonia Dana M.D.   On: 06/15/2017 14:49   Mm Digital Screening Bilateral  Result Date: 06/16/2017 CLINICAL DATA:  Screening. EXAM: DIGITAL SCREENING BILATERAL MAMMOGRAM WITH CAD COMPARISON:  Previous exam(s). ACR Breast Density Category c: The breast tissue is heterogeneously dense, which may obscure small masses. FINDINGS: There are no findings suspicious for malignancy. Images were processed with CAD. IMPRESSION: No mammographic evidence of malignancy. A result letter of this screening mammogram will be mailed directly to the patient. RECOMMENDATION: Screening mammogram in one year. (Code:SM-B-01Y) BI-RADS CATEGORY  1: Negative. Electronically Signed   By: Nolon Nations M.D.   On: 06/16/2017 09:09       Assessment & Plan:   Problem List Items Addressed This Visit    Anxiety    Discussed with her today.  She feels she is doing relatively well.  Does not feel needs anything more at this time.  Follow.        Crohn's disease ([place])    Followed by GI.  Currently doing well on current regimen.  Follow.       Relevant Orders   CBC with Differential/Platelet   Hepatic function panel   TSH   Basic metabolic panel  GERD (gastroesophageal reflux disease)    Controlled on current regimen.  Follow.       Osteoporosis    Wanted to hold on further treatment.  See previous note.  Follow.        Sleeping  difficulty    Takes ambien.  Has been taking more regular since her husband's death.  Discussed decreasing in future.  Follow.         Other Visit Diagnoses    Vitamin D deficiency    -  Primary   Relevant Orders   VITAMIN D 25 Hydroxy (Vit-D Deficiency, Fractures)   Screening cholesterol level       Relevant Orders   Lipid panel       [name], MD

## 2018-03-15 NOTE — Patient Instructions (Signed)
Have your labs drawn at [facility] in the next 4-6 weeks.

## 2018-03-20 ENCOUNTER — Other Ambulatory Visit: Payer: Self-pay | Admitting: Internal Medicine

## 2018-03-20 ENCOUNTER — Encounter: Payer: Self-pay | Admitting: Internal Medicine

## 2018-03-20 NOTE — Assessment & Plan Note (Signed)
Wanted to hold on further treatment.  See previous note.  Follow.

## 2018-03-20 NOTE — Assessment & Plan Note (Signed)
Discussed with her today.  She feels she is doing relatively well.  Does not feel needs anything more at this time.  Follow.

## 2018-03-20 NOTE — Assessment & Plan Note (Signed)
Controlled on current regimen.  Follow.  

## 2018-03-20 NOTE — Addendum Note (Signed)
Addended by: Alisa Graff on: 03/20/2018 01:57 PM   Modules accepted: Orders

## 2018-03-20 NOTE — Assessment & Plan Note (Signed)
Takes ambien.  Has been taking more regular since her husband's death.  Discussed decreasing in future.  Follow.

## 2018-03-20 NOTE — Assessment & Plan Note (Signed)
Followed by GI.  Currently doing well on current regimen.  Follow.

## 2018-04-18 ENCOUNTER — Other Ambulatory Visit: Payer: Self-pay | Admitting: Internal Medicine

## 2018-04-19 NOTE — Telephone Encounter (Signed)
Refilled: 03/15/2018 Last OV: 03/15/2018 Next OV: 07/16/2018

## 2018-04-20 NOTE — Telephone Encounter (Signed)
rx ok'd for ambien #30 with no refills.

## 2018-05-18 ENCOUNTER — Other Ambulatory Visit: Payer: Self-pay | Admitting: Internal Medicine

## 2018-05-18 NOTE — Telephone Encounter (Signed)
Pt states that she has 1 week left.

## 2018-05-18 NOTE — Telephone Encounter (Signed)
Pt has enough to get through till you return to the office.

## 2018-05-18 NOTE — Telephone Encounter (Signed)
LMTCB. Need to find out if the pt is out of medication or if she has enough to get through till Dr. Nicki Reaper returns on Thursday. PEC may speak with pt.

## 2018-05-19 NOTE — Telephone Encounter (Signed)
rx ok'd for ambien #15 with one refill.

## 2018-06-17 ENCOUNTER — Other Ambulatory Visit: Payer: Self-pay | Admitting: Internal Medicine

## 2018-06-17 NOTE — Telephone Encounter (Signed)
Last OV 03/15/2018  Last refilled   Next OV   Last refilled

## 2018-07-16 ENCOUNTER — Ambulatory Visit (INDEPENDENT_AMBULATORY_CARE_PROVIDER_SITE_OTHER): Payer: 59 | Admitting: Internal Medicine

## 2018-07-16 ENCOUNTER — Other Ambulatory Visit: Payer: Self-pay

## 2018-07-16 ENCOUNTER — Encounter: Payer: Self-pay | Admitting: Internal Medicine

## 2018-07-16 DIAGNOSIS — K219 Gastro-esophageal reflux disease without esophagitis: Secondary | ICD-10-CM | POA: Diagnosis not present

## 2018-07-16 DIAGNOSIS — K50919 Crohn's disease, unspecified, with unspecified complications: Secondary | ICD-10-CM

## 2018-07-16 DIAGNOSIS — G479 Sleep disorder, unspecified: Secondary | ICD-10-CM | POA: Diagnosis not present

## 2018-07-16 DIAGNOSIS — F419 Anxiety disorder, unspecified: Secondary | ICD-10-CM

## 2018-07-16 DIAGNOSIS — M25552 Pain in left hip: Secondary | ICD-10-CM

## 2018-07-16 NOTE — Progress Notes (Signed)
Patient ID: Lauren Chang, female   DOB: 1955-07-28, 63 y.o.   MRN: [id number]   Virtual Visit via Video Note  This visit type was conducted due to national recommendations for restrictions regarding the COVID-19 pandemic (e.g. social distancing).  This format is felt to be most appropriate for this patient at this time.  All issues noted in this document were discussed and addressed.  No physical exam was performed (except for noted visual exam findings with Video Visits).   I connected with Lauren Chang by a video enabled telemedicine application and verified that I am speaking with the correct person using two identifiers. Location patient: work Location provider: work Persons participating in the virtual visit: patient, provider  I discussed the limitations, risks, security and privacy concerns of performing an evaluation and management service by video and the availability of in person appointments.  The patient expressed understanding and agreed to proceed.   Reason for visit: scheduled follow up.   HPI: She reports she is doing relatively well.  Increased stress with work and in coping with her husband's death.  Discussed with her today.  Overall she feels she is doing relatively well.  Does not feel needs anything more.  Trying to stay active.  No chest pain.  No sob.  No acid reflux.  No abdominal pain. Bowels moving.  Has noticed some left hip pain.  Feels is related to sitting more.  When up moving, better.  Desires no further intervention.     ROS: See pertinent positives and negatives per HPI.  Past Medical History:  Diagnosis Date  . Chlamydia   . Crohn's disease ([place])    large intestine  . GERD (gastroesophageal reflux disease)     Past Surgical History:  Procedure Laterality Date  . laparoscopy and tuba repair    . orif right calcaneus    . TONSILECTOMY/ADENOIDECTOMY WITH MYRINGOTOMY      Family History  Problem Relation Age of Onset  . Lymphoma Father    . Asthma Mother   . Osteoporosis Mother   . Mental illness Brother        suicide  . Pancreatic cancer Other        uncle  . COPD Maternal Grandmother   . Breast cancer Neg Hx   . Colon cancer Neg Hx     SOCIAL HX: reviewed.    Current Outpatient Medications:  .  budesonide (ENTOCORT EC) 3 MG 24 hr capsule, Take 9 mg by mouth daily. , Disp: , Rfl: 3 .  buPROPion (WELLBUTRIN XL) 150 MG 24 hr tablet, TAKE 1 TABLET BY MOUTH ONCE DAILY., Disp: 30 tablet, Rfl: 10 .  cholecalciferol (VITAMIN D) 400 UNITS TABS tablet, Take 400 Units by mouth 3 (three) times a week., Disp: , Rfl:  .  fluticasone (FLONASE) 50 MCG/ACT nasal spray, SPRAY 2 SPRAYS INTO EACH NOSTRIL ONCE DAILY, Disp: 16 g, Rfl: 5 .  LIALDA 1.2 g EC tablet, , Disp: , Rfl:  .  pantoprazole (PROTONIX) 40 MG tablet, Take 1 tablet (40 mg total) by mouth daily., Disp: 30 tablet, Rfl: 3 .  valACYclovir (VALTREX) 1000 MG tablet, Use as directed., Disp: 30 tablet, Rfl: 0 .  zolpidem (AMBIEN) 5 MG tablet, TAKE 1/2 TABLET BY MOUTH AT BEDTIME AS NEEDED., Disp: 15 tablet, Rfl: 1  EXAM:  VITALS per patient if applicable: 616/07, 72, 158 pounds.    GENERAL: alert, oriented, appears well and in no acute distress  HEENT: atraumatic, conjunttiva clear, no  obvious abnormalities on inspection of external nose and ears  NECK: normal movements of the head and neck  LUNGS: on inspection no signs of respiratory distress, breathing rate appears normal, no obvious gross SOB, gasping or wheezing  CV: no obvious cyanosis  PSYCH/NEURO: pleasant and cooperative, no obvious depression or anxiety, speech and thought processing grossly intact  ASSESSMENT AND PLAN:  Discussed the following assessment and plan:  Anxiety  Crohn's disease with complication, unspecified gastrointestinal tract location (HCC)  Gastroesophageal reflux disease, esophagitis presence not specified  Sleeping difficulty  Left hip pain  Anxiety Discussed with her today.   She feels she is doing relatively well.  Does not feel needs any further intervention.  Follow.    Crohn's disease Followed by GI.  Doing well on current medication regimen.  Follow.    GERD (gastroesophageal reflux disease) Controlled on current regimen.  Follow.   Sleeping difficulty Takes ambien prn.  Follow.  Overall stable. Also taking melatonin.    Left hip pain Better with activity.  Desires no further intervention.  Follow.      I discussed the assessment and treatment plan with the patient. The patient was provided an opportunity to ask questions and all were answered. The patient agreed with the plan and demonstrated an understanding of the instructions.   The patient was advised to call back or seek an in-person evaluation if the symptoms worsen or if the condition fails to improve as anticipated.    [name], MD

## 2018-07-18 ENCOUNTER — Encounter: Payer: Self-pay | Admitting: Internal Medicine

## 2018-07-18 DIAGNOSIS — M25552 Pain in left hip: Secondary | ICD-10-CM | POA: Insufficient documentation

## 2018-07-18 NOTE — Assessment & Plan Note (Signed)
Better with activity.  Desires no further intervention.  Follow.

## 2018-07-18 NOTE — Assessment & Plan Note (Signed)
Takes ambien prn.  Follow.  Overall stable. Also taking melatonin.

## 2018-07-18 NOTE — Assessment & Plan Note (Signed)
Followed by GI.  Doing well on current medication regimen.  Follow.

## 2018-07-18 NOTE — Assessment & Plan Note (Signed)
Controlled on current regimen.  Follow.  

## 2018-07-18 NOTE — Assessment & Plan Note (Signed)
Discussed with her today.  She feels she is doing relatively well.  Does not feel needs any further intervention.  Follow.

## 2018-08-22 ENCOUNTER — Encounter: Payer: Self-pay | Admitting: Internal Medicine

## 2018-09-02 ENCOUNTER — Other Ambulatory Visit: Payer: Self-pay | Admitting: Internal Medicine

## 2018-09-03 ENCOUNTER — Encounter: Payer: Self-pay | Admitting: Internal Medicine

## 2018-09-03 NOTE — Telephone Encounter (Signed)
Okay to refill? 

## 2018-09-04 NOTE — Telephone Encounter (Signed)
rx ok'd for ambien #15 with 1 refill.

## 2018-09-08 ENCOUNTER — Other Ambulatory Visit: Payer: 59

## 2018-09-29 ENCOUNTER — Other Ambulatory Visit: Payer: Self-pay | Admitting: Internal Medicine

## 2018-09-29 DIAGNOSIS — Z1231 Encounter for screening mammogram for malignant neoplasm of breast: Secondary | ICD-10-CM

## 2018-10-20 ENCOUNTER — Other Ambulatory Visit: Payer: Self-pay | Admitting: Internal Medicine

## 2018-11-05 ENCOUNTER — Ambulatory Visit
Admission: RE | Admit: 2018-11-05 | Discharge: 2018-11-05 | Disposition: A | Discharge status: Home / Self Care | Payer: 59 | Source: Ambulatory Visit | Attending: Internal Medicine | Admitting: Internal Medicine

## 2018-11-05 DIAGNOSIS — Z1231 Encounter for screening mammogram for malignant neoplasm of breast: Secondary | ICD-10-CM

## 2018-12-28 ENCOUNTER — Other Ambulatory Visit: Payer: Self-pay

## 2018-12-30 ENCOUNTER — Other Ambulatory Visit: Payer: Self-pay

## 2018-12-30 ENCOUNTER — Ambulatory Visit (INDEPENDENT_AMBULATORY_CARE_PROVIDER_SITE_OTHER): Payer: 59 | Admitting: Internal Medicine

## 2018-12-30 ENCOUNTER — Other Ambulatory Visit (HOSPITAL_COMMUNITY)
Admission: RE | Admit: 2018-12-30 | Discharge: 2018-12-30 | Disposition: A | Discharge status: Home / Self Care | Payer: 59 | Source: Ambulatory Visit | Attending: Internal Medicine | Admitting: Internal Medicine

## 2018-12-30 ENCOUNTER — Encounter: Payer: Self-pay | Admitting: Internal Medicine

## 2018-12-30 VITALS — BP 138/78 | HR 86 | Temp 97.3°F | Resp 16 | Ht 65.0 in | Wt 160.8 lb

## 2018-12-30 DIAGNOSIS — Z124 Encounter for screening for malignant neoplasm of cervix: Secondary | ICD-10-CM

## 2018-12-30 DIAGNOSIS — Z0000 Encounter for general adult medical examination without abnormal findings: Secondary | ICD-10-CM | POA: Diagnosis not present

## 2018-12-30 DIAGNOSIS — Z23 Encounter for immunization: Secondary | ICD-10-CM

## 2018-12-30 DIAGNOSIS — K50919 Crohn's disease, unspecified, with unspecified complications: Secondary | ICD-10-CM | POA: Diagnosis not present

## 2018-12-30 DIAGNOSIS — M810 Age-related osteoporosis without current pathological fracture: Secondary | ICD-10-CM

## 2018-12-30 DIAGNOSIS — K219 Gastro-esophageal reflux disease without esophagitis: Secondary | ICD-10-CM

## 2018-12-30 DIAGNOSIS — Z13220 Encounter for screening for lipoid disorders: Secondary | ICD-10-CM | POA: Diagnosis not present

## 2018-12-30 DIAGNOSIS — F419 Anxiety disorder, unspecified: Secondary | ICD-10-CM

## 2018-12-30 LAB — BASIC METABOLIC PANEL
BUN: 17 mg/dL (ref 6–23)
CO2: 31 mEq/L (ref 19–32)
Calcium: 9.8 mg/dL (ref 8.4–10.5)
Chloride: 102 mEq/L (ref 96–112)
Creatinine, Ser: 0.87 mg/dL (ref 0.40–1.20)
GFR: 65.86 mL/min (ref >60.00)
Glucose, Bld: 99 mg/dL (ref 70–99)
Potassium: 4.2 mEq/L (ref 3.5–5.1)
Sodium: 141 mEq/L (ref 135–145)

## 2018-12-30 LAB — HEPATIC FUNCTION PANEL
ALT: 14 U/L (ref 0–35)
AST: 14 U/L (ref 0–37)
Albumin: 4.2 g/dL (ref 3.5–5.2)
Alkaline Phosphatase: 74 U/L (ref 39–117)
Bilirubin, Direct: 0.1 mg/dL (ref 0.0–0.3)
Total Bilirubin: 0.4 mg/dL (ref 0.2–1.2)
Total Protein: 6.6 g/dL (ref 6.0–8.3)

## 2018-12-30 LAB — CBC WITH DIFFERENTIAL/PLATELET
Basophils Absolute: 0.1 10*3/uL (ref 0.0–0.1)
Basophils Relative: 0.7 % (ref 0.0–3.0)
Eosinophils Absolute: 0.1 10*3/uL (ref 0.0–0.7)
Eosinophils Relative: 1.6 % (ref 0.0–5.0)
HCT: 42.9 % (ref 36.0–46.0)
Hemoglobin: 13.9 g/dL (ref 12.0–15.0)
Lymphocytes Relative: 26.3 % (ref 12.0–46.0)
Lymphs Abs: 2.4 10*3/uL (ref 0.7–4.0)
MCHC: 32.4 g/dL (ref 30.0–36.0)
MCV: 88.9 fl (ref 78.0–100.0)
Monocytes Absolute: 0.7 10*3/uL (ref 0.1–1.0)
Monocytes Relative: 7.6 % (ref 3.0–12.0)
Neutro Abs: 5.7 10*3/uL (ref 1.4–7.7)
Neutrophils Relative %: 63.8 % (ref 43.0–77.0)
Platelets: 290 10*3/uL (ref 150.0–400.0)
RBC: 4.83 Mil/uL (ref 3.87–5.11)
RDW: 14.3 % (ref 11.5–15.5)
WBC: 9 10*3/uL (ref 4.0–10.5)

## 2018-12-30 LAB — LIPID PANEL
Cholesterol: 210 mg/dL — ABNORMAL HIGH (ref 0–200)
HDL: 62.9 mg/dL (ref >39.00)
LDL Cholesterol: 124 mg/dL — ABNORMAL HIGH (ref 0–99)
NonHDL: 147.14
Total CHOL/HDL Ratio: 3
Triglycerides: 118 mg/dL (ref 0.0–149.0)
VLDL: 23.6 mg/dL (ref 0.0–40.0)

## 2018-12-30 LAB — TSH: TSH: 2.61 u[IU]/mL (ref 0.35–4.50)

## 2018-12-30 LAB — VITAMIN D 25 HYDROXY (VIT D DEFICIENCY, FRACTURES): VITD: 24.06 ng/mL — ABNORMAL LOW (ref 30.00–100.00)

## 2018-12-30 NOTE — Assessment & Plan Note (Addendum)
Physical today 12/30/18.  PAP 12/30/18.  Mammogram 11/05/18 - Birads I.  Colonoscopy 05/2016.  Recommended f/u 3 years.

## 2018-12-30 NOTE — Progress Notes (Signed)
Patient ID: VERONIQUE WARGA, female   DOB: Oct 30, 1955, 63 y.o.   MRN: [id number]   Subjective:    Patient ID: [name], female    DOB: Feb 22, 1956, 63 y.o.   MRN: [id number]  HPI  Patient here for her physical exam.  She reports she is doing relatively well.  Still with increased stress.  Still adjusting her her husband's death.  Also increased stress with work.  Overall she feels she is handling things relatively well.  Eating well.  No nausea or vomiting.  Bowels moving.  No abdominal pain.  Sees GI for crohn's.  Had colonoscopy 05/2016.  Recommended f/u in 2021.  Taking protonix for acid reflux.  Controls.  No chest pain.  No sob.  No increased cough or congestion.     Past Medical History:  Diagnosis Date  . Chlamydia   . Crohn's disease ([place])    large intestine  . GERD (gastroesophageal reflux disease)    Past Surgical History:  Procedure Laterality Date  . laparoscopy and tuba repair    . orif right calcaneus    . TONSILECTOMY/ADENOIDECTOMY WITH MYRINGOTOMY     Family History  Problem Relation Age of Onset  . Lymphoma Father   . Asthma Mother   . Osteoporosis Mother   . Mental illness Brother        suicide  . Pancreatic cancer Other        uncle  . COPD Maternal Grandmother   . Breast cancer Neg Hx   . Colon cancer Neg Hx    Social History   Socioeconomic History  . Marital status: Married    Spouse name: Not on file  . Number of children: 0  . Years of education: Not on file  . Highest education level: Not on file  Occupational History  . Not on file  Social Needs  . Financial resource strain: Not on file  . Food insecurity    Worry: Not on file    Inability: Not on file  . Transportation needs    Medical: Not on file    Non-medical: Not on file  Tobacco Use  . Smoking status: Former Smoker    Quit date: 03/11/1999    Years since quitting: 19.8  . Smokeless tobacco: Never Used  Substance and Sexual Activity  . Alcohol use: No    Alcohol/week:  0.0 standard drinks  . Drug use: No  . Sexual activity: Not on file  Lifestyle  . Physical activity    Days per week: Not on file    Minutes per session: Not on file  . Stress: Not on file  Relationships  . Social Herbalist on phone: Not on file    Gets together: Not on file    Attends religious service: Not on file    Active member of club or organization: Not on file    Attends meetings of clubs or organizations: Not on file    Relationship status: Not on file  Other Topics Concern  . Not on file  Social History Narrative  . Not on file    Outpatient Encounter Medications as of 12/30/2018  Medication Sig  . budesonide (ENTOCORT EC) 3 MG 24 hr capsule Take 9 mg by mouth daily.   Marland Kitchen buPROPion (WELLBUTRIN XL) 150 MG 24 hr tablet TAKE 1 TABLET BY MOUTH ONCE DAILY.  . cholecalciferol (VITAMIN D) 400 UNITS TABS tablet Take 400 Units by mouth 3 (three) times a week.  Marland Kitchen  fluticasone (FLONASE) 50 MCG/ACT nasal spray SPRAY 2 SPRAYS INTO EACH NOSTRIL ONCE DAILY  . LIALDA 1.2 g EC tablet   . pantoprazole (PROTONIX) 40 MG tablet Take 1 tablet (40 mg total) by mouth daily.  . valACYclovir (VALTREX) 1000 MG tablet Use as directed.  . zolpidem (AMBIEN) 5 MG tablet TAKE 1/2 TABLET BY MOUTH AT BEDTIME AS NEEDED.   No facility-administered encounter medications on file as of 12/30/2018.    Review of Systems  Constitutional: Negative for appetite change and unexpected weight change.  HENT: Negative for congestion and sinus pressure.   Eyes: Negative for pain and visual disturbance.  Respiratory: Negative for cough, chest tightness and shortness of breath.   Cardiovascular: Negative for chest pain, palpitations and leg swelling.  Gastrointestinal: Negative for abdominal pain, diarrhea, nausea and vomiting.  Genitourinary: Negative for difficulty urinating and dysuria.  Musculoskeletal: Negative for joint swelling and myalgias.  Skin: Negative for color change and rash.   Neurological: Negative for dizziness, light-headedness and headaches.  Hematological: Negative for adenopathy. Does not bruise/bleed easily.  Psychiatric/Behavioral: Negative for agitation and dysphoric mood.       Objective:    Physical Exam Constitutional:      General: She is not in acute distress.    Appearance: Normal appearance. She is well-developed.  HENT:     Head: Normocephalic and atraumatic.     Right Ear: External ear normal.     Left Ear: External ear normal.  Eyes:     General: No scleral icterus.       Right eye: No discharge.        Left eye: No discharge.     Conjunctiva/sclera: Conjunctivae normal.  Neck:     Musculoskeletal: Neck supple. No muscular tenderness.     Thyroid: No thyromegaly.  Cardiovascular:     Rate and Rhythm: Normal rate and regular rhythm.  Pulmonary:     Effort: No tachypnea, accessory muscle usage or respiratory distress.     Breath sounds: Normal breath sounds. No decreased breath sounds or wheezing.  Chest:     Breasts:        Right: No inverted nipple, mass, nipple discharge or tenderness (no axillary adenopathy).        Left: No inverted nipple, mass, nipple discharge or tenderness (no axilarry adenopathy).  Abdominal:     General: Bowel sounds are normal.     Palpations: Abdomen is soft.     Tenderness: There is no abdominal tenderness.  Genitourinary:    Comments: Normal external genitalia.  Vaginal vault without lesions.  Cervix identified.  Pap smear performed.  Could not appreciate any adnexal masses or tenderness.   Musculoskeletal:        General: No swelling or tenderness.  Lymphadenopathy:     Cervical: No cervical adenopathy.  Skin:    General: Skin is warm.     Findings: No erythema or rash.  Neurological:     Mental Status: She is alert and oriented to person, place, and time.  Psychiatric:        Mood and Affect: Mood normal.        Behavior: Behavior normal.     BP 138/78   Pulse 86   Temp (!) 97.3 F  (36.3 C)   Resp 16   Ht 5' 5"  (1.651 m)   Wt 160 lb 12.8 oz (72.9 kg)   LMP 04/12/2010   SpO2 98%   BMI 26.76 kg/m  Wt Readings from Last 3  Encounters:  12/30/18 160 lb 12.8 oz (72.9 kg)  03/15/18 155 lb 9.6 oz (70.6 kg)  11/06/17 154 lb 6.4 oz (70 kg)     Lab Results  Component Value Date   WBC 9.0 12/30/2018   HGB 13.9 12/30/2018   HCT 42.9 12/30/2018   PLT 290.0 12/30/2018   GLUCOSE 99 12/30/2018   CHOL 210 (H) 12/30/2018   TRIG 118.0 12/30/2018   HDL 62.90 12/30/2018   LDLCALC 124 (H) 12/30/2018   ALT 14 12/30/2018   AST 14 12/30/2018   NA 141 12/30/2018   K 4.2 12/30/2018   CL 102 12/30/2018   CREATININE 0.87 12/30/2018   BUN 17 12/30/2018   CO2 31 12/30/2018   TSH 2.61 12/30/2018    Mm 3d Screen Breast Bilateral  Result Date: 11/05/2018 CLINICAL DATA:  Screening. EXAM: DIGITAL SCREENING BILATERAL MAMMOGRAM WITH TOMO AND CAD COMPARISON:  Previous exam(s). ACR Breast Density Category c: The breast tissue is heterogeneously dense, which may obscure small masses. FINDINGS: There are no findings suspicious for malignancy. Images were processed with CAD. IMPRESSION: No mammographic evidence of malignancy. A result letter of this screening mammogram will be mailed directly to the patient. RECOMMENDATION: Screening mammogram in one year. (Code:SM-B-01Y) BI-RADS CATEGORY  1: Negative. Electronically Signed   By: Lovey Newcomer M.D.   On: 11/05/2018 11:23       Assessment & Plan:   Problem List Items Addressed This Visit    Anxiety    Overall feels she is handling things relatively well.  Follow.        Crohn's disease ([place])    Followed by GI.  Colonoscopy 05/2016.  Recommended f/u in 3 years.  Stable.       Relevant Orders   CBC with Differential/Platelet (Completed)   Hepatic function panel (Completed)   TSH (Completed)   Basic metabolic panel (Completed)   GERD (gastroesophageal reflux disease)    Controlled on current regimen.  Follow.        Health care  maintenance    Physical today 12/30/18.  PAP 12/30/18.  Mammogram 11/05/18 - Birads I.  Colonoscopy 05/2016.  Recommended f/u 3 years.        Osteoporosis    Has wanted to hold on further treatment.  Check vitamin D level.        Relevant Orders   VITAMIN D 25 Hydroxy (Vit-D Deficiency, Fractures) (Completed)    Other Visit Diagnoses    Routine general medical examination at a health care facility    -  Primary   Cervical cancer screening       Relevant Orders   Cytology - PAP( [place])   Screening cholesterol level       Relevant Orders   Lipid panel (Completed)   Need for immunization against influenza       Relevant Orders   Flu Vaccine QUAD 36+ mos IM (Completed)       [name], MD

## 2019-01-01 ENCOUNTER — Encounter: Payer: Self-pay | Admitting: Internal Medicine

## 2019-01-02 NOTE — Assessment & Plan Note (Signed)
Has wanted to hold on further treatment.  Check vitamin D level.

## 2019-01-02 NOTE — Assessment & Plan Note (Signed)
Controlled on current regimen.  Follow.  

## 2019-01-02 NOTE — Assessment & Plan Note (Signed)
Overall feels she is handling things relatively well.  Follow.

## 2019-01-02 NOTE — Assessment & Plan Note (Signed)
Followed by GI.  Colonoscopy 05/2016.  Recommended f/u in 3 years.  Stable.

## 2019-01-04 ENCOUNTER — Encounter: Payer: Self-pay | Admitting: Internal Medicine

## 2019-01-04 LAB — CYTOLOGY - PAP
Comment: NEGATIVE
Diagnosis: NEGATIVE
High risk HPV: NEGATIVE

## 2019-01-23 ENCOUNTER — Other Ambulatory Visit: Payer: Self-pay | Admitting: Internal Medicine

## 2019-01-24 NOTE — Telephone Encounter (Signed)
rx ok'd for ambien #15 with 1 refill.

## 2019-02-15 ENCOUNTER — Other Ambulatory Visit: Payer: Self-pay | Admitting: Internal Medicine

## 2019-02-15 DIAGNOSIS — Z1211 Encounter for screening for malignant neoplasm of colon: Secondary | ICD-10-CM

## 2019-02-15 NOTE — Progress Notes (Signed)
Order placed for ifob

## 2019-02-25 ENCOUNTER — Ambulatory Visit (INDEPENDENT_AMBULATORY_CARE_PROVIDER_SITE_OTHER): Payer: 59 | Admitting: Internal Medicine

## 2019-02-25 DIAGNOSIS — R030 Elevated blood-pressure reading, without diagnosis of hypertension: Secondary | ICD-10-CM

## 2019-02-25 DIAGNOSIS — F419 Anxiety disorder, unspecified: Secondary | ICD-10-CM | POA: Diagnosis not present

## 2019-02-25 DIAGNOSIS — K50919 Crohn's disease, unspecified, with unspecified complications: Secondary | ICD-10-CM | POA: Diagnosis not present

## 2019-02-25 DIAGNOSIS — G479 Sleep disorder, unspecified: Secondary | ICD-10-CM | POA: Diagnosis not present

## 2019-02-25 DIAGNOSIS — K219 Gastro-esophageal reflux disease without esophagitis: Secondary | ICD-10-CM | POA: Diagnosis not present

## 2019-02-25 MED ORDER — HYDROCHLOROTHIAZIDE 12.5 MG PO CAPS: 12.5000 mg | ORAL_CAPSULE | Freq: Every day | ORAL | 1 refills | Status: DC

## 2019-02-25 NOTE — Progress Notes (Signed)
Patient ID: Lauren Chang, female   DOB: May 01, 1955, 63 y.o.   MRN: [id number]   Virtual Visit via video Note  This visit type was conducted due to national recommendations for restrictions regarding the COVID-19 pandemic (e.g. social distancing).  This format is felt to be most appropriate for this patient at this time.  All issues noted in this document were discussed and addressed.  No physical exam was performed (except for noted visual exam findings with Video Visits).   I connected with Lauren Chang by a video enabled telemedicine application and verified that I am speaking with the correct person using two identifiers. Location patient: home Location provider: work Persons participating in the virtual visit: patient, provider  The limitations, risks, security and privacy concerns of performing an evaluation and management service by video and the availability of in person appointments have been discussed.   The patient expressed understanding and agreed to proceed.   Reason for visit: scheduled follow up.   HPI: Increased stress.  Discussed with her today.  Work stress.  Clients dealing with grief/loss.  Overall she feels she is handling things relatively well.   No chest pain or sob reported.  No abdominal pain or bowel change reported.  Colonoscopy 05/2016.  Recommended f/u in 2021.  Takes protonix for reflux.  Saw dermatology for skin check (third finger cyst).     ROS: See pertinent positives and negatives per HPI.  Past Medical History:  Diagnosis Date  . Chlamydia   . Crohn's disease ([place])    large intestine  . GERD (gastroesophageal reflux disease)     Past Surgical History:  Procedure Laterality Date  . laparoscopy and tuba repair    . orif right calcaneus    . TONSILECTOMY/ADENOIDECTOMY WITH MYRINGOTOMY      Family History  Problem Relation Age of Onset  . Lymphoma Father   . Asthma Mother   . Osteoporosis Mother   . Mental illness Brother    suicide  . Pancreatic cancer Other        uncle  . COPD Maternal Grandmother   . Breast cancer Neg Hx   . Colon cancer Neg Hx     SOCIAL HX: reviewed.    Current Outpatient Medications:  .  budesonide (ENTOCORT EC) 3 MG 24 hr capsule, Take 9 mg by mouth daily. , Disp: , Rfl: 3 .  buPROPion (WELLBUTRIN XL) 150 MG 24 hr tablet, TAKE 1 TABLET BY MOUTH ONCE DAILY., Disp: 30 tablet, Rfl: 10 .  cholecalciferol (VITAMIN D) 400 UNITS TABS tablet, Take 400 Units by mouth 3 (three) times a week., Disp: , Rfl:  .  fluticasone (FLONASE) 50 MCG/ACT nasal spray, SPRAY 2 SPRAYS INTO EACH NOSTRIL ONCE DAILY, Disp: 16 g, Rfl: 11 .  LIALDA 1.2 g EC tablet, , Disp: , Rfl:  .  pantoprazole (PROTONIX) 40 MG tablet, Take 1 tablet (40 mg total) by mouth daily., Disp: 30 tablet, Rfl: 3 .  valACYclovir (VALTREX) 1000 MG tablet, Use as directed., Disp: 30 tablet, Rfl: 0 .  zolpidem (AMBIEN) 5 MG tablet, TAKE 1/2 TABLET BY MOUTH AT BEDTIME AS NEEDED., Disp: 15 tablet, Rfl: 1 .  hydrochlorothiazide (MICROZIDE) 12.5 MG capsule, Take 1 capsule (12.5 mg total) by mouth daily., Disp: 30 capsule, Rfl: 1  EXAM:  GENERAL: alert, oriented, appears well and in no acute distress  HEENT: atraumatic, conjunttiva clear, no obvious abnormalities on inspection of external nose and ears  NECK: normal movements of the head and  neck  LUNGS: on inspection no signs of respiratory distress, breathing rate appears normal, no obvious gross SOB, gasping or wheezing  CV: no obvious cyanosis  PSYCH/NEURO: pleasant and cooperative, no obvious depression or anxiety, speech and thought processing grossly intact  ASSESSMENT AND PLAN:  Discussed the following assessment and plan:  Anxiety On wellbutrin.  Overall feels handling things relatively well.  Follow.    Crohn's disease Followed by GI.  Bowels stable.  Colonoscopy 05/2016.  Recommended f/u in 3 years.    GERD (gastroesophageal reflux disease) Controlled on protonix.   Follow.    Sleeping difficulty [place] as needed.  Follow.    Elevated blood pressure reading Follow pressures.  Send in readings.      I discussed the assessment and treatment plan with the patient. The patient was provided an opportunity to ask questions and all were answered. The patient agreed with the plan and demonstrated an understanding of the instructions.   The patient was advised to call back or seek an in-person evaluation if the symptoms worsen or if the condition fails to improve as anticipated.   [name], MD

## 2019-03-05 ENCOUNTER — Encounter: Payer: Self-pay | Admitting: Internal Medicine

## 2019-03-05 DIAGNOSIS — I10 Essential (primary) hypertension: Secondary | ICD-10-CM | POA: Insufficient documentation

## 2019-03-05 NOTE — Assessment & Plan Note (Signed)
Follow pressures.  Send in readings.

## 2019-03-05 NOTE — Assessment & Plan Note (Signed)
Takes [place] as needed.  Follow.

## 2019-03-05 NOTE — Assessment & Plan Note (Signed)
Followed by GI.  Bowels stable.  Colonoscopy 05/2016.  Recommended f/u in 3 years.

## 2019-03-05 NOTE — Assessment & Plan Note (Signed)
On wellbutrin.  Overall feels handling things relatively well.  Follow.

## 2019-03-05 NOTE — Assessment & Plan Note (Signed)
Controlled on protonix.  Follow.   

## 2019-03-25 ENCOUNTER — Other Ambulatory Visit: Payer: Self-pay | Admitting: Internal Medicine

## 2019-04-08 ENCOUNTER — Other Ambulatory Visit: Payer: Self-pay

## 2019-04-08 ENCOUNTER — Encounter: Payer: Self-pay | Admitting: Internal Medicine

## 2019-04-08 ENCOUNTER — Ambulatory Visit (INDEPENDENT_AMBULATORY_CARE_PROVIDER_SITE_OTHER): Payer: 59 | Admitting: Internal Medicine

## 2019-04-08 DIAGNOSIS — F419 Anxiety disorder, unspecified: Secondary | ICD-10-CM

## 2019-04-08 DIAGNOSIS — I10 Essential (primary) hypertension: Secondary | ICD-10-CM | POA: Diagnosis not present

## 2019-04-08 DIAGNOSIS — K219 Gastro-esophageal reflux disease without esophagitis: Secondary | ICD-10-CM

## 2019-04-08 DIAGNOSIS — K50919 Crohn's disease, unspecified, with unspecified complications: Secondary | ICD-10-CM

## 2019-04-08 MED ORDER — HYDROCHLOROTHIAZIDE 12.5 MG PO CAPS: 12.5000 mg | ORAL_CAPSULE | Freq: Every day | ORAL | 1 refills | Status: DC

## 2019-04-08 MED ORDER — BUPROPION HCL ER (XL) 150 MG PO TB24: 150.0000 mg | ORAL_TABLET | Freq: Every day | ORAL | 1 refills | Status: DC

## 2019-04-08 NOTE — Progress Notes (Signed)
Patient ID: Lauren Chang, female   DOB: 06-Aug-1955, 64 y.o.   MRN: [id number]   Virtual Visit via video Note  This visit type was conducted due to national recommendations for restrictions regarding the COVID-19 pandemic (e.g. social distancing).  This format is felt to be most appropriate for this patient at this time.  All issues noted in this document were discussed and addressed.  No physical exam was performed (except for noted visual exam findings with Video Visits).   I connected with [name] by a video enabled telemedicine application and verified that I am speaking with the correct person using two identifiers. Location patient: home Location provider: work Persons participating in the virtual visit: patient, provider  The limitations, risks, security and privacy concerns of performing an evaluation and management service by vidoe and the availability of in person appointments have been discussed. The patient expressed understanding and agreed to proceed.   Reason for visit: scheduled follow up.   HPI: She reports she is doing relatively well.  Increased stress.  Discussed with her today.  Overall handling things relatively well.  Does not feel needs any further intervention.  Taking wellbutrin.  Tries to stay active.  No chest pain or sob reported.  No significant abdominal pain.  Having some more frequent bowel movements.  Soft.  Eating.  No nausea or vomiting.  Feels bowels stable.  Blood pressures 120-140/70s.  Started on 1/2 hctz 03/12/19.     ROS: See pertinent positives and negatives per HPI.  Past Medical History:  Diagnosis Date  . Chlamydia   . Crohn's disease ([place])    large intestine  . GERD (gastroesophageal reflux disease)     Past Surgical History:  Procedure Laterality Date  . laparoscopy and tuba repair    . orif right calcaneus    . TONSILECTOMY/ADENOIDECTOMY WITH MYRINGOTOMY      Family History  Problem Relation Age of Onset  . Lymphoma  Father   . Asthma Mother   . Osteoporosis Mother   . Mental illness Brother        suicide  . Pancreatic cancer Other        uncle  . COPD Maternal Grandmother   . Breast cancer Neg Hx   . Colon cancer Neg Hx     SOCIAL HX: reviewed.    Current Outpatient Medications:  .  budesonide (ENTOCORT EC) 3 MG 24 hr capsule, Take 9 mg by mouth daily. , Disp: , Rfl: 3 .  buPROPion (WELLBUTRIN XL) 150 MG 24 hr tablet, Take 1 tablet (150 mg total) by mouth daily., Disp: 90 tablet, Rfl: 1 .  cholecalciferol (VITAMIN D) 400 UNITS TABS tablet, Take 400 Units by mouth 3 (three) times a week., Disp: , Rfl:  .  fluticasone (FLONASE) 50 MCG/ACT nasal spray, SPRAY 2 SPRAYS INTO EACH NOSTRIL ONCE DAILY, Disp: 16 g, Rfl: 11 .  hydrochlorothiazide (MICROZIDE) 12.5 MG capsule, Take 1 capsule (12.5 mg total) by mouth daily., Disp: 90 capsule, Rfl: 1 .  LIALDA 1.2 g EC tablet, , Disp: , Rfl:  .  pantoprazole (PROTONIX) 40 MG tablet, Take 1 tablet (40 mg total) by mouth daily., Disp: 30 tablet, Rfl: 3 .  valACYclovir (VALTREX) 1000 MG tablet, Use as directed., Disp: 30 tablet, Rfl: 0 .  zolpidem (AMBIEN) 5 MG tablet, TAKE 1/2 TABLET BY MOUTH AT BEDTIME AS NEEDED., Disp: 15 tablet, Rfl: 1  EXAM:  VITALS per patient if applicable: 856/31  GENERAL: alert, oriented, appears well  and in no acute distress  HEENT: atraumatic, conjunttiva clear, no obvious abnormalities on inspection of external nose and ears  NECK: normal movements of the head and neck  LUNGS: on inspection no signs of respiratory distress, breathing rate appears normal, no obvious gross SOB, gasping or wheezing  CV: no obvious cyanosis  PSYCH/NEURO: pleasant and cooperative, no obvious depression or anxiety, speech and thought processing grossly intact  ASSESSMENT AND PLAN:  Discussed the following assessment and plan:  Anxiety On wellbutrin.  Handling things well.  Follow.    Crohn's disease Colonoscopy 05/2016.  Recommended f/u in  3 years.  Overall she feels things are stable.  Follow.   Hypertension, essential Blood pressure as outlined.  On 1/2 hctz now.  Follow pressures.  Follow metabolic panel.   GERD (gastroesophageal reflux disease) Upper symptoms controlled.    Meds ordered this encounter  Medications  . buPROPion (WELLBUTRIN XL) 150 MG 24 hr tablet    Sig: Take 1 tablet (150 mg total) by mouth daily.    Dispense:  90 tablet    Refill:  1  . hydrochlorothiazide (MICROZIDE) 12.5 MG capsule    Sig: Take 1 capsule (12.5 mg total) by mouth daily.    Dispense:  90 capsule    Refill:  1     I discussed the assessment and treatment plan with the patient. The patient was provided an opportunity to ask questions and all were answered. The patient agreed with the plan and demonstrated an understanding of the instructions.   The patient was advised to call back or seek an in-person evaluation if the symptoms worsen or if the condition fails to improve as anticipated.   [name], MD

## 2019-04-10 ENCOUNTER — Encounter: Payer: Self-pay | Admitting: Internal Medicine

## 2019-04-10 NOTE — Assessment & Plan Note (Signed)
Upper symptoms controlled.

## 2019-04-10 NOTE — Assessment & Plan Note (Signed)
Colonoscopy 05/2016.  Recommended f/u in 3 years.  Overall she feels things are stable.  Follow.

## 2019-04-10 NOTE — Assessment & Plan Note (Signed)
On wellbutrin.  Handling things well.  Follow.

## 2019-04-10 NOTE — Assessment & Plan Note (Signed)
Blood pressure as outlined.  On 1/2 hctz now.  Follow pressures.  Follow metabolic panel.

## 2019-04-11 ENCOUNTER — Telehealth: Payer: Self-pay | Admitting: Internal Medicine

## 2019-04-11 NOTE — Telephone Encounter (Signed)
LMTCB and schedule a 70mfollow up and fasting labs in 3-4 weeks

## 2019-04-14 ENCOUNTER — Telehealth: Payer: Self-pay | Admitting: Internal Medicine

## 2019-04-14 NOTE — Telephone Encounter (Signed)
Please change lab orders so patient can go to [facility]. Patient will be going in the next 3 to 4 weeks to have done per Dr. Nicki Reaper.

## 2019-04-14 NOTE — Telephone Encounter (Signed)
Labs are in for [facility]

## 2019-04-18 ENCOUNTER — Encounter: Payer: Self-pay | Admitting: Internal Medicine

## 2019-05-09 DIAGNOSIS — R609 Edema, unspecified: Secondary | ICD-10-CM | POA: Insufficient documentation

## 2019-05-27 LAB — HEPATIC FUNCTION PANEL
ALT: 17 IU/L (ref 0–32)
AST: 16 IU/L (ref 0–40)
Albumin: 4.4 g/dL (ref 3.8–4.8)
Alkaline Phosphatase: 76 IU/L (ref 39–117)
Bilirubin Total: 0.3 mg/dL (ref 0.0–1.2)
Bilirubin, Direct: 0.1 mg/dL (ref 0.00–0.40)
Total Protein: 6.6 g/dL (ref 6.0–8.5)

## 2019-05-27 LAB — BASIC METABOLIC PANEL
BUN/Creatinine Ratio: 25 (ref 12–28)
BUN: 21 mg/dL (ref 8–27)
CO2: 22 mmol/L (ref 20–29)
Calcium: 9.6 mg/dL (ref 8.7–10.3)
Chloride: 97 mmol/L (ref 96–106)
Creatinine, Ser: 0.83 mg/dL (ref 0.57–1.00)
GFR calc Af Amer: 87 mL/min/{1.73_m2} (ref >59)
GFR calc non Af Amer: 76 mL/min/{1.73_m2} (ref >59)
Glucose: 86 mg/dL (ref 65–99)
Potassium: 3.7 mmol/L (ref 3.5–5.2)
Sodium: 138 mmol/L (ref 134–144)

## 2019-05-27 LAB — LIPID PANEL
Chol/HDL Ratio: 2.8 ratio (ref 0.0–4.4)
Cholesterol, Total: 179 mg/dL (ref 100–199)
HDL: 63 mg/dL (ref >39)
LDL Chol Calc (NIH): 99 mg/dL (ref 0–99)
Triglycerides: 92 mg/dL (ref 0–149)
VLDL Cholesterol Cal: 17 mg/dL (ref 5–40)

## 2019-05-28 ENCOUNTER — Encounter: Payer: Self-pay | Admitting: Internal Medicine

## 2019-07-15 ENCOUNTER — Encounter: Payer: Self-pay | Admitting: Internal Medicine

## 2019-07-15 ENCOUNTER — Telehealth (INDEPENDENT_AMBULATORY_CARE_PROVIDER_SITE_OTHER): Payer: 59 | Admitting: Internal Medicine

## 2019-07-15 DIAGNOSIS — I10 Essential (primary) hypertension: Secondary | ICD-10-CM

## 2019-07-15 DIAGNOSIS — K50919 Crohn's disease, unspecified, with unspecified complications: Secondary | ICD-10-CM

## 2019-07-15 DIAGNOSIS — F419 Anxiety disorder, unspecified: Secondary | ICD-10-CM

## 2019-07-15 DIAGNOSIS — K219 Gastro-esophageal reflux disease without esophagitis: Secondary | ICD-10-CM | POA: Diagnosis not present

## 2019-07-15 DIAGNOSIS — G479 Sleep disorder, unspecified: Secondary | ICD-10-CM

## 2019-07-15 DIAGNOSIS — M545 Low back pain, unspecified: Secondary | ICD-10-CM

## 2019-07-15 NOTE — Progress Notes (Signed)
Patient ID: Lauren Chang, female   DOB: 07/20/1955, 64 y.o.   MRN: [id number]   Virtual Visit via video Note  This visit type was conducted due to national recommendations for restrictions regarding the COVID-19 pandemic (e.g. social distancing).  This format is felt to be most appropriate for this patient at this time.  All issues noted in this document were discussed and addressed.  No physical exam was performed (except for noted visual exam findings with Video Visits).   I connected with [name] by a video enabled telemedicine application and verified that I am speaking with the correct person using two identifiers. Location patient: home Location provider: work  Persons participating in the virtual visit: patient, provider  The limitations, risks, security and privacy concerns of performing an evaluation and management service by video and the availability of in person appointments have been discussed. . The patient expressed understanding and agreed to proceed.   Reason for visit: scheduled follow up.    HPI: Here to follow up on her blood pressure, stress and Crohns.  She also reported she had been having some back discomfort.  Initially - pulled muscle.  Went to [employer].  Helped initially.  Persistent muscle spasm.  Now receiving deep tissue massage.  Some numbness in her hand.  Positional.  Computer glasses.  Sitting more erect.  She is getting orthotics. No chest pain or sob reported.  Eating.  No abdominal pain or bowel issues reported.  Handling stress.  Has not changed work hours.  Does request refill - ambien.       ROS: See pertinent positives and negatives per HPI.  Past Medical History:  Diagnosis Date  . Chlamydia   . Crohn's disease ([place])    large intestine  . GERD (gastroesophageal reflux disease)     Past Surgical History:  Procedure Laterality Date  . laparoscopy and tuba repair    . orif right calcaneus    . TONSILECTOMY/ADENOIDECTOMY WITH  MYRINGOTOMY      Family History  Problem Relation Age of Onset  . Lymphoma Father   . Asthma Mother   . Osteoporosis Mother   . Mental illness Brother        suicide  . Pancreatic cancer Other        uncle  . COPD Maternal Grandmother   . Breast cancer Neg Hx   . Colon cancer Neg Hx     SOCIAL HX: reviewed.    Current Outpatient Medications:  .  budesonide (ENTOCORT EC) 3 MG 24 hr capsule, Take 9 mg by mouth daily. , Disp: , Rfl: 3 .  buPROPion (WELLBUTRIN XL) 150 MG 24 hr tablet, Take 1 tablet (150 mg total) by mouth daily., Disp: 90 tablet, Rfl: 1 .  cholecalciferol (VITAMIN D) 400 UNITS TABS tablet, Take 400 Units by mouth 3 (three) times a week., Disp: , Rfl:  .  fluticasone (FLONASE) 50 MCG/ACT nasal spray, SPRAY 2 SPRAYS INTO EACH NOSTRIL ONCE DAILY, Disp: 16 g, Rfl: 11 .  hydrochlorothiazide (MICROZIDE) 12.5 MG capsule, Take 1 capsule (12.5 mg total) by mouth daily., Disp: 90 capsule, Rfl: 1 .  LIALDA 1.2 g EC tablet, , Disp: , Rfl:  .  pantoprazole (PROTONIX) 40 MG tablet, Take 1 tablet (40 mg total) by mouth daily., Disp: 30 tablet, Rfl: 3 .  valACYclovir (VALTREX) 1000 MG tablet, Use as directed., Disp: 30 tablet, Rfl: 0 .  zolpidem (AMBIEN) 5 MG tablet, TAKE 1/2 TABLET BY MOUTH AT BEDTIME AS  NEEDED., Disp: 15 tablet, Rfl: 1  EXAM:  VITALS per patient if applicable: 408/14, 481/85, 128/79  GENERAL: alert, oriented, appears well and in no acute distress  HEENT: atraumatic, conjunttiva clear, no obvious abnormalities on inspection of external nose and ears  NECK: normal movements of the head and neck  LUNGS: on inspection no signs of respiratory distress, breathing rate appears normal, no obvious gross SOB, gasping or wheezing  CV: no obvious cyanosis  PSYCH/NEURO: pleasant and cooperative, no obvious depression or anxiety, speech and thought processing grossly intact  ASSESSMENT AND PLAN:  Discussed the following assessment and plan:  Sleeping  difficulty Takes ambien prn.  Follow.   Hypertension, essential Blood pressures as outlined.  On hctz 12.45m q day.  Continue.  Continue to follow pressures.  Follow metabolic panel.    GERD (gastroesophageal reflux disease) No upper symptoms reported.  On protonix.    Crohn's disease Colonoscopy 2018.  Recommended f/u in 3 years.  Bowels stable.    Anxiety On wellbutrin.  Appears to be handling things well.  Follow.    Back pain Back discomfort as outlined.  Deep tissue massage, orthotics, position changes, etc.  Follow.  Notify me if a persistent problem.      I discussed the assessment and treatment plan with the patient. The patient was provided an opportunity to ask questions and all were answered. The patient agreed with the plan and demonstrated an understanding of the instructions.   The patient was advised to call back or seek an in-person evaluation if the symptoms worsen or if the condition fails to improve as anticipated.  I provided 22 minutes of non-face-to-face time during this encounter.   CEinar Pheasant MD

## 2019-07-16 ENCOUNTER — Encounter: Payer: Self-pay | Admitting: Internal Medicine

## 2019-07-16 ENCOUNTER — Other Ambulatory Visit: Payer: Self-pay | Admitting: Internal Medicine

## 2019-07-17 ENCOUNTER — Encounter: Payer: Self-pay | Admitting: Internal Medicine

## 2019-07-17 DIAGNOSIS — M549 Dorsalgia, unspecified: Secondary | ICD-10-CM | POA: Insufficient documentation

## 2019-07-17 NOTE — Assessment & Plan Note (Signed)
Takes ambien prn.  Follow.

## 2019-07-17 NOTE — Assessment & Plan Note (Signed)
Blood pressures as outlined.  On hctz 12.39m q day.  Continue.  Continue to follow pressures.  Follow metabolic panel.

## 2019-07-17 NOTE — Assessment & Plan Note (Signed)
On wellbutrin.  Appears to be handling things well.  Follow.

## 2019-07-17 NOTE — Assessment & Plan Note (Signed)
Back discomfort as outlined.  Deep tissue massage, orthotics, position changes, etc.  Follow.  Notify me if a persistent problem.

## 2019-07-17 NOTE — Assessment & Plan Note (Signed)
No upper symptoms reported.  On protonix.

## 2019-07-17 NOTE — Assessment & Plan Note (Signed)
Colonoscopy 2018.  Recommended f/u in 3 years.  Bowels stable.

## 2019-07-18 NOTE — Telephone Encounter (Signed)
Refill request for Lauren Chang, last seen 07-15-19, last filled 01-24-19.  Please advise.

## 2019-07-18 NOTE — Addendum Note (Signed)
Addended by: Alisa Graff on: 07/18/2019 01:16 PM   Modules accepted: Level of Service

## 2019-07-28 ENCOUNTER — Other Ambulatory Visit: Payer: Self-pay | Admitting: Internal Medicine

## 2019-07-28 NOTE — Telephone Encounter (Signed)
I declined prescription.  Please call pt and confirm she is aware rx sent in.

## 2019-07-28 NOTE — Telephone Encounter (Signed)
Refill request for Lauren Chang, last seen 07-15-19, last filled 07-19-19.  Please advise. I can not decline request.

## 2019-07-28 NOTE — Telephone Encounter (Signed)
Patient stated the pharmacy told her that the medication was not received, chart said it was sent. [place] and that statement was true. Last RX was in January per pharmacy. Please advise.

## 2019-07-29 MED ORDER — ZOLPIDEM TARTRATE 5 MG PO TABS: 2.5000 mg | ORAL_TABLET | Freq: Every evening | ORAL | 0 refills | Status: DC | PRN

## 2019-07-29 NOTE — Telephone Encounter (Signed)
Reviewed chart.  Reviewed message.  [facility] [employer] [facility] confirmed last rx 03/2019.  Reviewed PDMP - no refill since 03/2019.  rx sent in to [facility].  Please confirm received and notify pt.

## 2019-07-29 NOTE — Telephone Encounter (Signed)
Patient confirmed rx received.

## 2019-10-15 ENCOUNTER — Other Ambulatory Visit: Payer: Self-pay | Admitting: Internal Medicine

## 2019-10-18 NOTE — Telephone Encounter (Signed)
rx ok'd for ambien #15 with no refills.

## 2019-10-21 ENCOUNTER — Ambulatory Visit: Payer: 59 | Admitting: Internal Medicine

## 2019-10-28 ENCOUNTER — Encounter: Payer: Self-pay | Admitting: Internal Medicine

## 2019-10-28 ENCOUNTER — Telehealth (INDEPENDENT_AMBULATORY_CARE_PROVIDER_SITE_OTHER): Payer: 59 | Admitting: Internal Medicine

## 2019-10-28 DIAGNOSIS — Z1231 Encounter for screening mammogram for malignant neoplasm of breast: Secondary | ICD-10-CM

## 2019-10-28 DIAGNOSIS — M810 Age-related osteoporosis without current pathological fracture: Secondary | ICD-10-CM | POA: Diagnosis not present

## 2019-10-28 DIAGNOSIS — K219 Gastro-esophageal reflux disease without esophagitis: Secondary | ICD-10-CM

## 2019-10-28 DIAGNOSIS — G479 Sleep disorder, unspecified: Secondary | ICD-10-CM

## 2019-10-28 DIAGNOSIS — F419 Anxiety disorder, unspecified: Secondary | ICD-10-CM

## 2019-10-28 DIAGNOSIS — I10 Essential (primary) hypertension: Secondary | ICD-10-CM | POA: Diagnosis not present

## 2019-10-28 DIAGNOSIS — K50919 Crohn's disease, unspecified, with unspecified complications: Secondary | ICD-10-CM

## 2019-10-28 NOTE — Progress Notes (Signed)
Patient ID: Lauren Chang, female   DOB: 24-Feb-1956, 64 y.o.   MRN: [id number]   Virtual Visit via video Note  This visit type was conducted due to national recommendations for restrictions regarding the COVID-19 pandemic (e.g. social distancing).  This format is felt to be most appropriate for this patient at this time.  All issues noted in this document were discussed and addressed.  No physical exam was performed (except for noted visual exam findings with Video Visits).   I connected with [name] by a video enabled telemedicine application and verified that I am speaking with the correct person using two identifiers. Location patient: home Location provider: work Persons participating in the virtual visit: patient, provider  The limitations, risks, security and privacy concerns of performing an evaluation and management service by video and the availability of in person appointments have been discussed. It has also been discussed with the patient that there may be a patient responsible charge related to this service. The patient expressed understanding and agreed to proceed.   Reason for visit: scheduled follow up.   HPI: She reports she is doing relatively well.  She is eating.  No nausea or vomiting. Bowels stable.  Sees GI. Last evaluated 08/15/19.  No changes made.  No chest pain or sob reported.  Had finger joint flare.  Is better.  Wearing compression hose.  Helps.  Takes ambien prn.  Increased stress - with work.  Discussed with her today.  Does not feel needs any further intervention.     ROS: See pertinent positives and negatives per HPI.  Past Medical History:  Diagnosis Date  . Chlamydia   . Crohn's disease ([place])    large intestine  . GERD (gastroesophageal reflux disease)     Past Surgical History:  Procedure Laterality Date  . laparoscopy and tuba repair    . orif right calcaneus    . TONSILECTOMY/ADENOIDECTOMY WITH MYRINGOTOMY      Family History   Problem Relation Age of Onset  . Lymphoma Father   . Asthma Mother   . Osteoporosis Mother   . Mental illness Brother        suicide  . Pancreatic cancer Other        uncle  . COPD Maternal Grandmother   . Breast cancer Neg Hx   . Colon cancer Neg Hx     SOCIAL HX: reviewed.    Current Outpatient Medications:  .  budesonide (ENTOCORT EC) 3 MG 24 hr capsule, Take 9 mg by mouth daily. , Disp: , Rfl: 3 .  buPROPion (WELLBUTRIN XL) 150 MG 24 hr tablet, TAKE 1 TABLET BY MOUTH ONCE DAILY., Disp: 30 tablet, Rfl: 0 .  cholecalciferol (VITAMIN D) 400 UNITS TABS tablet, Take 400 Units by mouth 3 (three) times a week., Disp: , Rfl:  .  fluticasone (FLONASE) 50 MCG/ACT nasal spray, SPRAY 2 SPRAYS INTO EACH NOSTRIL ONCE DAILY, Disp: 16 g, Rfl: 11 .  hydrochlorothiazide (MICROZIDE) 12.5 MG capsule, TAKE (1) CAPSULE BY MOUTH ONCE DAILY., Disp: 30 capsule, Rfl: 0 .  LIALDA 1.2 g EC tablet, , Disp: , Rfl:  .  pantoprazole (PROTONIX) 40 MG tablet, Take 1 tablet (40 mg total) by mouth daily., Disp: 30 tablet, Rfl: 3 .  valACYclovir (VALTREX) 1000 MG tablet, Use as directed., Disp: 30 tablet, Rfl: 0 .  zolpidem (AMBIEN) 5 MG tablet, TAKE 1/2 TABLET BY MOUTH AT BEDTIME AS NEEDED., Disp: 15 tablet, Rfl: 0  EXAM:  GENERAL: alert, oriented,  appears well and in no acute distress  HEENT: atraumatic, conjunttiva clear, no obvious abnormalities on inspection of external nose and ears  NECK: normal movements of the head and neck  LUNGS: on inspection no signs of respiratory distress, breathing rate appears normal, no obvious gross SOB, gasping or wheezing  CV: no obvious cyanosis  PSYCH/NEURO: pleasant and cooperative, no obvious depression or anxiety, speech and thought processing grossly intact  ASSESSMENT AND PLAN:  Discussed the following assessment and plan:  Sleeping difficulty Takes ambien prn.  Follow.   Osteoporosis Follow vitamin D level.   Hypertension, essential On hctz.  Follow  pressure. Follow metabolic panel.   GERD (gastroesophageal reflux disease) No upper symptoms reported.  On protonix.    Crohn's disease Documented colonoscopy 2018.  Seeing GI.  Recommended f/u in 3 years.  Bowels stable.   Anxiety Increased stress as outlined.  On wellbutrin.  Discussed with her today.  Overall she feels she is handling things relatively well.  Follow.   Breast cancer screening Mammogram 11/05/19 - [facility].  Schedule f/u mammogram.    Orders Placed This Encounter  Procedures  . MM 3D SCREEN BREAST BILATERAL    Standing Status:   Future    Standing Expiration Date:   11/05/2020    Order Specific Question:   Reason for Exam (SYMPTOM  OR DIAGNOSIS REQUIRED)    Answer:   breast cancer screening    Order Specific Question:   Preferred imaging location?    Answer:   [place]     I discussed the assessment and treatment plan with the patient. The patient was provided an opportunity to ask questions and all were answered. The patient agreed with the plan and demonstrated an understanding of the instructions.   The patient was advised to call back or seek an in-person evaluation if the symptoms worsen or if the condition fails to improve as anticipated.    [name], MD

## 2019-11-06 ENCOUNTER — Encounter: Payer: Self-pay | Admitting: Internal Medicine

## 2019-11-06 DIAGNOSIS — Z1239 Encounter for other screening for malignant neoplasm of breast: Secondary | ICD-10-CM | POA: Insufficient documentation

## 2019-11-06 NOTE — Assessment & Plan Note (Signed)
Increased stress as outlined.  On wellbutrin.  Discussed with her today.  Overall she feels she is handling things relatively well.  Follow.

## 2019-11-06 NOTE — Assessment & Plan Note (Signed)
No upper symptoms reported.  On protonix.

## 2019-11-06 NOTE — Assessment & Plan Note (Signed)
Follow vitamin D level.

## 2019-11-06 NOTE — Assessment & Plan Note (Signed)
Mammogram 11/05/19 - [facility].  Schedule f/u mammogram.

## 2019-11-06 NOTE — Assessment & Plan Note (Signed)
Documented colonoscopy 2018.  Seeing GI.  Recommended f/u in 3 years.  Bowels stable.

## 2019-11-06 NOTE — Assessment & Plan Note (Signed)
On hctz.  Follow pressure. Follow metabolic panel.

## 2019-11-06 NOTE — Assessment & Plan Note (Signed)
Takes ambien prn.  Follow.

## 2019-11-14 ENCOUNTER — Other Ambulatory Visit: Payer: Self-pay | Admitting: Internal Medicine

## 2019-12-02 ENCOUNTER — Encounter: Payer: Self-pay | Admitting: Internal Medicine

## 2019-12-14 ENCOUNTER — Other Ambulatory Visit: Payer: Self-pay | Admitting: Internal Medicine

## 2020-01-09 ENCOUNTER — Ambulatory Visit: Payer: 59

## 2020-01-12 ENCOUNTER — Other Ambulatory Visit: Payer: Self-pay | Admitting: Internal Medicine

## 2020-01-25 ENCOUNTER — Other Ambulatory Visit: Payer: Self-pay

## 2020-01-25 ENCOUNTER — Other Ambulatory Visit
Admission: RE | Admit: 2020-01-25 | Discharge: 2020-01-25 | Disposition: A | Discharge status: Home / Self Care | Payer: 59 | Source: Ambulatory Visit | Attending: Gastroenterology | Admitting: Gastroenterology

## 2020-01-25 DIAGNOSIS — Z20822 Contact with and (suspected) exposure to covid-19: Secondary | ICD-10-CM | POA: Diagnosis not present

## 2020-01-25 DIAGNOSIS — Z01812 Encounter for preprocedural laboratory examination: Secondary | ICD-10-CM | POA: Diagnosis not present

## 2020-01-25 LAB — SARS CORONAVIRUS 2 (TAT 6-24 HRS): SARS Coronavirus 2: NEGATIVE

## 2020-01-27 ENCOUNTER — Ambulatory Visit: Payer: 59 | Admitting: Registered Nurse

## 2020-01-27 ENCOUNTER — Other Ambulatory Visit: Payer: Self-pay

## 2020-01-27 ENCOUNTER — Encounter
Admission: RE | Disposition: A | Discharge status: Home / Self Care | Payer: Self-pay | Source: Home / Self Care | Attending: Gastroenterology

## 2020-01-27 ENCOUNTER — Encounter: Payer: Self-pay | Admitting: *Deleted

## 2020-01-27 ENCOUNTER — Ambulatory Visit
Admission: RE | Admit: 2020-01-27 | Discharge: 2020-01-27 | Disposition: A | Discharge status: Home / Self Care | Payer: 59 | Attending: Gastroenterology | Admitting: Gastroenterology

## 2020-01-27 DIAGNOSIS — Z79899 Other long term (current) drug therapy: Secondary | ICD-10-CM | POA: Insufficient documentation

## 2020-01-27 DIAGNOSIS — Z87891 Personal history of nicotine dependence: Secondary | ICD-10-CM | POA: Diagnosis not present

## 2020-01-27 DIAGNOSIS — K6389 Other specified diseases of intestine: Secondary | ICD-10-CM | POA: Diagnosis not present

## 2020-01-27 DIAGNOSIS — K640 First degree hemorrhoids: Secondary | ICD-10-CM | POA: Insufficient documentation

## 2020-01-27 DIAGNOSIS — K5080 Crohn's disease of both small and large intestine without complications: Secondary | ICD-10-CM | POA: Diagnosis not present

## 2020-01-27 DIAGNOSIS — K529 Noninfective gastroenteritis and colitis, unspecified: Secondary | ICD-10-CM | POA: Diagnosis not present

## 2020-01-27 HISTORY — PX: COLONOSCOPY WITH PROPOFOL: SHX5780

## 2020-01-27 SURGERY — COLONOSCOPY WITH PROPOFOL
Anesthesia: General

## 2020-01-27 MED ORDER — LIDOCAINE HCL (CARDIAC) PF 100 MG/5ML IV SOSY
PREFILLED_SYRINGE | INTRAVENOUS | Status: DC | PRN
  Administered 2020-01-27: 80 mg via INTRAVENOUS
  Administered 2020-01-27: 20 mg via INTRAVENOUS

## 2020-01-27 MED ORDER — PROPOFOL 10 MG/ML IV BOLUS
INTRAVENOUS | Status: DC | PRN
  Administered 2020-01-27: 70 mg via INTRAVENOUS
  Administered 2020-01-27: 30 mg via INTRAVENOUS

## 2020-01-27 MED ORDER — SODIUM CHLORIDE 0.9 % IV SOLN: INTRAVENOUS | Status: DC

## 2020-01-27 MED ORDER — PROPOFOL 500 MG/50ML IV EMUL
INTRAVENOUS | Status: DC | PRN
  Administered 2020-01-27: 150 ug/kg/min via INTRAVENOUS

## 2020-01-27 NOTE — Op Note (Signed)
[facility] Gastroenterology Patient Name: Lauren Chang Procedure Date: 01/27/2020 2:09 PM MRN: [id number] Account #: [id number] Date of Birth: 26-May-1955 Admit Type: Outpatient Age: 64 Room: [facility] ENDO ROOM 3 Gender: Female Note Status: Finalized Procedure:             Colonoscopy Indications:           Crohn's disease of the small bowel and colon Providers:             Andrey Farmer MD, MD Referring MD:          [name], MD (Referring MD) Medicines:             Monitored Anesthesia Care Complications:         No immediate complications. Estimated blood loss:                         Minimal. Procedure:             Pre-Anesthesia Assessment:                        - Prior to the procedure, a History and Physical was                         performed, and patient medications and allergies were                         reviewed. The patient is competent. The risks and                         benefits of the procedure and the sedation options and                         risks were discussed with the patient. All questions                         were answered and informed consent was obtained.                         Patient identification and proposed procedure were                         verified by the physician, the nurse, the anesthetist                         and the technician in the endoscopy suite. Mental                         Status Examination: alert and oriented. Airway                         Examination: normal oropharyngeal airway and neck                         mobility. Respiratory Examination: clear to                         auscultation. CV Examination: normal. Prophylactic  Antibiotics: The patient does not require prophylactic                         antibiotics. Prior Anticoagulants: The patient has                         taken no previous anticoagulant or antiplatelet                         agents. ASA  Grade Assessment: III - A patient with                         severe systemic disease. After reviewing the risks and                         benefits, the patient was deemed in satisfactory                         condition to undergo the procedure. The anesthesia                         plan was to use monitored anesthesia care (MAC).                         Immediately prior to administration of medications,                         the patient was re-assessed for adequacy to receive                         sedatives. The heart rate, respiratory rate, oxygen                         saturations, blood pressure, adequacy of pulmonary                         ventilation, and response to care were monitored                         throughout the procedure. The physical status of the                         patient was re-assessed after the procedure.                        After obtaining informed consent, the colonoscope was                         passed under direct vision. Throughout the procedure,                         the patient's blood pressure, pulse, and oxygen                         saturations were monitored continuously. The                         Colonoscope was introduced through the anus and  advanced to the the cecum, identified by appendiceal                         orifice and ileocecal valve. The colonoscopy was                         technically difficult and complex due to significant                         looping. Successful completion of the procedure was                         aided by applying abdominal pressure. The patient                         tolerated the procedure well. The quality of the bowel                         preparation was good. Findings:      The perianal and digital rectal examinations were normal.      Patchy pseudopolyps were found in the sigmoid colon, in the descending       colon, in the transverse colon and  in the ascending colon. Biopsies were       taken with a cold forceps for histology of one pseudopolyp at hepatic       flexture. Estimated blood loss was minimal.      Normal mucosa was found in the entire colon. Biopsies were taken with a       cold forceps for histology. Estimated blood loss was minimal. Do       significant looping, unable to intubate the TI. Can evaluate with CT       enterography if needed.      Non-bleeding internal hemorrhoids were found during retroflexion. The       hemorrhoids were Grade I (internal hemorrhoids that do not prolapse).      The exam was otherwise without abnormality on direct and retroflexion       views. Impression:            - Pseudopolyps in the sigmoid colon, in the descending                         colon, in the transverse colon and in the ascending                         colon. Biopsied.                        - Normal mucosa in the entire examined colon. Biopsied.                        - Non-bleeding internal hemorrhoids.                        - The examination was otherwise normal on direct and                         retroflexion views. Recommendation:        - Discharge patient to home.                        -  Resume previous diet.                        - Continue present medications.                        - Await pathology results.                        - Repeat colonoscopy for surveillance based on                         pathology results.                        - Return to referring physician as previously                         scheduled.                        - Perform a computed tomographic (CT scan)                         enterography as unable to intubate the TI. Procedure Code(s):     --- Professional ---                        [phone], Colonoscopy, flexible; with biopsy, single or                         multiple Diagnosis Code(s):     --- Professional ---                        K51.40, Inflammatory polyps of  colon without                         complications                        K64.0, First degree hemorrhoids                        K50.80, Crohn's disease of both small and large                         intestine without complications CPT copyright 2019 American Medical Association. All rights reserved. The codes documented in this report are preliminary and upon coder review may  be revised to meet current compliance requirements. Andrey Farmer, MD Andrey Farmer MD, MD 01/27/2020 2:48:41 PM Number of Addenda: 0 Note Initiated On: 01/27/2020 2:09 PM Scope Withdrawal Time: 0 hours 7 minutes 2 seconds  Total Procedure Duration: 0 hours 23 minutes 16 seconds  Estimated Blood Loss:  Estimated blood loss was minimal.      [facility]

## 2020-01-27 NOTE — Interval H&P Note (Signed)
History and Physical Interval Note:  01/27/2020 2:10 PM  Lauren Chang  has presented today for surgery, with the diagnosis of [place].  The various methods of treatment have been discussed with the patient and family. After consideration of risks, benefits and other options for treatment, the patient has consented to  Procedure(s): COLONOSCOPY WITH PROPOFOL (N/A) as a surgical intervention.  The patient's history has been reviewed, patient examined, no change in status, stable for surgery.  I have reviewed the patient's chart and labs.  Questions were answered to the patient's satisfaction.     Lesly Rubenstein  Ok to proceed with colonoscopy

## 2020-01-27 NOTE — H&P (Signed)
Outpatient short stay form Pre-procedure 01/27/2020 2:08 PM Raylene Miyamoto MD, MPH  Primary Physician: Dr. Nicki Reaper  Reason for visit:  Assess for crohns  History of present illness:   64 y/o lady with reported history of crohns here for assessment of disease activity. Had normal fecal calprotectin but having looser stools associated with stress. No abdominal pain. No abdominal surgeries. No GI bleeding. No blood thinners.    Current Facility-Administered Medications:  .  0.9 %  sodium chloride infusion, , Intravenous, Continuous, Pascha Fogal, Hilton Cork, MD, Last Rate: 20 mL/hr at 01/27/20 1339, New Bag at 01/27/20 1339  Medications Prior to Admission  Medication Sig Dispense Refill Last Dose  . budesonide (ENTOCORT EC) 3 MG 24 hr capsule Take 9 mg by mouth daily.   3 01/26/2020 at Unknown time  . buPROPion (WELLBUTRIN XL) 150 MG 24 hr tablet TAKE 1 TABLET BY MOUTH ONCE DAILY. 30 tablet 0 01/26/2020 at Unknown time  . cholecalciferol (VITAMIN D) 400 UNITS TABS tablet Take 400 Units by mouth 3 (three) times a week.   Past Week at Unknown time  . fluticasone (FLONASE) 50 MCG/ACT nasal spray SPRAY 2 SPRAYS INTO EACH NOSTRIL ONCE DAILY 16 g 11 Past Month at Unknown time  . hydrochlorothiazide (MICROZIDE) 12.5 MG capsule TAKE (1) CAPSULE BY MOUTH ONCE DAILY. 30 capsule 0 01/27/2020 at Unknown time  . LIALDA 1.2 g EC tablet    01/26/2020 at Unknown time  . pantoprazole (PROTONIX) 40 MG tablet Take 1 tablet (40 mg total) by mouth daily. 30 tablet 3 01/26/2020 at Unknown time  . valACYclovir (VALTREX) 1000 MG tablet Use as directed. 30 tablet 0 Past Month at Unknown time  . zolpidem (AMBIEN) 5 MG tablet TAKE 1/2 TABLET BY MOUTH AT BEDTIME AS NEEDED. 15 tablet 0 01/26/2020 at Unknown time     Allergies  Allergen Reactions  . Bactrim [Sulfamethoxazole-Trimethoprim] Nausea Only  . Ciprofloxacin Other (See Comments)    Arthralgias   . Ibuprofen Rash  . Oxycodone Rash  . Oxycontin [Oxycodone Hcl]  Rash     Past Medical History:  Diagnosis Date  . Chlamydia   . Crohn's disease ([place])    large intestine  . GERD (gastroesophageal reflux disease)     Review of systems:  Otherwise negative.    Physical Exam  Gen: Alert, oriented. Appears stated age.  HEENT: PERRLA. Lungs: No respiratory distress CV: RRR Abd: soft, benign, no masses.  Ext: No edema.     Planned procedures: Proceed with colonoscopy. The patient understands the nature of the planned procedure, indications, risks, alternatives and potential complications including but not limited to bleeding, infection, perforation, damage to internal organs and possible oversedation/side effects from anesthesia. The patient agrees and gives consent to proceed.  Please refer to procedure notes for findings, recommendations and patient disposition/instructions.     Raylene Miyamoto MD, MPH Gastroenterology 01/27/2020  2:08 PM

## 2020-01-27 NOTE — Transfer of Care (Signed)
Immediate Anesthesia Transfer of Care Note  Patient: Lauren Chang  Procedure(s) Performed: COLONOSCOPY WITH PROPOFOL (N/A )  Patient Location: PACU and Endoscopy Unit  Anesthesia Type:General  Level of Consciousness: drowsy  Airway & Oxygen Therapy: Patient Spontanous Breathing  Post-op Assessment: Report given to RN and Post -op Vital signs reviewed and stable  Post vital signs: Reviewed and stable  Last Vitals:  Vitals Value Taken Time  BP 124/71   Temp 36   Pulse 85   Resp 16   SpO2 100 %     Last Pain:  Vitals:   01/27/20 1322  TempSrc: Temporal  PainSc: 0-No pain         Complications: No complications documented.

## 2020-01-27 NOTE — Anesthesia Preprocedure Evaluation (Signed)
Anesthesia Evaluation  Patient identified by MRN, date of birth, ID band Patient awake    Reviewed: Allergy & Precautions, H&P , NPO status , Patient's Chart, lab work & pertinent test results, reviewed documented beta blocker date and time   History of Anesthesia Complications Negative for: history of anesthetic complications  Airway Mallampati: II  TM Distance: >3 FB Neck ROM: full    Dental  (+) Dental Advidsory Given, Teeth Intact, Chipped   Pulmonary neg pulmonary ROS, former smoker,    Pulmonary exam normal breath sounds clear to auscultation       Cardiovascular Exercise Tolerance: Good hypertension, (-) angina(-) Past MI and (-) Cardiac Stents Normal cardiovascular exam(-) dysrhythmias (-) Valvular Problems/Murmurs Rhythm:regular Rate:Normal     Neuro/Psych PSYCHIATRIC DISORDERS Anxiety negative neurological ROS     GI/Hepatic Neg liver ROS, GERD  ,  Endo/Other  negative endocrine ROS  Renal/GU negative Renal ROS  negative genitourinary   Musculoskeletal   Abdominal   Peds  Hematology negative hematology ROS (+)   Anesthesia Other Findings Past Medical History: No date: Chlamydia No date: Crohn's disease ([facility])     Comment:  large intestine No date: GERD (gastroesophageal reflux disease)   Reproductive/Obstetrics negative OB ROS                             Anesthesia Physical Anesthesia Plan  ASA: II  Anesthesia Plan: General   Post-op Pain Management:    Induction: Intravenous  PONV Risk Score and Plan: 3 and Propofol infusion and TIVA  Airway Management Planned: Natural Airway and Nasal Cannula  Additional Equipment:   Intra-op Plan:   Post-operative Plan:   Informed Consent: I have reviewed the patients History and Physical, chart, labs and discussed the procedure including the risks, benefits and alternatives for the proposed anesthesia with the patient or  authorized representative who has indicated his/her understanding and acceptance.     Dental Advisory Given  Plan Discussed with: Anesthesiologist, CRNA and Surgeon  Anesthesia Plan Comments:         Anesthesia Quick Evaluation

## 2020-01-27 NOTE — Anesthesia Postprocedure Evaluation (Signed)
Anesthesia Post Note  Patient: EMBREE BRAWLEY  Procedure(s) Performed: COLONOSCOPY WITH PROPOFOL (N/A )  Patient location during evaluation: Endoscopy Anesthesia Type: General Level of consciousness: awake and alert Pain management: pain level controlled Vital Signs Assessment: post-procedure vital signs reviewed and stable Respiratory status: spontaneous breathing, nonlabored ventilation, respiratory function stable and patient connected to nasal cannula oxygen Cardiovascular status: blood pressure returned to baseline and stable Postop Assessment: no apparent nausea or vomiting Anesthetic complications: no   No complications documented.   Last Vitals:  Vitals:   01/27/20 1322 01/27/20 1449  BP: (!) 149/85 120/75  Pulse: 98 84  Resp: 17 14  Temp: 36.9 C 36.8 C  SpO2: 96% 98%    Last Pain:  Vitals:   01/27/20 1449  TempSrc:   PainSc: Asleep                 Martha Clan

## 2020-01-28 NOTE — Progress Notes (Signed)
Voicemail.  No Message Left.

## 2020-01-31 LAB — SURGICAL PATHOLOGY

## 2020-02-06 ENCOUNTER — Encounter: Payer: Self-pay | Admitting: Internal Medicine

## 2020-02-06 ENCOUNTER — Other Ambulatory Visit: Payer: Self-pay | Admitting: Internal Medicine

## 2020-02-06 ENCOUNTER — Other Ambulatory Visit: Payer: Self-pay

## 2020-02-06 ENCOUNTER — Ambulatory Visit
Admission: RE | Admit: 2020-02-06 | Discharge: 2020-02-06 | Disposition: A | Discharge status: Home / Self Care | Payer: 59 | Source: Ambulatory Visit | Attending: Internal Medicine | Admitting: Internal Medicine

## 2020-02-06 DIAGNOSIS — Z1231 Encounter for screening mammogram for malignant neoplasm of breast: Secondary | ICD-10-CM

## 2020-02-06 DIAGNOSIS — R928 Other abnormal and inconclusive findings on diagnostic imaging of breast: Secondary | ICD-10-CM

## 2020-02-06 NOTE — Progress Notes (Signed)
Order placed for f/u right breast diagnostic mammogram and ultrasound

## 2020-02-08 ENCOUNTER — Other Ambulatory Visit: Payer: Self-pay | Admitting: Internal Medicine

## 2020-02-08 NOTE — Telephone Encounter (Signed)
rx ok'd for ambien #15 with no refills.

## 2020-02-20 ENCOUNTER — Ambulatory Visit
Admission: RE | Admit: 2020-02-20 | Discharge: 2020-02-20 | Disposition: A | Discharge status: Home / Self Care | Payer: 59 | Source: Ambulatory Visit | Attending: Internal Medicine | Admitting: Internal Medicine

## 2020-02-20 ENCOUNTER — Other Ambulatory Visit: Payer: Self-pay

## 2020-02-20 DIAGNOSIS — R928 Other abnormal and inconclusive findings on diagnostic imaging of breast: Secondary | ICD-10-CM | POA: Diagnosis not present

## 2020-02-29 ENCOUNTER — Other Ambulatory Visit (HOSPITAL_COMMUNITY): Payer: Self-pay | Admitting: Gastroenterology

## 2020-02-29 LAB — HM HEPATITIS C SCREENING LAB: HM Hepatitis Screen: NEGATIVE

## 2020-03-06 ENCOUNTER — Other Ambulatory Visit (HOSPITAL_COMMUNITY): Payer: Self-pay | Admitting: Gastroenterology

## 2020-03-06 DIAGNOSIS — K5010 Crohn's disease of large intestine without complications: Secondary | ICD-10-CM

## 2020-03-12 ENCOUNTER — Other Ambulatory Visit: Payer: Self-pay | Admitting: Internal Medicine

## 2020-03-20 ENCOUNTER — Ambulatory Visit: Payer: 59

## 2020-03-30 ENCOUNTER — Ambulatory Visit
Admission: RE | Admit: 2020-03-30 | Discharge: 2020-03-30 | Disposition: A | Discharge status: Home / Self Care | Payer: 59 | Source: Ambulatory Visit | Attending: Gastroenterology | Admitting: Gastroenterology

## 2020-03-30 ENCOUNTER — Other Ambulatory Visit: Payer: Self-pay

## 2020-03-30 DIAGNOSIS — K5010 Crohn's disease of large intestine without complications: Secondary | ICD-10-CM | POA: Insufficient documentation

## 2020-03-30 HISTORY — DX: Essential (primary) hypertension: I10

## 2020-03-30 LAB — POCT I-STAT CREATININE: Creatinine, Ser: 0.8 mg/dL (ref 0.44–1.00)

## 2020-03-30 MED ORDER — IOHEXOL 300 MG/ML  SOLN
100.0000 mL | Freq: Once | INTRAMUSCULAR | Status: AC | PRN
  Administered 2020-03-30: 100 mL via INTRAVENOUS

## 2020-04-29 ENCOUNTER — Encounter: Payer: Self-pay | Admitting: Internal Medicine

## 2020-04-30 NOTE — Telephone Encounter (Signed)
I do think it would be a good idea to get pneumovax.  This would be the pneumonia vaccine she would need before 66.  Regarding the cost - I am not sure how much would cost.  She was asking about a f/u appt, ok to schedule f/u appt and can discuss more than as well if other questions.  GI or her insurance may be able to help answer the question about cost.

## 2020-04-30 NOTE — Telephone Encounter (Signed)
Pt  scheduled for virtual tomorrow

## 2020-05-01 ENCOUNTER — Encounter: Payer: Self-pay | Admitting: Internal Medicine

## 2020-05-01 ENCOUNTER — Telehealth (INDEPENDENT_AMBULATORY_CARE_PROVIDER_SITE_OTHER): Payer: 59 | Admitting: Internal Medicine

## 2020-05-01 DIAGNOSIS — K219 Gastro-esophageal reflux disease without esophagitis: Secondary | ICD-10-CM | POA: Diagnosis not present

## 2020-05-01 DIAGNOSIS — G479 Sleep disorder, unspecified: Secondary | ICD-10-CM | POA: Diagnosis not present

## 2020-05-01 DIAGNOSIS — K50919 Crohn's disease, unspecified, with unspecified complications: Secondary | ICD-10-CM

## 2020-05-01 DIAGNOSIS — R0683 Snoring: Secondary | ICD-10-CM

## 2020-05-01 DIAGNOSIS — I10 Essential (primary) hypertension: Secondary | ICD-10-CM

## 2020-05-01 DIAGNOSIS — F419 Anxiety disorder, unspecified: Secondary | ICD-10-CM

## 2020-05-01 NOTE — Progress Notes (Signed)
Patient ID: Lauren Chang, female   DOB: [birth year], 65 y.o.   MRN: [id number]   Virtual Visit via video Note  This visit type was conducted due to national recommendations for restrictions regarding the COVID-19 pandemic (e.g. social distancing).  This format is felt to be most appropriate for this patient at this time.  All issues noted in this document were discussed and addressed.  No physical exam was performed (except for noted visual exam findings with Video Visits).   I connected with [name] by a video enabled telemedicine application and verified that I am speaking with the correct person using two identifiers. Location patient: home Location provider: work Persons participating in the virtual visit: patient, provider  The limitations, risks, security and privacy concerns of performing an evaluation and management service by video and the availability of in person appointments have been discussed.  It has also been discussed with the patient that there may be a patient responsible charge related to this service. The patient expressed understanding and agreed to proceed.   Reason for visit: work in appt  HPI: Work in appt to discuss crohn's - starting [place].   Working.  Increased stress with work.  Discussed.  Does not feel needs any further intervention at this time.  Continues on wellbutrin.  Tries to stay active.  No chest pain or sob reported.  Sees GI for f/u crohn's. Discussed starting humira.  Attempted to answer questions.  No nausea or vomiting.  Bowels moving.  Does report snoring.  Has an APP - reported "epic snoring".  Nasal device helped some.  Discussed further evaluation, including home sleep test.  She is agreeable for referral.  Some dependent edema - compression hose.     ROS: See pertinent positives and negatives per HPI.  Past Medical History:  Diagnosis Date  . Chlamydia   . Crohn's disease ([place])    large intestine  . GERD (gastroesophageal reflux  disease)   . Hypertension     Past Surgical History:  Procedure Laterality Date  . COLONOSCOPY WITH PROPOFOL N/A 01/27/2020   Procedure: COLONOSCOPY WITH PROPOFOL;  Surgeon: Lesly Rubenstein, MD;  Location: [facility] ENDOSCOPY;  Service: Endoscopy;  Laterality: N/A;  . laparoscopy and tuba repair    . orif right calcaneus    . TONSILECTOMY/ADENOIDECTOMY WITH MYRINGOTOMY      Family History  Problem Relation Age of Onset  . Lymphoma Father   . Asthma Mother   . Osteoporosis Mother   . Mental illness Brother        suicide  . Pancreatic cancer Other        uncle  . COPD Maternal Grandmother   . Breast cancer Neg Hx   . Colon cancer Neg Hx     SOCIAL HX: reviewed.    Current Outpatient Medications:  .  budesonide (ENTOCORT EC) 3 MG 24 hr capsule, Take 9 mg by mouth daily. , Disp: , Rfl: 3 .  buPROPion (WELLBUTRIN XL) 150 MG 24 hr tablet, TAKE 1 TABLET BY MOUTH ONCE DAILY., Disp: 90 tablet, Rfl: 0 .  cholecalciferol (VITAMIN D) 400 UNITS TABS tablet, Take 400 Units by mouth 3 (three) times a week., Disp: , Rfl:  .  fluticasone (FLONASE) 50 MCG/ACT nasal spray, SPRAY 2 SPRAYS INTO EACH NOSTRIL ONCE DAILY, Disp: 16 g, Rfl: 11 .  hydrochlorothiazide (MICROZIDE) 12.5 MG capsule, TAKE (1) CAPSULE BY MOUTH ONCE DAILY., Disp: 90 capsule, Rfl: 0 .  LIALDA 1.2 g EC tablet, ,  Disp: , Rfl:  .  pantoprazole (PROTONIX) 40 MG tablet, Take 1 tablet (40 mg total) by mouth daily., Disp: 30 tablet, Rfl: 3 .  valACYclovir (VALTREX) 1000 MG tablet, Use as directed., Disp: 30 tablet, Rfl: 0 .  zolpidem (AMBIEN) 5 MG tablet, TAKE 1/2 TABLET BY MOUTH AT BEDTIME AS NEEDED., Disp: 15 tablet, Rfl: 1  EXAM:  VITALS per patient if applicable: 470 pounds  GENERAL: alert, oriented, appears well and in no acute distress  HEENT: atraumatic, conjunttiva clear, no obvious abnormalities on inspection of external nose and ears  NECK: normal movements of the head and neck  LUNGS: on inspection no signs of  respiratory distress, breathing rate appears normal, no obvious gross SOB, gasping or wheezing  CV: no obvious cyanosis  PSYCH/NEURO: pleasant and cooperative, no obvious depression or anxiety, speech and thought processing grossly intact  ASSESSMENT AND PLAN:  Discussed the following assessment and plan:  Problem List Items Addressed This Visit    Anxiety    Continue wellbutrin.  Stable.       Crohn's disease ([place])    Colonoscopy 01/2020 - pathology - chronic colitis - ascending, transverse.  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  Recommended f/u colonoscopy in 1-2 years.  Discussed humira.  Continue f/u with GI.       GERD (gastroesophageal reflux disease)    No upper symptoms reported.  On protonix.       Hypertension, essential    Continue hctz.  Pressures have been under control.  Follow pressures.  Follow metabolic panel.       Sleeping difficulty    Takes ambien prn.  Discussed snoring.  Discussed possible sleep apnea.  Nasal device helped.  Agreeable to pulmonary referral.       Snoring    Snoring as outlined.  Refer to pulmonary for further evaluation and w/up of possible sleep apnea.        Relevant Orders   Ambulatory referral to Pulmonology       I discussed the assessment and treatment plan with the patient. The patient was provided an opportunity to ask questions and all were answered. The patient agreed with the plan and demonstrated an understanding of the instructions.   The patient was advised to call back or seek an in-person evaluation if the symptoms worsen or if the condition fails to improve as anticipated.   [name], MD

## 2020-05-06 ENCOUNTER — Encounter: Payer: Self-pay | Admitting: Internal Medicine

## 2020-05-06 ENCOUNTER — Telehealth: Payer: Self-pay | Admitting: Internal Medicine

## 2020-05-06 NOTE — Telephone Encounter (Signed)
Schedule physical in 2 months.  Thanks

## 2020-05-06 NOTE — Assessment & Plan Note (Signed)
Snoring as outlined.  Refer to pulmonary for further evaluation and w/up of possible sleep apnea.

## 2020-05-06 NOTE — Assessment & Plan Note (Signed)
Takes ambien prn.  Discussed snoring.  Discussed possible sleep apnea.  Nasal device helped.  Agreeable to pulmonary referral.

## 2020-05-06 NOTE — Assessment & Plan Note (Signed)
Continue hctz.  Pressures have been under control.  Follow pressures.  Follow metabolic panel.

## 2020-05-06 NOTE — Assessment & Plan Note (Signed)
Colonoscopy 01/2020 - pathology - chronic colitis - ascending, transverse.  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  Recommended f/u colonoscopy in 1-2 years.  Discussed humira.  Continue f/u with GI.

## 2020-05-06 NOTE — Assessment & Plan Note (Signed)
Continue wellbutrin.  Stable.

## 2020-05-06 NOTE — Assessment & Plan Note (Signed)
No upper symptoms reported.  On protonix.

## 2020-05-07 MED ORDER — FLUTICASONE PROPIONATE 50 MCG/ACT NA SUSP
NASAL | 2 refills | Status: DC
Start: 2020-05-07 — End: 2024-01-05

## 2020-05-07 NOTE — Telephone Encounter (Signed)
rx sent for flonase.

## 2020-05-07 NOTE — Telephone Encounter (Signed)
Please cancel the referral to pulmonary for evaluation for snoring - question sleep apnea.

## 2020-05-07 NOTE — Addendum Note (Signed)
Addended by: Alisa Graff on: 05/07/2020 07:35 PM   Modules accepted: Orders

## 2020-05-08 NOTE — Telephone Encounter (Signed)
Scheduled

## 2020-05-14 NOTE — Telephone Encounter (Signed)
Good morning!  Referral has been cancelled. Thanks

## 2020-05-20 ENCOUNTER — Encounter: Payer: Self-pay | Admitting: Internal Medicine

## 2020-05-22 NOTE — Telephone Encounter (Signed)
Has she had a pneumonia vaccine previously?  She has a history of Crohn's.

## 2020-05-24 NOTE — Telephone Encounter (Signed)
Per review of her chart, she has a history of Crohn's.  On Lialda now.  Per GI, planning to start Humira.  Can confirm with pt.  This medication can suppress her immune system.  Can provide this information if is correct to see if authorized.

## 2020-05-29 NOTE — Telephone Encounter (Signed)
See attached message.  Need UC information and need to know what f/u UC recommended.

## 2020-05-29 NOTE — Telephone Encounter (Signed)
Pt called she went to UC today and they told her that she needed to call her Doctor.  Pt would like a call back from [facility]

## 2020-05-29 NOTE — Telephone Encounter (Signed)
Spoke with patient. She has contacted her insurance company and they do not cover her getting the prevnar 20 in office or at pharmacy. She is wanting to know if she could get the prevnar 13 or pneumovax. She is not 66 yet. Also patient requested appt with you to follow up on her leg swelling prior to starting the humira. This is something that has been going on. Legs swell bilaterally but go down with elevation. She sits at a desk most days. Has noticed that her lower legs and feet are more tender to the touch but again nothing new. Would just like to address before starting medication. Patient will be out of town 3/23-3/28. I have scheduled her for 3/29 per her request and she has agreed to seek evaluation at acute care if any change in symptoms with her legs.

## 2020-06-01 NOTE — Telephone Encounter (Signed)
Spoke with patient to confirm doing ok. She was seen at [place] and was told to follow up in 2 days so she went to [facility] in [place]. Legs looking better. She is on Keflex. Wearing compression hose. Keeping legs elevated when sitting. She is on vacation until next week. Sees [place] on 3/29. Requested records from urgent care.

## 2020-06-05 ENCOUNTER — Other Ambulatory Visit: Payer: Self-pay

## 2020-06-05 ENCOUNTER — Ambulatory Visit: Payer: 59 | Admitting: Internal Medicine

## 2020-06-05 ENCOUNTER — Encounter: Payer: Self-pay | Admitting: Internal Medicine

## 2020-06-05 DIAGNOSIS — M7989 Other specified soft tissue disorders: Secondary | ICD-10-CM

## 2020-06-05 DIAGNOSIS — I10 Essential (primary) hypertension: Secondary | ICD-10-CM | POA: Diagnosis not present

## 2020-06-05 DIAGNOSIS — K219 Gastro-esophageal reflux disease without esophagitis: Secondary | ICD-10-CM

## 2020-06-05 NOTE — Progress Notes (Signed)
Patient ID: Lauren Chang, female   DOB: 10-Apr-1955, 65 y.o.   MRN: [id number]   Subjective:    Patient ID: Lauren Chang, female    DOB: 08/31/1955, 65 y.o.   MRN: [id number]  HPI This visit occurred during the SARS-CoV-2 public health emergency.  Safety protocols were in place, including screening questions prior to the visit, additional usage of staff PPE, and extensive cleaning of exam room while observing appropriate contact time as indicated for disinfecting solutions.  Patient here for work in appt.  Work in for f/u leg swelling and cellulitis.  Was seen urgent care 05/29/20 - diagnosed with cellulitis.  Placed on keflex.  05/31/20 - f/u - continue keflex.  Redness better.  Continues to have issues with swelling.  Wears hose.  AM better.  No chest pain or sob.  Eating.  No abdominal pain.  Bowels stable.   Past Medical History:  Diagnosis Date  . Chlamydia   . Crohn's disease ([place])    large intestine  . GERD (gastroesophageal reflux disease)   . Hypertension    Past Surgical History:  Procedure Laterality Date  . COLONOSCOPY WITH PROPOFOL N/A 01/27/2020   Procedure: COLONOSCOPY WITH PROPOFOL;  Surgeon: Lesly Rubenstein, MD;  Location: [facility] ENDOSCOPY;  Service: Endoscopy;  Laterality: N/A;  . laparoscopy and tuba repair    . orif right calcaneus    . TONSILECTOMY/ADENOIDECTOMY WITH MYRINGOTOMY     Family History  Problem Relation Age of Onset  . Lymphoma Father   . Asthma Mother   . Osteoporosis Mother   . Mental illness Brother        suicide  . Pancreatic cancer Other        uncle  . COPD Maternal Grandmother   . Breast cancer Neg Hx   . Colon cancer Neg Hx    Social History   Socioeconomic History  . Marital status: Widowed    Spouse name: Not on file  . Number of children: 0  . Years of education: Not on file  . Highest education level: Not on file  Occupational History  . Not on file  Tobacco Use  . Smoking status: Former Smoker    Quit date:  03/11/1999    Years since quitting: 21.2  . Smokeless tobacco: Never Used  Vaping Use  . Vaping Use: Never used  Substance and Sexual Activity  . Alcohol use: No    Alcohol/week: 0.0 standard drinks  . Drug use: No  . Sexual activity: Not on file  Other Topics Concern  . Not on file  Social History Narrative  . Not on file   Social Determinants of Health   Financial Resource Strain: Not on file  Food Insecurity: Not on file  Transportation Needs: Not on file  Physical Activity: Not on file  Stress: Not on file  Social Connections: Not on file    Outpatient Encounter Medications as of 06/05/2020  Medication Sig  . B Complex Vitamins (B COMPLEX 50) TABS Take 1 capsule by mouth daily.  Marland Kitchen buPROPion (WELLBUTRIN XL) 150 MG 24 hr tablet TAKE 1 TABLET BY MOUTH ONCE DAILY.  . cephALEXin (KEFLEX) 500 MG capsule Take 500 mg by mouth 4 (four) times daily.  . cholecalciferol (VITAMIN D) 400 UNITS TABS tablet Take 400 Units by mouth 3 (three) times a week.  . fluticasone (FLONASE) 50 MCG/ACT nasal spray 2 sprays each nostril one time per day (in pm).  . hydrochlorothiazide (MICROZIDE) 12.5 MG capsule TAKE (  1) CAPSULE BY MOUTH ONCE DAILY.  Marland Kitchen LIALDA 1.2 g EC tablet   . Multiple Vitamins-Minerals (ALGAE BASED CALCIUM) TABS Take 1 tablet by mouth daily.  . pantoprazole (PROTONIX) 40 MG tablet Take 1 tablet (40 mg total) by mouth daily.  . valACYclovir (VALTREX) 1000 MG tablet Use as directed.  . zolpidem (AMBIEN) 5 MG tablet TAKE 1/2 TABLET BY MOUTH AT BEDTIME AS NEEDED.  . Adalimumab (HUMIRA) 40 MG/0.4ML PSKT Day 1 inject 2 pens SQ, Day 15 inject 1 pen SQ & Day 29 inject 1 pen SQ (Patient not taking: Reported on 06/05/2020)  . HUMIRA PEN-CD/UC/HS STARTER 80 MG/0.8ML PNKT Inject into the skin. (Patient not taking: Reported on 06/05/2020)   No facility-administered encounter medications on file as of 06/05/2020.    Review of Systems  Constitutional: Negative for appetite change and unexpected  weight change.  HENT: Negative for congestion and sinus pressure.   Respiratory: Negative for cough, chest tightness and shortness of breath.   Cardiovascular: Positive for leg swelling. Negative for chest pain and palpitations.  Gastrointestinal: Negative for abdominal pain, diarrhea, nausea and vomiting.  Genitourinary: Negative for difficulty urinating and dysuria.  Musculoskeletal: Negative for joint swelling and myalgias.  Skin: Negative for color change and rash.  Neurological: Negative for dizziness, light-headedness and headaches.  Psychiatric/Behavioral: Negative for agitation and dysphoric mood.       Objective:    Physical Exam Vitals reviewed.  Constitutional:      General: She is not in acute distress. HENT:     Head: Normocephalic and atraumatic.     Right Ear: External ear normal.     Left Ear: External ear normal.  Eyes:     General: No scleral icterus.       Right eye: No discharge.        Left eye: No discharge.  Neck:     Thyroid: No thyromegaly.  Cardiovascular:     Rate and Rhythm: Normal rate and regular rhythm.  Pulmonary:     Effort: No respiratory distress.     Breath sounds: Normal breath sounds. No wheezing.  Abdominal:     General: Bowel sounds are normal.     Palpations: Abdomen is soft.     Tenderness: There is no abdominal tenderness.  Musculoskeletal:        General: No tenderness.     Cervical back: Neck supple. No tenderness.     Comments: Lower extremity swelling.   Lymphadenopathy:     Cervical: No cervical adenopathy.  Skin:    Findings: No erythema or rash.  Neurological:     Mental Status: She is alert.  Psychiatric:        Mood and Affect: Mood normal.        Behavior: Behavior normal.     BP 110/76 (BP Location: Left Arm, Patient Position: Sitting, Cuff Size: Normal)   Pulse 87   Temp (!) 97 F (36.1 C)   Ht 5' 5"  (1.651 m)   Wt 161 lb 12.8 oz (73.4 kg)   LMP 04/12/2010   SpO2 97%   BMI 26.92 kg/m  Wt Readings  from Last 3 Encounters:  06/05/20 161 lb 12.8 oz (73.4 kg)  05/01/20 163 lb (73.9 kg)  01/27/20 160 lb (72.6 kg)     Lab Results  Component Value Date   WBC 9.0 12/30/2018   HGB 13.9 12/30/2018   HCT 42.9 12/30/2018   PLT 290.0 12/30/2018   GLUCOSE 86 05/26/2019   CHOL 179  05/26/2019   TRIG 92 05/26/2019   HDL 63 05/26/2019   LDLCALC 99 05/26/2019   ALT 17 05/26/2019   AST 16 05/26/2019   NA 138 05/26/2019   K 3.7 05/26/2019   CL 97 05/26/2019   CREATININE 0.80 03/30/2020   BUN 21 05/26/2019   CO2 22 05/26/2019   TSH 2.61 12/30/2018    CT ENTERO ABD/PELVIS W CONTAST  Result Date: 03/30/2020 CLINICAL DATA:  Follow-up Crohn's disease. EXAM: CT ABDOMEN AND PELVIS WITH CONTRAST (ENTEROGRAPHY) TECHNIQUE: Multidetector CT of the abdomen and pelvis during bolus administration of intravenous contrast. Negative oral contrast was given. CONTRAST:  11m OMNIPAQUE IOHEXOL 300 MG/ML  SOLN COMPARISON:  12/14/2015 FINDINGS: Lower chest: The lung bases are clear of an acute process. No pleural effusions or pulmonary lesions. The heart is normal in size. There is a small hiatal hernia noted. Hepatobiliary: Diffuse fatty infiltration of the liver is noted. No focal hepatic lesions or intrahepatic biliary dilatation. The gallbladder is unremarkable. No common bile duct dilatation. Pancreas: No mass, inflammation or ductal dilatation. Spleen: Normal size.  No focal lesions. Adrenals/Urinary Tract: The adrenal glands and kidneys are unremarkable. The bladder is normal. Stomach/Bowel: The stomach is well distended. No mass or inflammatory process. The duodenum and small bowel are unremarkable. No findings for obstruction or inflammation. The terminal ileum is normal. No evidence of active Crohn's disease. The colon is unremarkable. No colonic inflammatory process. Moderate diffuse colonic diverticulosis without findings for acute diverticulitis. The appendix is normal. Vascular/Lymphatic: The aorta is  normal in caliber. No dissection. The branch vessels are patent. The major venous structures are patent. No mesenteric or retroperitoneal mass or adenopathy. Small scattered lymph nodes are noted. Reproductive: The uterus and ovaries are unremarkable. Other: No pelvic mass or adenopathy. No free pelvic fluid collections. No inguinal mass or adenopathy. No abdominal wall hernia or subcutaneous lesions. Musculoskeletal: No significant bony findings. IMPRESSION: 1. No CT findings for active Crohn's disease. 2. No acute abdominal/pelvic findings, mass lesions or adenopathy. 3. Diffuse fatty infiltration of the liver. 4. Colonic diverticulosis without findings for diverticulitis. 5. Small hiatal hernia. Electronically Signed   By: PMarijo SanesM.D.   On: 03/30/2020 13:57       Assessment & Plan:   Problem List Items Addressed This Visit    GERD (gastroesophageal reflux disease)    No upper symptoms reported.  On protonix.       Hypertension, essential    Continue hctz.  Blood pressure doing well.  Follow pressures.  Follow metabolic panel.       Swelling of lower extremity    Has tried compression hose.  Discussed leg elevation.  Refer to vascular surgery for further evaluation and treatment.  On hctz.        Relevant Orders   Ambulatory referral to Vascular Surgery       CEinar Pheasant MD

## 2020-06-10 ENCOUNTER — Other Ambulatory Visit: Payer: Self-pay | Admitting: Internal Medicine

## 2020-06-11 ENCOUNTER — Encounter: Payer: Self-pay | Admitting: Internal Medicine

## 2020-06-11 ENCOUNTER — Other Ambulatory Visit: Payer: Self-pay | Admitting: Internal Medicine

## 2020-06-11 DIAGNOSIS — M7989 Other specified soft tissue disorders: Secondary | ICD-10-CM | POA: Insufficient documentation

## 2020-06-11 NOTE — Assessment & Plan Note (Signed)
Continue hctz.  Blood pressure doing well.  Follow pressures.  Follow metabolic panel.

## 2020-06-11 NOTE — Assessment & Plan Note (Signed)
No upper symptoms reported.  On protonix.

## 2020-06-11 NOTE — Assessment & Plan Note (Signed)
Has tried compression hose.  Discussed leg elevation.  Refer to vascular surgery for further evaluation and treatment.  On hctz.

## 2020-06-27 ENCOUNTER — Encounter (INDEPENDENT_AMBULATORY_CARE_PROVIDER_SITE_OTHER): Payer: Self-pay | Admitting: Nurse Practitioner

## 2020-06-27 ENCOUNTER — Ambulatory Visit (INDEPENDENT_AMBULATORY_CARE_PROVIDER_SITE_OTHER): Payer: 59 | Admitting: Nurse Practitioner

## 2020-06-27 ENCOUNTER — Other Ambulatory Visit: Payer: Self-pay

## 2020-06-27 VITALS — BP 145/86 | HR 88 | Resp 16 | Ht 65.5 in | Wt 160.8 lb

## 2020-06-27 DIAGNOSIS — M7989 Other specified soft tissue disorders: Secondary | ICD-10-CM

## 2020-06-27 DIAGNOSIS — I10 Essential (primary) hypertension: Secondary | ICD-10-CM | POA: Diagnosis not present

## 2020-07-02 ENCOUNTER — Encounter (INDEPENDENT_AMBULATORY_CARE_PROVIDER_SITE_OTHER): Payer: Self-pay | Admitting: Nurse Practitioner

## 2020-07-02 NOTE — Progress Notes (Signed)
Subjective:    Patient ID: Lauren Chang, female    DOB: [birth year], 65 y.o.   MRN: [id number] Chief Complaint  Patient presents with  . New Patient (Initial Visit)    Ref Scott le swelling    Patient is seen for evaluation of leg pain and leg swelling. The patient first noticed the swelling remotely. The swelling is associated with pain. The pain and swelling worsens with prolonged dependency and improves with elevation. The pain is unrelated to activity.  The patient notes that in the morning the legs are significantly improved but they steadily worsened throughout the course of the day. The patient also notes a steady worsening of the discoloration in the ankle and shin area.   The patient denies claudication symptoms.  The patient denies symptoms consistent with rest pain.  The patient denies and extensive history of DJD and LS spine disease.  The patient has no had any past angiography, interventions or vascular surgery.  Elevation makes the leg symptoms better, dependency makes them much worse. There is no history of ulcerations. The patient denies any recent changes in medications.  The patient has been wearing graduated compression.  The patient denies a history of DVT or PE. There is no prior history of phlebitis. There is no history of primary lymphedema.  No history of malignancies. No history of trauma or groin or pelvic surgery. There is no history of radiation treatment to the groin or pelvis  The patient denies amaurosis fugax or recent TIA symptoms. There are no recent neurological changes noted. The patient denies recent episodes of angina or shortness of breath   Review of Systems  Cardiovascular: Positive for leg swelling.  All other systems reviewed and are negative.      Objective:   Physical Exam Vitals reviewed.  HENT:     Head: Normocephalic.  Cardiovascular:     Rate and Rhythm: Normal rate.     Pulses:          Dorsalis pedis pulses are 1+ on  the right side and 1+ on the left side.       Posterior tibial pulses are 1+ on the right side and 1+ on the left side.  Pulmonary:     Effort: Pulmonary effort is normal.  Musculoskeletal:     Right lower leg: Edema present.     Left lower leg: Edema present.  Neurological:     Mental Status: She is alert and oriented to person, place, and time.  Psychiatric:        Mood and Affect: Mood normal.        Behavior: Behavior normal.        Thought Content: Thought content normal.        Judgment: Judgment normal.     BP (!) 145/86 (BP Location: Left Arm)   Pulse 88   Resp 16   Ht 5' 5.5" (1.664 m)   Wt 160 lb 12.8 oz (72.9 kg)   LMP 04/12/2010   BMI 26.35 kg/m   Past Medical History:  Diagnosis Date  . Chlamydia   . Crohn's disease ([place])    large intestine  . GERD (gastroesophageal reflux disease)   . Hypertension     Social History   Socioeconomic History  . Marital status: Widowed    Spouse name: Not on file  . Number of children: 0  . Years of education: Not on file  . Highest education level: Not on file  Occupational History  .  Not on file  Tobacco Use  . Smoking status: Former Smoker    Quit date: 03/11/1999    Years since quitting: 21.3  . Smokeless tobacco: Never Used  Vaping Use  . Vaping Use: Never used  Substance and Sexual Activity  . Alcohol use: No    Alcohol/week: 0.0 standard drinks  . Drug use: No  . Sexual activity: Not on file  Other Topics Concern  . Not on file  Social History Narrative  . Not on file   Social Determinants of Health   Financial Resource Strain: Not on file  Food Insecurity: Not on file  Transportation Needs: Not on file  Physical Activity: Not on file  Stress: Not on file  Social Connections: Not on file  Intimate Partner Violence: Not on file    Past Surgical History:  Procedure Laterality Date  . COLONOSCOPY WITH PROPOFOL N/A 01/27/2020   Procedure: COLONOSCOPY WITH PROPOFOL;  Surgeon: Lesly Rubenstein,  MD;  Location: [facility] ENDOSCOPY;  Service: Endoscopy;  Laterality: N/A;  . laparoscopy and tuba repair    . orif right calcaneus    . TONSILECTOMY/ADENOIDECTOMY WITH MYRINGOTOMY      Family History  Problem Relation Age of Onset  . Lymphoma Father   . Asthma Mother   . Osteoporosis Mother   . Mental illness Brother        suicide  . Pancreatic cancer Other        uncle  . COPD Maternal Grandmother   . Breast cancer Neg Hx   . Colon cancer Neg Hx     Allergies  Allergen Reactions  . Bactrim [Sulfamethoxazole-Trimethoprim] Nausea Only  . Ciprofloxacin Other (See Comments)    Arthralgias   . Ibuprofen Rash  . Oxycodone Rash  . Oxycontin [Oxycodone Hcl] Rash    CBC Latest Ref Rng & Units 12/30/2018 09/26/2016 06/26/2016  WBC 4.0 - 10.5 K/uL 9.0 9.8 10.4  Hemoglobin 12.0 - 15.0 g/dL 13.9 13.7 12.9  Hematocrit 36.0 - 46.0 % 42.9 41.8 39.0  Platelets 150.0 - 400.0 K/uL 290.0 304.0 305      CMP     Component Value Date/Time   NA 138 05/26/2019 0840   K 3.7 05/26/2019 0840   CL 97 05/26/2019 0840   CO2 22 05/26/2019 0840   GLUCOSE 86 05/26/2019 0840   GLUCOSE 99 12/30/2018 0917   BUN 21 05/26/2019 0840   CREATININE 0.80 03/30/2020 1330   CALCIUM 9.6 05/26/2019 0840   PROT 6.6 05/26/2019 0840   ALBUMIN 4.4 05/26/2019 0840   AST 16 05/26/2019 0840   ALT 17 05/26/2019 0840   ALKPHOS 76 05/26/2019 0840   BILITOT 0.3 05/26/2019 0840   GFRNONAA 76 05/26/2019 0840   GFRAA 87 05/26/2019 0840     No results found.     Assessment & Plan:   1. Swelling of lower extremity I have had a long discussion with the patient regarding swelling and why it  causes symptoms.  Patient will begin wearing graduated compression stockings class 1 (20-30 mmHg) on a daily basis a prescription was given. The patient will  beginning wearing the stockings first thing in the morning and removing them in the evening. The patient is instructed specifically not to sleep in the stockings.   In  addition, behavioral modification will be initiated.  This will include frequent elevation, use of over the counter pain medications and exercise such as walking.  I have reviewed systemic causes for chronic edema such as liver,  kidney and cardiac etiologies.  The patient denies problems with these organ systems.    Consideration for a lymph pump will also be made based upon the effectiveness of conservative therapy.  This would help to improve the edema control and prevent sequela such as ulcers and infections   Patient should undergo duplex ultrasound of the venous system to ensure that DVT or reflux is not present.  Additionally, the patient should not have ABIs done as well due to lower extremity discomfort.  The patient will follow-up with me after the ultrasound.   - VAS [place] ABI WITH/WO TBI; Future - VAS [place] LOWER EXTREMITY VENOUS REFLUX; Future  2. Hypertension, essential Continue antihypertensive medications as already ordered, these medications have been reviewed and there are no changes at this time.    Current Outpatient Medications on File Prior to Visit  Medication Sig Dispense Refill  . Adalimumab (HUMIRA) 40 MG/0.4ML PSKT Day 1 inject 2 pens SQ, Day 15 inject 1 pen SQ & Day 29 inject 1 pen SQ    . B Complex Vitamins (B COMPLEX 50) TABS Take 1 capsule by mouth daily.    Marland Kitchen buPROPion (WELLBUTRIN XL) 150 MG 24 hr tablet TAKE 1 TABLET BY MOUTH ONCE DAILY. 30 tablet 0  . cholecalciferol (VITAMIN D) 400 UNITS TABS tablet Take 400 Units by mouth 3 (three) times a week.    . fluticasone (FLONASE) 50 MCG/ACT nasal spray 2 sprays each nostril one time per day (in pm). 16 g 2  . hydrochlorothiazide (MICROZIDE) 12.5 MG capsule TAKE (1) CAPSULE BY MOUTH ONCE DAILY. 30 capsule 0  . LIALDA 1.2 g EC tablet     . Multiple Vitamins-Minerals (ALGAE BASED CALCIUM) TABS Take 1 tablet by mouth daily.    . pantoprazole (PROTONIX) 40 MG tablet Take 1 tablet (40 mg total) by mouth daily. 30 tablet 3   . valACYclovir (VALTREX) 1000 MG tablet Use as directed. 30 tablet 0  . zolpidem (AMBIEN) 5 MG tablet TAKE 1/2 TABLET BY MOUTH AT BEDTIME AS NEEDED. 15 tablet 1  . cephALEXin (KEFLEX) 500 MG capsule Take 500 mg by mouth 4 (four) times daily.    Marland Kitchen HUMIRA PEN-CD/UC/HS STARTER 80 MG/0.8ML PNKT Inject into the skin. (Patient not taking: No sig reported)     No current facility-administered medications on file prior to visit.    There are no Patient Instructions on file for this visit. No follow-ups on file.   Kris Hartmann, NP

## 2020-07-11 ENCOUNTER — Ambulatory Visit (INDEPENDENT_AMBULATORY_CARE_PROVIDER_SITE_OTHER): Payer: 59 | Admitting: Nurse Practitioner

## 2020-07-11 ENCOUNTER — Ambulatory Visit (INDEPENDENT_AMBULATORY_CARE_PROVIDER_SITE_OTHER): Payer: 59

## 2020-07-11 ENCOUNTER — Other Ambulatory Visit: Payer: Self-pay

## 2020-07-11 VITALS — BP 132/82 | HR 79 | Ht 65.0 in | Wt 159.0 lb

## 2020-07-11 DIAGNOSIS — R600 Localized edema: Secondary | ICD-10-CM

## 2020-07-11 DIAGNOSIS — I890 Lymphedema, not elsewhere classified: Secondary | ICD-10-CM | POA: Diagnosis not present

## 2020-07-11 DIAGNOSIS — M7989 Other specified soft tissue disorders: Secondary | ICD-10-CM

## 2020-07-11 DIAGNOSIS — I10 Essential (primary) hypertension: Secondary | ICD-10-CM | POA: Diagnosis not present

## 2020-07-13 ENCOUNTER — Other Ambulatory Visit: Payer: Self-pay | Admitting: Internal Medicine

## 2020-07-16 ENCOUNTER — Encounter (INDEPENDENT_AMBULATORY_CARE_PROVIDER_SITE_OTHER): Payer: Self-pay | Admitting: Nurse Practitioner

## 2020-07-16 NOTE — Progress Notes (Signed)
Subjective:    Patient ID: Lauren Chang, female    DOB: 1956/01/29, 66 y.o.   MRN: [id number] Chief Complaint  Patient presents with  . Follow-up    Pt conv. BLE ven reflux bil abi     Lauren Chang is a 65 year old female that returns today for evaluation of lower extremity edema.  The patient does have several inflammatory factors that increase the possibility of edema including Crohn's disease.  She recently started on Humira with hope that this will decrease some of the systemic inflammation.  She does have pitting edema in her bilateral lower extremities.  To her knowledge she has no dysfunction within her cardiac, renal or hepatic systems however she does have an upcoming cardiac evaluation.  The patient works for long hours sitting but she does utilize medical grade compression on a daily basis as she has utilized them for years.  She also elevates her lower extremities as much as possible when she is able.  Patient is also active.  She denies any lower wounds or ulcerations.  She denies any fever or chills.  Despite doing all conservative measures that have failed to assist drastic improvement of her edema.  Today noninvasive studies help no evidence of DVT or superficial thrombophlebitis seen bilaterally.  No evidence of deep venous insufficiency or superficial venous reflux seen bilaterally.  Due to some of the pain the patient was having in her lower extremities as well as diminished pulses we also performed bilateral lower extremity ABIs.  The right has an ABI 1.22 with the left being 1.21.  The right TBI is 1.00 with a left of 1.09.  Patient has triphasic tibial artery waveforms bilaterally with good toe waveforms bilaterally.   Review of Systems  Cardiovascular: Positive for leg swelling.  Musculoskeletal: Positive for joint swelling.  All other systems reviewed and are negative.      Objective:   Physical Exam Vitals reviewed.  HENT:     Head: Normocephalic.   Cardiovascular:     Rate and Rhythm: Normal rate.     Pulses: Decreased pulses.  Pulmonary:     Effort: Pulmonary effort is normal.  Musculoskeletal:     Right lower leg: 2+ Edema present.     Left lower leg: 2+ Edema present.  Skin:    General: Skin is warm and dry.     Comments: Dermal thickening bilaterally   Neurological:     Mental Status: She is alert and oriented to person, place, and time.  Psychiatric:        Mood and Affect: Mood normal.        Behavior: Behavior normal.        Thought Content: Thought content normal.        Judgment: Judgment normal.     BP 132/82   Pulse 79   Ht 5' 5"  (1.651 m)   Wt 159 lb (72.1 kg)   LMP 04/12/2010   BMI 26.46 kg/m   Past Medical History:  Diagnosis Date  . Chlamydia   . Crohn's disease ([place])    large intestine  . GERD (gastroesophageal reflux disease)   . Hypertension     Social History   Socioeconomic History  . Marital status: Widowed    Spouse name: Not on file  . Number of children: 0  . Years of education: Not on file  . Highest education level: Not on file  Occupational History  . Not on file  Tobacco Use  . Smoking  status: Former Smoker    Quit date: 03/11/1999    Years since quitting: 21.3  . Smokeless tobacco: Never Used  Vaping Use  . Vaping Use: Never used  Substance and Sexual Activity  . Alcohol use: No    Alcohol/week: 0.0 standard drinks  . Drug use: No  . Sexual activity: Not on file  Other Topics Concern  . Not on file  Social History Narrative  . Not on file   Social Determinants of Health   Financial Resource Strain: Not on file  Food Insecurity: Not on file  Transportation Needs: Not on file  Physical Activity: Not on file  Stress: Not on file  Social Connections: Not on file  Intimate Partner Violence: Not on file    Past Surgical History:  Procedure Laterality Date  . COLONOSCOPY WITH PROPOFOL N/A 01/27/2020   Procedure: COLONOSCOPY WITH PROPOFOL;  Surgeon: Lesly Rubenstein, MD;  Location: [facility] ENDOSCOPY;  Service: Endoscopy;  Laterality: N/A;  . laparoscopy and tuba repair    . orif right calcaneus    . TONSILECTOMY/ADENOIDECTOMY WITH MYRINGOTOMY      Family History  Problem Relation Age of Onset  . Lymphoma Father   . Asthma Mother   . Osteoporosis Mother   . Mental illness Brother        suicide  . Pancreatic cancer Other        uncle  . COPD Maternal Grandmother   . Breast cancer Neg Hx   . Colon cancer Neg Hx     Allergies  Allergen Reactions  . Bactrim [Sulfamethoxazole-Trimethoprim] Nausea Only  . Ciprofloxacin Other (See Comments)    Arthralgias   . Ibuprofen Rash  . Oxycodone Rash  . Oxycontin [Oxycodone Hcl] Rash    CBC Latest Ref Rng & Units 12/30/2018 09/26/2016 06/26/2016  WBC 4.0 - 10.5 K/uL 9.0 9.8 10.4  Hemoglobin 12.0 - 15.0 g/dL 13.9 13.7 12.9  Hematocrit 36.0 - 46.0 % 42.9 41.8 39.0  Platelets 150.0 - 400.0 K/uL 290.0 304.0 305      CMP     Component Value Date/Time   NA 138 05/26/2019 0840   K 3.7 05/26/2019 0840   CL 97 05/26/2019 0840   CO2 22 05/26/2019 0840   GLUCOSE 86 05/26/2019 0840   GLUCOSE 99 12/30/2018 0917   BUN 21 05/26/2019 0840   CREATININE 0.80 03/30/2020 1330   CALCIUM 9.6 05/26/2019 0840   PROT 6.6 05/26/2019 0840   ALBUMIN 4.4 05/26/2019 0840   AST 16 05/26/2019 0840   ALT 17 05/26/2019 0840   ALKPHOS 76 05/26/2019 0840   BILITOT 0.3 05/26/2019 0840   GFRNONAA 76 05/26/2019 0840   GFRAA 87 05/26/2019 0840     VAS [place] ABI WITH/WO TBI  Result Date: 07/12/2020  LOWER EXTREMITY DOPPLER STUDY Patient Name:  Lauren Chang  Date of Exam:   07/11/2020 Medical Rec #: [id number]          Accession #:    [id number] Date of Birth: 11/03/1955          Patient Gender: F Patient Age:   66Y Exam Location:  [facility] Procedure:      VAS [place] ABI WITH/WO TBI Referring Phys: [id number] [place] --------------------------------------------------------------------------------   Indications: Rest pain.  Performing Technologist: Almira Coaster RVS  Examination Guidelines: A complete evaluation includes at minimum, Doppler waveform signals and systolic blood pressure reading at the level of bilateral brachial, anterior tibial, and posterior tibial arteries, when  vessel segments are accessible. Bilateral testing is considered an integral part of a complete examination. Photoelectric Plethysmograph (PPG) waveforms and toe systolic pressure readings are included as required and additional duplex testing as needed. Limited examinations for reoccurring indications may be performed as noted.  ABI Findings: +---------+------------------+-----+---------+--------+ Right    Rt Pressure (mmHg)IndexWaveform Comment  +---------+------------------+-----+---------+--------+ Brachial 135                                      +---------+------------------+-----+---------+--------+ ATA      160               1.19 triphasic         +---------+------------------+-----+---------+--------+ PTA      165               1.22 triphasic         +---------+------------------+-----+---------+--------+ Great Toe135               1.00 Normal            +---------+------------------+-----+---------+--------+ +---------+------------------+-----+---------+-------+ Left     Lt Pressure (mmHg)IndexWaveform Comment +---------+------------------+-----+---------+-------+ ATA      157               1.16 triphasic        +---------+------------------+-----+---------+-------+ PTA      164               1.21 triphasic        +---------+------------------+-----+---------+-------+ Great Toe147               1.09 Normal           +---------+------------------+-----+---------+-------+ +-------+-----------+-----------+------------+------------+ ABI/TBIToday's ABIToday's TBIPrevious ABIPrevious TBI +-------+-----------+-----------+------------+------------+ Right  1.22       1.00                                 +-------+-----------+-----------+------------+------------+ Left   1.21       1.09                                +-------+-----------+-----------+------------+------------+  Summary: Right: Resting right ankle-brachial index is within normal range. No evidence of significant right lower extremity arterial disease. The right toe-brachial index is normal. Left: Resting left ankle-brachial index is within normal range. No evidence of significant left lower extremity arterial disease. The left toe-brachial index is normal.  *See table(s) above for measurements and observations.  Electronically signed by Hortencia Pilar MD on 07/12/2020 at 5:10:27 PM.    Final        Assessment & Plan:   1. Lymphedema Recommend:  No surgery or intervention at this point in time.    I have reviewed my previous discussion with the patient regarding swelling and why it causes symptoms.  Patient will continue wearing graduated compression stockings class 1 (20-30 mmHg) on a daily basis. The patient will  beginning wearing the stockings first thing in the morning and removing them in the evening. The patient is instructed specifically not to sleep in the stockings.    In addition, behavioral modification including several periods of elevation of the lower extremities during the day will be continued.  This was reviewed with the patient during the initial visit.  The patient will also continue routine exercise, especially walking on a daily basis as was  discussed during the initial visit.    Despite conservative treatments of well over four weeks,  including graduated compression therapy class 1 and behavioral modification including exercise and elevation the patient  has not obtained adequate control of the lymphedema.  The patient still has stage 3 lymphedema and therefore, I believe that a lymph pump should be added to improve the control of the patient's lymphedema.  Additionally, a  lymph pump is warranted because it will reduce the risk of cellulitis and ulceration in the future.  Patient should follow-up in six months    2. Hypertension, essential Continue antihypertensive medications as already ordered, these medications have been reviewed and there are no changes at this time.    Current Outpatient Medications on File Prior to Visit  Medication Sig Dispense Refill  . Adalimumab (HUMIRA) 40 MG/0.4ML PSKT Day 1 inject 2 pens SQ, Day 15 inject 1 pen SQ & Day 29 inject 1 pen SQ    . B Complex Vitamins (B COMPLEX 50) TABS Take 1 capsule by mouth daily.    . cholecalciferol (VITAMIN D) 400 UNITS TABS tablet Take 400 Units by mouth 3 (three) times a week.    . fluticasone (FLONASE) 50 MCG/ACT nasal spray 2 sprays each nostril one time per day (in pm). 16 g 2  . HUMIRA PEN-CD/UC/HS STARTER 80 MG/0.8ML PNKT Inject into the skin.    Marland Kitchen LIALDA 1.2 g EC tablet     . Multiple Vitamins-Minerals (ALGAE BASED CALCIUM) TABS Take 1 tablet by mouth daily.    . pantoprazole (PROTONIX) 40 MG tablet Take 1 tablet (40 mg total) by mouth daily. 30 tablet 3  . valACYclovir (VALTREX) 1000 MG tablet Use as directed. 30 tablet 0  . zolpidem (AMBIEN) 5 MG tablet TAKE 1/2 TABLET BY MOUTH AT BEDTIME AS NEEDED. 15 tablet 1  . cephALEXin (KEFLEX) 500 MG capsule Take 500 mg by mouth 4 (four) times daily.     No current facility-administered medications on file prior to visit.    There are no Patient Instructions on file for this visit. No follow-ups on file.   Kris Hartmann, NP

## 2020-07-27 ENCOUNTER — Encounter: Payer: Self-pay | Admitting: Internal Medicine

## 2020-07-27 NOTE — Telephone Encounter (Signed)
I do not mind ordering labs - may need appt to better know what needs to be drawn.

## 2020-07-27 NOTE — Telephone Encounter (Signed)
Do I need to set her up for an appt with you or have her come in for labs?

## 2020-08-07 ENCOUNTER — Encounter: Payer: Self-pay | Admitting: Internal Medicine

## 2020-08-07 ENCOUNTER — Ambulatory Visit (INDEPENDENT_AMBULATORY_CARE_PROVIDER_SITE_OTHER): Payer: 59 | Admitting: Internal Medicine

## 2020-08-07 ENCOUNTER — Other Ambulatory Visit: Payer: Self-pay

## 2020-08-07 VITALS — BP 122/70 | HR 98 | Temp 97.6°F | Resp 16 | Ht 65.0 in | Wt 156.4 lb

## 2020-08-07 DIAGNOSIS — K50919 Crohn's disease, unspecified, with unspecified complications: Secondary | ICD-10-CM

## 2020-08-07 DIAGNOSIS — I10 Essential (primary) hypertension: Secondary | ICD-10-CM

## 2020-08-07 DIAGNOSIS — K219 Gastro-esophageal reflux disease without esophagitis: Secondary | ICD-10-CM | POA: Diagnosis not present

## 2020-08-07 DIAGNOSIS — M2540 Effusion, unspecified joint: Secondary | ICD-10-CM

## 2020-08-07 DIAGNOSIS — M7989 Other specified soft tissue disorders: Secondary | ICD-10-CM

## 2020-08-07 DIAGNOSIS — F419 Anxiety disorder, unspecified: Secondary | ICD-10-CM

## 2020-08-07 NOTE — Progress Notes (Signed)
Patient ID: Lauren Chang, female   DOB: 1955-10-22, 65 y.o.   MRN: [id number]   Subjective:    Patient ID: Lauren Chang, female    DOB: Jul 31, 1955, 65 y.o.   MRN: [id number]  HPI This visit occurred during the SARS-CoV-2 public health emergency.  Safety protocols were in place, including screening questions prior to the visit, additional usage of staff PPE, and extensive cleaning of exam room while observing appropriate contact time as indicated for disinfecting solutions.  Patient here for a scheduled follow up.  Here to follow up regarding lymphedema, hypertension and Crohn's.  Having left thumb pain and right wrist pain.  Discussed referral to rheumatology with GI.  No chest pain or sob reported.  Eating.  No nausea or vomiting. Bowels moving.  Has been wearing compression hose, leg elevation.  Discussed lymphedema pump.  Saw [facility].    Past Medical History:  Diagnosis Date  . Chlamydia   . Crohn's disease ([place])    large intestine  . GERD (gastroesophageal reflux disease)   . Hypertension    Past Surgical History:  Procedure Laterality Date  . COLONOSCOPY WITH PROPOFOL N/A 01/27/2020   Procedure: COLONOSCOPY WITH PROPOFOL;  Surgeon: Lesly Rubenstein, MD;  Location: [facility] ENDOSCOPY;  Service: Endoscopy;  Laterality: N/A;  . laparoscopy and tuba repair    . orif right calcaneus    . TONSILECTOMY/ADENOIDECTOMY WITH MYRINGOTOMY     Family History  Problem Relation Age of Onset  . Lymphoma Father   . Asthma Mother   . Osteoporosis Mother   . Mental illness Brother        suicide  . Pancreatic cancer Other        uncle  . COPD Maternal Grandmother   . Breast cancer Neg Hx   . Colon cancer Neg Hx    Social History   Socioeconomic History  . Marital status: Widowed    Spouse name: Not on file  . Number of children: 0  . Years of education: Not on file  . Highest education level: Not on file  Occupational History  . Not on file  Tobacco Use  . Smoking status:  Former Smoker    Quit date: 03/11/1999    Years since quitting: 21.4  . Smokeless tobacco: Never Used  Vaping Use  . Vaping Use: Never used  Substance and Sexual Activity  . Alcohol use: No    Alcohol/week: 0.0 standard drinks  . Drug use: No  . Sexual activity: Not on file  Other Topics Concern  . Not on file  Social History Narrative  . Not on file   Social Determinants of Health   Financial Resource Strain: Not on file  Food Insecurity: Not on file  Transportation Needs: Not on file  Physical Activity: Not on file  Stress: Not on file  Social Connections: Not on file    Outpatient Encounter Medications as of 08/07/2020  Medication Sig  . Adalimumab (HUMIRA) 40 MG/0.4ML PSKT Day 1 inject 2 pens SQ, Day 15 inject 1 pen SQ & Day 29 inject 1 pen SQ  . B Complex Vitamins (B COMPLEX 50) TABS Take 1 capsule by mouth daily.  Marland Kitchen buPROPion (WELLBUTRIN XL) 150 MG 24 hr tablet TAKE 1 TABLET BY MOUTH ONCE DAILY.  . cholecalciferol (VITAMIN D) 400 UNITS TABS tablet Take 400 Units by mouth 3 (three) times a week.  . fluticasone (FLONASE) 50 MCG/ACT nasal spray 2 sprays each nostril one time per day (in pm).  Marland Kitchen  HUMIRA PEN-CD/UC/HS STARTER 80 MG/0.8ML PNKT Inject into the skin.  . hydrochlorothiazide (MICROZIDE) 12.5 MG capsule TAKE (1) CAPSULE BY MOUTH ONCE DAILY.  Marland Kitchen LIALDA 1.2 g EC tablet   . Multiple Vitamins-Minerals (ALGAE BASED CALCIUM) TABS Take 1 tablet by mouth daily.  . pantoprazole (PROTONIX) 40 MG tablet Take 1 tablet (40 mg total) by mouth daily.  . valACYclovir (VALTREX) 1000 MG tablet Use as directed.  . zolpidem (AMBIEN) 5 MG tablet TAKE 1/2 TABLET BY MOUTH AT BEDTIME AS NEEDED.  . [DISCONTINUED] cephALEXin (KEFLEX) 500 MG capsule Take 500 mg by mouth 4 (four) times daily.   No facility-administered encounter medications on file as of 08/07/2020.    Review of Systems  Constitutional: Negative for appetite change and unexpected weight change.  HENT: Negative for congestion  and sinus pressure.   Respiratory: Negative for cough, chest tightness and shortness of breath.   Cardiovascular: Negative for chest pain, palpitations and leg swelling.  Gastrointestinal: Negative for abdominal pain, diarrhea, nausea and vomiting.  Genitourinary: Negative for difficulty urinating and dysuria.  Musculoskeletal: Negative for myalgias.       Joint pain as outlined.  Left thumb pain and right wrist pain as outlined.   Skin: Negative for color change and rash.  Neurological: Negative for dizziness, light-headedness and headaches.  Psychiatric/Behavioral: Negative for agitation and dysphoric mood.       Objective:    Physical Exam Vitals reviewed.  Constitutional:      General: She is not in acute distress.    Appearance: Normal appearance.  HENT:     Head: Normocephalic and atraumatic.     Right Ear: External ear normal.     Left Ear: External ear normal.  Eyes:     General: No scleral icterus.       Right eye: No discharge.        Left eye: No discharge.     Conjunctiva/sclera: Conjunctivae normal.  Neck:     Thyroid: No thyromegaly.  Cardiovascular:     Rate and Rhythm: Normal rate and regular rhythm.  Pulmonary:     Effort: No respiratory distress.     Breath sounds: Normal breath sounds. No wheezing.  Abdominal:     General: Bowel sounds are normal.     Palpations: Abdomen is soft.     Tenderness: There is no abdominal tenderness.  Musculoskeletal:        General: No swelling or tenderness.     Cervical back: Neck supple. No tenderness.  Lymphadenopathy:     Cervical: No cervical adenopathy.  Skin:    Findings: No erythema or rash.  Neurological:     Mental Status: She is alert.  Psychiatric:        Mood and Affect: Mood normal.        Behavior: Behavior normal.     BP 122/70   Pulse 98   Temp 97.6 F (36.4 C)   Resp 16   Ht 5' 5"  (1.651 m)   Wt 156 lb 6.4 oz (70.9 kg)   LMP 04/12/2010   SpO2 98%   BMI 26.03 kg/m  Wt Readings from  Last 3 Encounters:  08/07/20 156 lb 6.4 oz (70.9 kg)  07/11/20 159 lb (72.1 kg)  06/27/20 160 lb 12.8 oz (72.9 kg)     Lab Results  Component Value Date   WBC 9.0 12/30/2018   HGB 13.9 12/30/2018   HCT 42.9 12/30/2018   PLT 290.0 12/30/2018   GLUCOSE 115 (H) 08/07/2020  CHOL 179 05/26/2019   TRIG 92 05/26/2019   HDL 63 05/26/2019   LDLCALC 99 05/26/2019   ALT 23 08/07/2020   AST 23 08/07/2020   NA 140 08/07/2020   K 3.9 08/07/2020   CL 102 08/07/2020   CREATININE 0.81 08/07/2020   BUN 17 08/07/2020   CO2 30 08/07/2020   TSH 2.61 12/30/2018    CT ENTERO ABD/PELVIS W CONTAST  Result Date: 03/30/2020 CLINICAL DATA:  Follow-up Crohn's disease. EXAM: CT ABDOMEN AND PELVIS WITH CONTRAST (ENTEROGRAPHY) TECHNIQUE: Multidetector CT of the abdomen and pelvis during bolus administration of intravenous contrast. Negative oral contrast was given. CONTRAST:  152m OMNIPAQUE IOHEXOL 300 MG/ML  SOLN COMPARISON:  12/14/2015 FINDINGS: Lower chest: The lung bases are clear of an acute process. No pleural effusions or pulmonary lesions. The heart is normal in size. There is a small hiatal hernia noted. Hepatobiliary: Diffuse fatty infiltration of the liver is noted. No focal hepatic lesions or intrahepatic biliary dilatation. The gallbladder is unremarkable. No common bile duct dilatation. Pancreas: No mass, inflammation or ductal dilatation. Spleen: Normal size.  No focal lesions. Adrenals/Urinary Tract: The adrenal glands and kidneys are unremarkable. The bladder is normal. Stomach/Bowel: The stomach is well distended. No mass or inflammatory process. The duodenum and small bowel are unremarkable. No findings for obstruction or inflammation. The terminal ileum is normal. No evidence of active Crohn's disease. The colon is unremarkable. No colonic inflammatory process. Moderate diffuse colonic diverticulosis without findings for acute diverticulitis. The appendix is normal. Vascular/Lymphatic: The aorta  is normal in caliber. No dissection. The branch vessels are patent. The major venous structures are patent. No mesenteric or retroperitoneal mass or adenopathy. Small scattered lymph nodes are noted. Reproductive: The uterus and ovaries are unremarkable. Other: No pelvic mass or adenopathy. No free pelvic fluid collections. No inguinal mass or adenopathy. No abdominal wall hernia or subcutaneous lesions. Musculoskeletal: No significant bony findings. IMPRESSION: 1. No CT findings for active Crohn's disease. 2. No acute abdominal/pelvic findings, mass lesions or adenopathy. 3. Diffuse fatty infiltration of the liver. 4. Colonic diverticulosis without findings for diverticulitis. 5. Small hiatal hernia. Electronically Signed   By: PMarijo SanesM.D.   On: 03/30/2020 13:57       Assessment & Plan:   Problem List Items Addressed This Visit    Anxiety    Increased stress.  Increased stress with work.  On wellbutrin.  Notify me if feels needs any further intervention.  Follow.       Crohn's disease ([place]    Colonoscopy 01/2020 - pathology - chronic colitis - ascending, transverse.  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  Recommended f/u colonoscopy in 1-2 years.  Continue f/u with GI.  Bowels stable.       Relevant Orders   Hepatic function panel (Completed)   Basic metabolic panel (Completed)   ANA (Completed)   GERD (gastroesophageal reflux disease)    No upper symptoms reported.  On protonix.        Hypertension, essential - Primary    Continue hctz.  Blood pressure as outlined.  Have her follow pressures.  Follow metabolic panel.       Joint swelling    Discussed thumb and wrist pain as outlined.  Discussed referral with GI to rheumatology.        Swelling of lower extremity    Lymphedema.  Continue compression hose and leg elevation.  Saw [facility]. Discussed lymphedema pump.  [name], MD

## 2020-08-08 LAB — BASIC METABOLIC PANEL
BUN: 17 mg/dL (ref 6–23)
CO2: 30 mEq/L (ref 19–32)
Calcium: 10.5 mg/dL (ref 8.4–10.5)
Chloride: 102 mEq/L (ref 96–112)
Creatinine, Ser: 0.81 mg/dL (ref 0.40–1.20)
GFR: 76.87 mL/min (ref >60.00)
Glucose, Bld: 115 mg/dL — ABNORMAL HIGH (ref 70–99)
Potassium: 3.9 mEq/L (ref 3.5–5.1)
Sodium: 140 mEq/L (ref 135–145)

## 2020-08-08 LAB — HEPATIC FUNCTION PANEL
ALT: 23 U/L (ref 0–35)
AST: 23 U/L (ref 0–37)
Albumin: 4.6 g/dL (ref 3.5–5.2)
Alkaline Phosphatase: 106 U/L (ref 39–117)
Bilirubin, Direct: 0.1 mg/dL (ref 0.0–0.3)
Total Bilirubin: 0.5 mg/dL (ref 0.2–1.2)
Total Protein: 7 g/dL (ref 6.0–8.3)

## 2020-08-11 ENCOUNTER — Other Ambulatory Visit: Payer: Self-pay | Admitting: Internal Medicine

## 2020-08-11 LAB — ANA: Anti Nuclear Antibody (ANA): NEGATIVE

## 2020-08-12 ENCOUNTER — Encounter: Payer: Self-pay | Admitting: Internal Medicine

## 2020-08-12 NOTE — Assessment & Plan Note (Signed)
Lymphedema.  Continue compression hose and leg elevation.  Saw [facility]. Discussed lymphedema pump.

## 2020-08-12 NOTE — Assessment & Plan Note (Signed)
Discussed thumb and wrist pain as outlined.  Discussed referral with GI to rheumatology.

## 2020-08-12 NOTE — Assessment & Plan Note (Signed)
Increased stress.  Increased stress with work.  On wellbutrin.  Notify me if feels needs any further intervention.  Follow.

## 2020-08-12 NOTE — Assessment & Plan Note (Addendum)
Colonoscopy 01/2020 - pathology - chronic colitis - ascending, transverse.  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  Recommended f/u colonoscopy in 1-2 years.  Continue f/u with GI.  Bowels stable.

## 2020-08-12 NOTE — Assessment & Plan Note (Signed)
No upper symptoms reported.  On protonix.

## 2020-08-12 NOTE — Assessment & Plan Note (Signed)
Continue hctz.  Blood pressure as outlined.  Have her follow pressures.  Follow metabolic panel.

## 2020-09-12 ENCOUNTER — Other Ambulatory Visit: Payer: Self-pay | Admitting: Internal Medicine

## 2020-09-13 ENCOUNTER — Other Ambulatory Visit: Payer: Self-pay | Admitting: Internal Medicine

## 2020-10-13 ENCOUNTER — Other Ambulatory Visit: Payer: Self-pay | Admitting: Internal Medicine

## 2020-11-08 ENCOUNTER — Ambulatory Visit (INDEPENDENT_AMBULATORY_CARE_PROVIDER_SITE_OTHER): Payer: 59 | Admitting: Internal Medicine

## 2020-11-08 ENCOUNTER — Other Ambulatory Visit (HOSPITAL_COMMUNITY)
Admission: RE | Admit: 2020-11-08 | Discharge: 2020-11-08 | Disposition: A | Discharge status: Home / Self Care | Payer: 59 | Source: Ambulatory Visit | Attending: Internal Medicine | Admitting: Internal Medicine

## 2020-11-08 ENCOUNTER — Encounter: Payer: Self-pay | Admitting: Internal Medicine

## 2020-11-08 ENCOUNTER — Other Ambulatory Visit: Payer: Self-pay

## 2020-11-08 ENCOUNTER — Telehealth: Payer: Self-pay

## 2020-11-08 VITALS — BP 126/80 | HR 86 | Temp 97.8°F | Resp 16 | Ht 65.0 in | Wt 151.0 lb

## 2020-11-08 DIAGNOSIS — I10 Essential (primary) hypertension: Secondary | ICD-10-CM | POA: Diagnosis not present

## 2020-11-08 DIAGNOSIS — Z124 Encounter for screening for malignant neoplasm of cervix: Secondary | ICD-10-CM | POA: Diagnosis present

## 2020-11-08 DIAGNOSIS — R609 Edema, unspecified: Secondary | ICD-10-CM

## 2020-11-08 DIAGNOSIS — K219 Gastro-esophageal reflux disease without esophagitis: Secondary | ICD-10-CM

## 2020-11-08 DIAGNOSIS — Z23 Encounter for immunization: Secondary | ICD-10-CM | POA: Diagnosis not present

## 2020-11-08 DIAGNOSIS — K50919 Crohn's disease, unspecified, with unspecified complications: Secondary | ICD-10-CM

## 2020-11-08 DIAGNOSIS — Z0000 Encounter for general adult medical examination without abnormal findings: Secondary | ICD-10-CM | POA: Diagnosis not present

## 2020-11-08 DIAGNOSIS — M810 Age-related osteoporosis without current pathological fracture: Secondary | ICD-10-CM | POA: Diagnosis not present

## 2020-11-08 DIAGNOSIS — Z13220 Encounter for screening for lipoid disorders: Secondary | ICD-10-CM

## 2020-11-08 DIAGNOSIS — F419 Anxiety disorder, unspecified: Secondary | ICD-10-CM

## 2020-11-08 NOTE — Telephone Encounter (Signed)
I will refer her to endocrinology and they will discuss treatment options.

## 2020-11-08 NOTE — Assessment & Plan Note (Addendum)
Physical today 11/08/20.  PAP 11/08/20.   Mammogram 02/06/20 Bir[facility] 0.  Recommended f/u right breast mammogram.  Right breast mammogram 02/20/20 - Birads I. Recommended screening mammogram in one year. Colonoscopy 01/27/20 - pathology. - chronic colitis - ascending, transverse.  Sigmoid- chronic colitis - minimal activity (cryptitis).  Recommended f/u colonoscopy in 1-2 years.

## 2020-11-08 NOTE — Progress Notes (Signed)
Patient ID: Lauren Chang, female   DOB: Feb 19, 1956, 65 y.o.   MRN: [id number]   Subjective:    Patient ID: Lauren Chang, female    DOB: 1955/10/28, 65 y.o.   MRN: [id number]  This visit occurred during the SARS-CoV-2 public health emergency.  Safety protocols were in place, including screening questions prior to the visit, additional usage of staff PPE, and extensive cleaning of exam room while observing appropriate contact time as indicated for disinfecting solutions.   Patient here for her physical exam.   Chief Complaint  Patient presents with   Annual Exam   .   HPI She reports she is doing relatively well.  Working.  Increased stress.  Overall she feels she is handling things relatively well.  Does not feel needs any further intervention at this time.  Tries to stay active.  No chest pain or sob reported.  Eating.  Weight has decreased some from previous check.  Has crohn's.  On humira.  Overall feels bowels are stable.  Bowels movements pretty regular.  No acid reflux.  No abdominal pain reported.  Some stiffness in am.  Followed by Dr Jefm Bryant.  Discussed bone density.  Interested in [place], etc.  Discussed referral to endocrinology.     Past Medical History:  Diagnosis Date   Chlamydia    Crohn's disease ([place])    large intestine   GERD (gastroesophageal reflux disease)    Hypertension    Past Surgical History:  Procedure Laterality Date   COLONOSCOPY WITH PROPOFOL N/A 01/27/2020   Procedure: COLONOSCOPY WITH PROPOFOL;  Surgeon: Lesly Rubenstein, MD;  Location: [facility] ENDOSCOPY;  Service: Endoscopy;  Laterality: N/A;   laparoscopy and tuba repair     orif right calcaneus     TONSILECTOMY/ADENOIDECTOMY WITH MYRINGOTOMY     Family History  Problem Relation Age of Onset   Lymphoma Father    Asthma Mother    Osteoporosis Mother    Mental illness Brother        suicide   Pancreatic cancer Other        uncle   COPD Maternal Grandmother    Breast cancer Neg Hx     Colon cancer Neg Hx    Social History   Socioeconomic History   Marital status: Widowed    Spouse name: Not on file   Number of children: 0   Years of education: Not on file   Highest education level: Not on file  Occupational History   Not on file  Tobacco Use   Smoking status: Former    Types: Cigarettes    Quit date: 03/11/1999    Years since quitting: 21.6   Smokeless tobacco: Never  Vaping Use   Vaping Use: Never used  Substance and Sexual Activity   Alcohol use: No    Alcohol/week: 0.0 standard drinks   Drug use: No   Sexual activity: Not on file  Other Topics Concern   Not on file  Social History Narrative   Not on file   Social Determinants of Health   Financial Resource Strain: Not on file  Food Insecurity: Not on file  Transportation Needs: Not on file  Physical Activity: Not on file  Stress: Not on file  Social Connections: Not on file    Review of Systems  Constitutional:  Negative for appetite change and unexpected weight change.  HENT:  Negative for congestion, sinus pressure and sore throat.   Eyes:  Negative for pain and visual disturbance.  Respiratory:  Negative for cough, chest tightness and shortness of breath.   Cardiovascular:  Negative for chest pain, palpitations and leg swelling.  Gastrointestinal:  Negative for abdominal pain, diarrhea, nausea and vomiting.  Genitourinary:  Negative for difficulty urinating and dysuria.  Musculoskeletal:  Negative for back pain and joint swelling.  Skin:  Negative for color change and rash.  Neurological:  Negative for dizziness, light-headedness and headaches.  Hematological:  Negative for adenopathy. Does not bruise/bleed easily.  Psychiatric/Behavioral:  Negative for decreased concentration and dysphoric mood.       Objective:     BP 126/80   Pulse 86   Temp 97.8 F (36.6 C)   Resp 16   Ht 5' 5"  (1.651 m)   Wt 151 lb (68.5 kg)   LMP 04/12/2010   SpO2 98%   BMI 25.13 kg/m  Wt Readings  from Last 3 Encounters:  11/08/20 151 lb (68.5 kg)  08/07/20 156 lb 6.4 oz (70.9 kg)  07/11/20 159 lb (72.1 kg)    Physical Exam Vitals reviewed.  Constitutional:      General: She is not in acute distress.    Appearance: Normal appearance. She is well-developed.  HENT:     Head: Normocephalic and atraumatic.     Right Ear: External ear normal.     Left Ear: External ear normal.  Eyes:     General: No scleral icterus.       Right eye: No discharge.        Left eye: No discharge.     Conjunctiva/sclera: Conjunctivae normal.  Neck:     Thyroid: No thyromegaly.  Cardiovascular:     Rate and Rhythm: Normal rate and regular rhythm.  Pulmonary:     Effort: No tachypnea, accessory muscle usage or respiratory distress.     Breath sounds: Normal breath sounds. No decreased breath sounds or wheezing.  Chest:  Breasts:    Right: No inverted nipple, mass, nipple discharge or tenderness (no axillary adenopathy).     Left: No inverted nipple, mass, nipple discharge or tenderness (no axilarry adenopathy).  Abdominal:     General: Bowel sounds are normal.     Palpations: Abdomen is soft.     Tenderness: There is no abdominal tenderness.  Genitourinary:    Comments: Normal external genitalia.  Vaginal vault without lesions.  Atrophy changes present. Cervix identified.  Pap smear performed.  Could not appreciate any adnexal masses or tenderness.   Musculoskeletal:        General: No swelling or tenderness.     Cervical back: Neck supple. No tenderness.  Lymphadenopathy:     Cervical: No cervical adenopathy.  Skin:    Findings: No erythema or rash.  Neurological:     Mental Status: She is alert and oriented to person, place, and time.  Psychiatric:        Mood and Affect: Mood normal.        Behavior: Behavior normal.    Outpatient Encounter Medications as of 11/08/2020  Medication Sig   Adalimumab (HUMIRA) 40 MG/0.4ML PSKT Day 1 inject 2 pens SQ, Day 15 inject 1 pen SQ & Day 29  inject 1 pen SQ   B Complex Vitamins (B COMPLEX 50) TABS Take 1 capsule by mouth daily.   buPROPion (WELLBUTRIN XL) 150 MG 24 hr tablet TAKE 1 TABLET BY MOUTH ONCE DAILY.   cholecalciferol (VITAMIN D) 400 UNITS TABS tablet Take 400 Units by mouth 3 (three) times a week.   fluticasone (  FLONASE) 50 MCG/ACT nasal spray 2 sprays each nostril one time per day (in pm).   HUMIRA PEN-CD/UC/HS STARTER 80 MG/0.8ML PNKT Inject into the skin.   hydrochlorothiazide (MICROZIDE) 12.5 MG capsule TAKE (1) CAPSULE BY MOUTH ONCE DAILY.   Multiple Vitamins-Minerals (ALGAE BASED CALCIUM) TABS Take 1 tablet by mouth daily.   pantoprazole (PROTONIX) 40 MG tablet Take 1 tablet (40 mg total) by mouth daily.   valACYclovir (VALTREX) 1000 MG tablet Use as directed.   zolpidem (AMBIEN) 5 MG tablet TAKE 1/2 TABLET BY MOUTH AT BEDTIME AS NEEDED.   [DISCONTINUED] LIALDA 1.2 g EC tablet    No facility-administered encounter medications on file as of 11/08/2020.     Lab Results  Component Value Date   WBC 6.8 11/08/2020   HGB 13.7 11/08/2020   HCT 41.7 11/08/2020   PLT 246.0 11/08/2020   GLUCOSE 88 11/08/2020   CHOL 196 11/08/2020   TRIG 144.0 11/08/2020   HDL 56.50 11/08/2020   LDLCALC 111 (H) 11/08/2020   ALT 27 11/08/2020   AST 23 11/08/2020   NA 141 11/08/2020   K 4.0 11/08/2020   CL 100 11/08/2020   CREATININE 0.78 11/08/2020   BUN 15 11/08/2020   CO2 32 11/08/2020   TSH 0.90 11/08/2020    CT ENTERO ABD/PELVIS W CONTAST  Result Date: 03/30/2020 CLINICAL DATA:  Follow-up Crohn's disease. EXAM: CT ABDOMEN AND PELVIS WITH CONTRAST (ENTEROGRAPHY) TECHNIQUE: Multidetector CT of the abdomen and pelvis during bolus administration of intravenous contrast. Negative oral contrast was given. CONTRAST:  110m OMNIPAQUE IOHEXOL 300 MG/ML  SOLN COMPARISON:  12/14/2015 FINDINGS: Lower chest: The lung bases are clear of an acute process. No pleural effusions or pulmonary lesions. The heart is normal in size. There is a  small hiatal hernia noted. Hepatobiliary: Diffuse fatty infiltration of the liver is noted. No focal hepatic lesions or intrahepatic biliary dilatation. The gallbladder is unremarkable. No common bile duct dilatation. Pancreas: No mass, inflammation or ductal dilatation. Spleen: Normal size.  No focal lesions. Adrenals/Urinary Tract: The adrenal glands and kidneys are unremarkable. The bladder is normal. Stomach/Bowel: The stomach is well distended. No mass or inflammatory process. The duodenum and small bowel are unremarkable. No findings for obstruction or inflammation. The terminal ileum is normal. No evidence of active Crohn's disease. The colon is unremarkable. No colonic inflammatory process. Moderate diffuse colonic diverticulosis without findings for acute diverticulitis. The appendix is normal. Vascular/Lymphatic: The aorta is normal in caliber. No dissection. The branch vessels are patent. The major venous structures are patent. No mesenteric or retroperitoneal mass or adenopathy. Small scattered lymph nodes are noted. Reproductive: The uterus and ovaries are unremarkable. Other: No pelvic mass or adenopathy. No free pelvic fluid collections. No inguinal mass or adenopathy. No abdominal wall hernia or subcutaneous lesions. Musculoskeletal: No significant bony findings. IMPRESSION: 1. No CT findings for active Crohn's disease. 2. No acute abdominal/pelvic findings, mass lesions or adenopathy. 3. Diffuse fatty infiltration of the liver. 4. Colonic diverticulosis without findings for diverticulitis. 5. Small hiatal hernia. Electronically Signed   By: PMarijo SanesM.D.   On: 03/30/2020 13:57       Assessment & Plan:   Problem List Items Addressed This Visit     Anxiety    Continue wellbutrin.  Does not feel needs any further intervention.  Follow.       Crohn's disease ([place]    Colonoscopy 01/2020 - pathology - chronic colitis - ascending, transverse.  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  Recommended f/u colonoscopy in 1-2 years.  Continue f/u with GI.  Bowels stable.  On humira.       Dependent edema    Compression hose.       GERD (gastroesophageal reflux disease)    No upper symptoms reported.  On protonix.        Health care maintenance    Physical today 11/08/20.  PAP 11/08/20.   Mammogram 02/06/20 Birads 0.  Recommended f/u right breast mammogram.  Right breast mammogram 02/20/20 - Birads I. Recommended screening mammogram in one year. Colonoscopy 01/27/20 - pathology. - chronic colitis - ascending, transverse.  Sigmoid- chronic colitis - minimal activity (cryptitis).  Recommended f/u colonoscopy in 1-2 years.        Hypertension, essential    Continue hctz.  Blood pressure as outlined.  Follow pressures.  Follow metabolic panel.       Relevant Orders   CBC with Differential/Platelet (Completed)   Hepatic function panel (Completed)   TSH (Completed)   Basic metabolic panel (Completed)   Osteoporosis    Discussed bone density and treatment options.  Interested in [place].  Check labs, including urine and vitamin D.  Continue calcium, vitamin D and weight bearing exercise.        Relevant Orders   VITAMIN D 25 Hydroxy (Vit-D Deficiency, Fractures) (Completed)   Urinalysis, Routine w reflex microscopic (Completed)   Ambulatory referral to Endocrinology   Other Visit Diagnoses     Routine general medical examination at a health care facility    -  Primary   Screening cholesterol level       Relevant Orders   Lipid panel (Completed)   Cervical cancer screening       Relevant Orders   Cytology - PAP( [place])   Need for immunization against influenza       Relevant Orders   Flu Vaccine QUAD 103moIM (Fluarix, Fluzone & Alfiuria Quad PF) (Completed)        CEinar Pheasant MD

## 2020-11-08 NOTE — Telephone Encounter (Signed)
FYI she just discussed with you

## 2020-11-08 NOTE — Telephone Encounter (Signed)
Pt states that she called her insurance will not cover the medication that she talked about with Dr Nicki Reaper. The med name is Reclast. Please advise

## 2020-11-09 LAB — URINALYSIS, ROUTINE W REFLEX MICROSCOPIC
Bilirubin Urine: NEGATIVE
Hgb urine dipstick: NEGATIVE
Ketones, ur: NEGATIVE
Leukocytes,Ua: NEGATIVE
Nitrite: NEGATIVE
RBC / HPF: NONE SEEN (ref 0–?)
Specific Gravity, Urine: <=1.005 — AB (ref 1.000–1.030)
Total Protein, Urine: NEGATIVE
Urine Glucose: NEGATIVE
Urobilinogen, UA: 0.2 (ref 0.0–1.0)
WBC, UA: NONE SEEN (ref 0–?)
pH: 7 (ref 5.0–8.0)

## 2020-11-09 LAB — CBC WITH DIFFERENTIAL/PLATELET
Basophils Absolute: 0.1 10*3/uL (ref 0.0–0.1)
Basophils Relative: 0.8 % (ref 0.0–3.0)
Eosinophils Absolute: 0.2 10*3/uL (ref 0.0–0.7)
Eosinophils Relative: 2.8 % (ref 0.0–5.0)
HCT: 41.7 % (ref 36.0–46.0)
Hemoglobin: 13.7 g/dL (ref 12.0–15.0)
Lymphocytes Relative: 35 % (ref 12.0–46.0)
Lymphs Abs: 2.4 10*3/uL (ref 0.7–4.0)
MCHC: 32.8 g/dL (ref 30.0–36.0)
MCV: 87.6 fl (ref 78.0–100.0)
Monocytes Absolute: 0.7 10*3/uL (ref 0.1–1.0)
Monocytes Relative: 10.9 % (ref 3.0–12.0)
Neutro Abs: 3.4 10*3/uL (ref 1.4–7.7)
Neutrophils Relative %: 50.5 % (ref 43.0–77.0)
Platelets: 246 10*3/uL (ref 150.0–400.0)
RBC: 4.76 Mil/uL (ref 3.87–5.11)
RDW: 13.7 % (ref 11.5–15.5)
WBC: 6.8 10*3/uL (ref 4.0–10.5)

## 2020-11-09 LAB — BASIC METABOLIC PANEL
BUN: 15 mg/dL (ref 6–23)
CO2: 32 mEq/L (ref 19–32)
Calcium: 10.5 mg/dL (ref 8.4–10.5)
Chloride: 100 mEq/L (ref 96–112)
Creatinine, Ser: 0.78 mg/dL (ref 0.40–1.20)
GFR: 80.29 mL/min (ref >60.00)
Glucose, Bld: 88 mg/dL (ref 70–99)
Potassium: 4 mEq/L (ref 3.5–5.1)
Sodium: 141 mEq/L (ref 135–145)

## 2020-11-09 LAB — LIPID PANEL
Cholesterol: 196 mg/dL (ref 0–200)
HDL: 56.5 mg/dL (ref >39.00)
LDL Cholesterol: 111 mg/dL — ABNORMAL HIGH (ref 0–99)
NonHDL: 139.48
Total CHOL/HDL Ratio: 3
Triglycerides: 144 mg/dL (ref 0.0–149.0)
VLDL: 28.8 mg/dL (ref 0.0–40.0)

## 2020-11-09 LAB — HEPATIC FUNCTION PANEL
ALT: 27 U/L (ref 0–35)
AST: 23 U/L (ref 0–37)
Albumin: 4.4 g/dL (ref 3.5–5.2)
Alkaline Phosphatase: 107 U/L (ref 39–117)
Bilirubin, Direct: 0.1 mg/dL (ref 0.0–0.3)
Total Bilirubin: 0.5 mg/dL (ref 0.2–1.2)
Total Protein: 7.2 g/dL (ref 6.0–8.3)

## 2020-11-09 LAB — VITAMIN D 25 HYDROXY (VIT D DEFICIENCY, FRACTURES): VITD: 47.85 ng/mL (ref 30.00–100.00)

## 2020-11-09 LAB — TSH: TSH: 0.9 u[IU]/mL (ref 0.35–5.50)

## 2020-11-09 NOTE — Telephone Encounter (Signed)
Patient is aware of below.

## 2020-11-12 ENCOUNTER — Encounter: Payer: Self-pay | Admitting: Internal Medicine

## 2020-11-12 NOTE — Assessment & Plan Note (Signed)
Continue hctz.  Blood pressure as outlined.  Follow pressures.  Follow metabolic panel.

## 2020-11-12 NOTE — Assessment & Plan Note (Signed)
Colonoscopy 01/2020 - pathology - chronic colitis - ascending, transverse.  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  Recommended f/u colonoscopy in 1-2 years.  Continue f/u with GI.  Bowels stable.  On humira.

## 2020-11-12 NOTE — Assessment & Plan Note (Signed)
Discussed bone density and treatment options.  Interested in [place].  Check labs, including urine and vitamin D.  Continue calcium, vitamin D and weight bearing exercise.

## 2020-11-12 NOTE — Assessment & Plan Note (Signed)
Continue wellbutrin.  Does not feel needs any further intervention.  Follow.

## 2020-11-12 NOTE — Assessment & Plan Note (Signed)
Compression hose.   

## 2020-11-12 NOTE — Assessment & Plan Note (Signed)
No upper symptoms reported.  On protonix.

## 2020-11-14 LAB — CYTOLOGY - PAP
Comment: NEGATIVE
Diagnosis: NEGATIVE
High risk HPV: NEGATIVE

## 2020-11-17 ENCOUNTER — Other Ambulatory Visit: Payer: Self-pay | Admitting: Internal Medicine

## 2020-11-19 NOTE — Telephone Encounter (Signed)
RX Refill:ambien Last Seen:11-08-20 Last ordered:08-13-20

## 2020-11-20 NOTE — Telephone Encounter (Signed)
Rx ok'd for [facility] #15 with one refill.

## 2021-01-10 ENCOUNTER — Ambulatory Visit (INDEPENDENT_AMBULATORY_CARE_PROVIDER_SITE_OTHER): Payer: 59 | Admitting: Vascular Surgery

## 2021-01-10 ENCOUNTER — Encounter: Payer: Self-pay | Admitting: Occupational Therapy

## 2021-01-10 ENCOUNTER — Ambulatory Visit: Payer: 59 | Attending: Rheumatology | Admitting: Occupational Therapy

## 2021-01-10 DIAGNOSIS — M25542 Pain in joints of left hand: Secondary | ICD-10-CM | POA: Diagnosis present

## 2021-01-10 DIAGNOSIS — M25642 Stiffness of left hand, not elsewhere classified: Secondary | ICD-10-CM | POA: Insufficient documentation

## 2021-01-10 DIAGNOSIS — M25641 Stiffness of right hand, not elsewhere classified: Secondary | ICD-10-CM | POA: Diagnosis present

## 2021-01-10 DIAGNOSIS — M65312 Trigger thumb, left thumb: Secondary | ICD-10-CM | POA: Insufficient documentation

## 2021-01-10 NOTE — Therapy (Signed)
[facility] [address], [place], [address] Phone: [phone]   Fax:  [phone]  Occupational Therapy Evaluation  Patient Details  Name: Lauren Chang MRN: [id number] Date of Birth: May 28, 1955 Referring Provider (OT): DR Jefm Bryant   Encounter Date: 01/10/2021   OT End of Session - 01/10/21 1312     Visit Number 1    Number of Visits 2    Date for OT Re-Evaluation 02/07/21    OT Start Time 0815    OT Stop Time 0901    OT Time Calculation (min) 46 min    Activity Tolerance Patient tolerated treatment well    Behavior During Therapy [facility] for tasks assessed/performed             Past Medical History:  Diagnosis Date   Chlamydia    Crohn's disease ([place])    large intestine   GERD (gastroesophageal reflux disease)    Hypertension     Past Surgical History:  Procedure Laterality Date   COLONOSCOPY WITH PROPOFOL N/A 01/27/2020   Procedure: COLONOSCOPY WITH PROPOFOL;  Surgeon: Lesly Rubenstein, MD;  Location: [facility] ENDOSCOPY;  Service: Endoscopy;  Laterality: N/A;   laparoscopy and tuba repair     orif right calcaneus     TONSILECTOMY/ADENOIDECTOMY WITH MYRINGOTOMY      There were no vitals filed for this visit.   Subjective Assessment - 01/10/21 1207     Subjective  Having since beginning of year more stiffness in my hands , thumbs  arthritis changes-and then had trigger thumb in June - got shot that worked for little bit -but then could not get back to MD - work just busy and not taking care of myself as I  should - Trigger thumb again and see DR 1-2wks ago for shot - I should have come and see you after June -but work just too busy -I came because I  want to try and prevent issues with other fingers    Pertinent History Dr Jefm Bryant appt 12/31/20 Major complaint is recurrence of left thumb trigger finger. Never went to hand therapy after June - when she had shot for trigger thumb . Feet about  the same. Episodically bothers her but she can rotate them and improved.  Worse bone density. Seeing endocrine  Crohn's is stable. Humira   -had another shot and refer to hand therapy    Patient Stated Goals Want to prevent issues with my other fingers - that this does not happend to others - and if you can help me with this thumb    Currently in Pain? 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, woodwork      Strength   Right Hand Grip (lbs) 40    Right Hand Lateral Pinch 12 lbs    Right Hand 3 Point Pinch 10 lbs    Left Hand Grip (lbs) 44    Left Hand Lateral Pinch 6 lbs    Left Hand 3 Point Pinch 8 lbs      Left Hand AROM   L Thumb MCP 0-60 65 Degrees   Poppiing , trigginer but not locking   L Thumb IP 0-80 60 Degrees    L Thumb Radial ADduction/ABduction 0-55 50   R50   L Thumb Palmar ADduction/ABduction 0-45 60   R 70   L Thumb Opposition to Index --   Opposition WNL base of 5th- but popping and chronic triggering , popping   L Index  MCP 0-90 90 Degrees    L Index PIP 0-100 110 Degrees    L Long  MCP 0-90 90 Degrees    L Long PIP 0-100 115 Degrees    L Ring  MCP 0-90 90 Degrees    L Ring PIP 0-100 115 Degrees    L Little  MCP 0-90 90 Degrees    L Little PIP 0-100 110 Degrees                      OT Treatments/Exercises (OP) - 01/10/21 0001       RUE Paraffin   Number Minutes Paraffin 8  Minutes    RUE Paraffin Location Hand    Comments decrease stiffness - prior to review of HEP      LUE Paraffin   Number Minutes Paraffin 8 Minutes    LUE Paraffin Location Hand    Comments decrease stiffness - prior to review of HEP                         OT Long Term Goals - 01/10/21 1942       OT LONG TERM GOAL #1   Title Pt to verbalize/demo understanding for joint protection and homeprogram to decrease stiffness, and pain in bilateral hands    Baseline no knowledge - stiffness and pain in bilateral hands - triggering of L thumb - had 2 shots and still triggering -refer to Orthopedics    Time 4    Period Weeks    Status New    Target Date 02/07/21                   Plan - 01/10/21 1527     Clinical Impression Statement Pt refer to OT with L trigger thumb and arthritis diagnosis - pt had trigger thumb since June 22- pt had shot at that time and then one about 2 wks ago - pt cont to show increase pain in L thumb , and some tenderness -  some popping and clicking still present. Pt  with AROM in bilateral digits WNL but report stiffness and pain in the am and during day with use - on computer , and yardwork/ woodwork - Grip and R prehension strength in range for her age - L prehension decrease. Pt ed on homeprogram,  joint protection and modifications - as well as AE to use to decrease pain and stiffness in hands and decrease risk for  future trigger fingers. Did recomment pt to see Orthopedics for L trigger thumb - pt in agreement - and to cont with home  program and can follow up with me if needed.    OT Occupational Profile and History Problem Focused Assessment - Including review of records relating to presenting problem    Occupational performance deficits (Please refer to evaluation for details): ADL's;IADL's;Work;Play;Leisure;Social Participation    Body Structure / Function / Physical Skills ADL;Strength;Pain;Edema;UE functional  use;IADL;ROM;Flexibility;Decreased knowledge of use of DME    Rehab Potential Fair    Clinical Decision Making Limited treatment options, no task modification necessary    Comorbidities Affecting Occupational Performance: None    Modification or Assistance to Complete Evaluation  No modification of tasks or assist necessary to complete eval    OT Frequency 1x / week    OT Duration 4 weeks    OT Treatment/Interventions Self-care/ADL training;Splinting;Paraffin;Manual Therapy;Patient/family education;Passive range of motion;Therapeutic exercise    Consulted and Agree with Plan of Care Patient             Patient will benefit from skilled therapeutic intervention in order to improve the following deficits and impairments:   Body Structure / Function / Physical Skills: ADL, Strength, Pain, Edema, UE functional use, IADL, ROM, Flexibility, Decreased knowledge of use of DME       Visit Diagnosis: Trigger thumb of left hand - Plan: Ot plan of care cert/re-cert  Stiffness of left hand, not elsewhere classified - Plan: Ot plan of care cert/re-cert  Stiffness of right hand, not elsewhere classified - Plan: Ot plan of care cert/re-cert  Pain in joint of left hand - Plan: Ot plan of care cert/re-cert    Problem List Patient Active Problem List   Diagnosis Date Noted   Swelling of lower extremity 06/11/2020   Breast cancer screening 11/06/2019   Back pain 07/17/2019   Dependent edema 05/09/2019   Hypertension, essential 03/05/2019   Left hip pain 07/18/2018   Arthralgia of left ankle 11/26/2015   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 [place], [address] Phone: [phone]   Fax:  [phone]  Name: Lauren Chang MRN: [id number] Date of Birth: Jan 03, 1956

## 2021-01-21 ENCOUNTER — Encounter (INDEPENDENT_AMBULATORY_CARE_PROVIDER_SITE_OTHER): Payer: Self-pay | Admitting: Vascular Surgery

## 2021-01-21 ENCOUNTER — Ambulatory Visit (INDEPENDENT_AMBULATORY_CARE_PROVIDER_SITE_OTHER): Payer: 59 | Admitting: Vascular Surgery

## 2021-01-21 ENCOUNTER — Other Ambulatory Visit: Payer: Self-pay

## 2021-01-21 VITALS — BP 122/76 | HR 85 | Resp 16 | Wt 148.4 lb

## 2021-01-21 DIAGNOSIS — K219 Gastro-esophageal reflux disease without esophagitis: Secondary | ICD-10-CM | POA: Diagnosis not present

## 2021-01-21 DIAGNOSIS — I10 Essential (primary) hypertension: Secondary | ICD-10-CM | POA: Diagnosis not present

## 2021-01-21 DIAGNOSIS — M7989 Other specified soft tissue disorders: Secondary | ICD-10-CM

## 2021-01-21 DIAGNOSIS — I872 Venous insufficiency (chronic) (peripheral): Secondary | ICD-10-CM | POA: Diagnosis not present

## 2021-01-21 NOTE — Progress Notes (Signed)
MRN : [id number]  Lauren Chang is a 65 y.o. (October 29, 1955) female who presents with chief complaint of check leg swelling.  History of Present Illness:   The patient returns to the office for followup evaluation regarding leg swelling.  The swelling has improved quite a bit and the pain associated with swelling has decreased substantially. There have not been any interval development of a ulcerations or wounds.  Since the previous visit the patient has been wearing graduated compression stockings and has noted little significant improvement in the lymphedema. The patient has been using compression routinely morning until night.  The patient also states elevation during the day and exercise is being done too.     Current Meds  Medication Sig   Adalimumab (HUMIRA) 40 MG/0.4ML PSKT Day 1 inject 2 pens SQ, Day 15 inject 1 pen SQ & Day 29 inject 1 pen SQ   B Complex Vitamins (B COMPLEX 50) TABS Take 1 capsule by mouth daily.   buPROPion (WELLBUTRIN XL) 150 MG 24 hr tablet TAKE 1 TABLET BY MOUTH ONCE DAILY.   cholecalciferol (VITAMIN D) 400 UNITS TABS tablet Take 400 Units by mouth 3 (three) times a week.   fluticasone (FLONASE) 50 MCG/ACT nasal spray 2 sprays each nostril one time per day (in pm).   hydrochlorothiazide (MICROZIDE) 12.5 MG capsule TAKE (1) CAPSULE BY MOUTH ONCE DAILY.   Multiple Vitamins-Minerals (ALGAE BASED CALCIUM) TABS Take 1 tablet by mouth daily.   pantoprazole (PROTONIX) 40 MG tablet Take 1 tablet (40 mg total) by mouth daily.   valACYclovir (VALTREX) 1000 MG tablet Use as directed.   zolpidem (AMBIEN) 5 MG tablet TAKE 1/2 TABLET BY MOUTH AT BEDTIME AS NEEDED.    Past Medical History:  Diagnosis Date   Chlamydia    Crohn's disease ([place])    large intestine   GERD (gastroesophageal reflux disease)    Hypertension     Past Surgical History:  Procedure Laterality Date   COLONOSCOPY WITH PROPOFOL N/A 01/27/2020   Procedure: COLONOSCOPY WITH PROPOFOL;   Surgeon: Lesly Rubenstein, MD;  Location: [facility] ENDOSCOPY;  Service: Endoscopy;  Laterality: N/A;   laparoscopy and tuba repair     orif right calcaneus     TONSILECTOMY/ADENOIDECTOMY WITH MYRINGOTOMY      Social History Social History   Tobacco Use   Smoking status: Former    Types: Cigarettes    Quit date: 03/11/1999    Years since quitting: 21.8   Smokeless tobacco: Never  Vaping Use   Vaping Use: Never used  Substance Use Topics   Alcohol use: No    Alcohol/week: 0.0 standard drinks   Drug use: No    Family History Family History  Problem Relation Age of Onset   Lymphoma Father    Asthma Mother    Osteoporosis Mother    Mental illness Brother        suicide   Pancreatic cancer Other        uncle   COPD Maternal Grandmother    Breast cancer Neg Hx    Colon cancer Neg Hx     Allergies  Allergen Reactions   Bactrim [Sulfamethoxazole-Trimethoprim] Nausea Only   Ciprofloxacin Other (See Comments)    Arthralgias    Ibuprofen Rash   Oxycodone Rash   Oxycontin [Oxycodone Hcl] Rash     REVIEW OF SYSTEMS (Negative unless checked)  Constitutional: [] Weight loss  [] Fever  [] Chills Cardiac: [] Chest pain   [] Chest pressure   [] Palpitations   []   Shortness of breath when laying flat   [] Shortness of breath with exertion. Vascular:  [] Pain in legs with walking   [] Pain in legs at rest  [] History of DVT   [] Phlebitis   [x] Swelling in legs   [] Varicose veins   [] Non-healing ulcers Pulmonary:   [] Uses home oxygen   [] Productive cough   [] Hemoptysis   [] Wheeze  [] COPD   [] Asthma Neurologic:  [] Dizziness   [] Seizures   [] History of stroke   [] History of TIA  [] Aphasia   [] Vissual changes   [] Weakness or numbness in arm   [] Weakness or numbness in leg Musculoskeletal:   [] Joint swelling   [] Joint pain   [] Low back pain Hematologic:  [] Easy bruising  [] Easy bleeding   [] Hypercoagulable state   [] Anemic Gastrointestinal:  [] Diarrhea   [] Vomiting  [x] Gastroesophageal  reflux/heartburn   [] Difficulty swallowing. Genitourinary:  [] Chronic kidney disease   [] Difficult urination  [] Frequent urination   [] Blood in urine Skin:  [] Rashes   [] Ulcers  Psychological:  [] History of anxiety   []  History of major depression.  Physical Examination  Vitals:   01/21/21 1526  BP: 122/76  Pulse: 85  Resp: 16  Weight: 148 lb 6.4 oz (67.3 kg)   Body mass index is 24.7 kg/m. Gen: WD/WN, NAD Head: [place]/AT, No temporalis wasting.  Ear/Nose/Throat: Hearing grossly intact, nares w/o erythema or drainage, pinna without lesions Eyes: PER, EOMI, sclera nonicteric.  Neck: Supple, no gross masses.  No JVD.  Pulmonary:  Good air movement, no audible wheezing, no use of accessory muscles.  Cardiac: RRR, precordium not hyperdynamic. Vascular:  scattered varicosities present bilaterally.  Mild venous stasis changes to the legs bilaterally.  1-2+ soft pitting edema  Vessel Right Left  Radial Palpable Palpable  Gastrointestinal: soft, non-distended. No guarding/no peritoneal signs.  Musculoskeletal: M/S 5/5 throughout.  No deformity.  Neurologic: CN 2-12 intact. Pain and light touch intact in extremities.  Symmetrical.  Speech is fluent. Motor exam as listed above. Psychiatric: Judgment intact, Mood & affect appropriate for pt's clinical situation. Dermatologic: Venous rashes no ulcers noted.  No changes consistent with cellulitis. Lymph : No lichenification or skin changes of chronic lymphedema.  CBC Lab Results  Component Value Date   WBC 6.8 11/08/2020   HGB 13.7 11/08/2020   HCT 41.7 11/08/2020   MCV 87.6 11/08/2020   PLT 246.0 11/08/2020    BMET    Component Value Date/Time   NA 141 11/08/2020 1505   NA 138 05/26/2019 0840   K 4.0 11/08/2020 1505   CL 100 11/08/2020 1505   CO2 32 11/08/2020 1505   GLUCOSE 88 11/08/2020 1505   BUN 15 11/08/2020 1505   BUN 21 05/26/2019 0840   CREATININE 0.78 11/08/2020 1505   CALCIUM 10.5 11/08/2020 1505   GFRNONAA 76  05/26/2019 0840   GFRAA 87 05/26/2019 0840   CrCl cannot be calculated (Patient's most recent lab result is older than the maximum 21 days allowed.).  COAG No results found for: INR, PROTIME  Radiology No results found.   Assessment/Plan 1. Swelling of lower extremity No surgery or intervention at this point in time.  I have reviewed my discussion with the patient regarding venous insufficiency and why it causes symptoms. I have discussed with the patient the chronic skin changes that accompany venous insufficiency and the long term sequela such as ulceration. Patient will contnue wearing graduated compression stockings on a daily basis, as this has provided excellent control of his edema. The patient will put the  stockings on first thing in the morning and removing them in the evening. The patient is reminded not to sleep in the stockings.  In addition, behavioral modification including elevation during the day will be initiated. Exercise is strongly encouraged.  Given the patient's good control and lack of any problems regarding the venous insufficiency and lymphedema a lymph pump in not need at this time.  The patient will follow up with me PRN should anything change.  The patient voices agreement with this plan.   2. Chronic venous insufficiency No surgery or intervention at this point in time.  I have reviewed my discussion with the patient regarding venous insufficiency and why it causes symptoms. I have discussed with the patient the chronic skin changes that accompany venous insufficiency and the long term sequela such as ulceration. Patient will contnue wearing graduated compression stockings on a daily basis, as this has provided excellent control of his edema. The patient will put the stockings on first thing in the morning and removing them in the evening. The patient is reminded not to sleep in the stockings.  In addition, behavioral modification including elevation during  the day will be initiated. Exercise is strongly encouraged.  Given the patient's good control and lack of any problems regarding the venous insufficiency and lymphedema a lymph pump in not need at this time.  The patient will follow up with me PRN should anything change.  The patient voices agreement with this plan.   3. Hypertension, essential Continue antihypertensive medications as already ordered, these medications have been reviewed and there are no changes at this time.   4. Gastroesophageal reflux disease, unspecified whether esophagitis present Continue PPI as already ordered, this medication has been reviewed and there are no changes at this time.  Avoidence of caffeine and alcohol  Moderate elevation of the head of the bed     Hortencia Pilar, MD  01/21/2021 3:31 PM

## 2021-01-22 ENCOUNTER — Encounter (INDEPENDENT_AMBULATORY_CARE_PROVIDER_SITE_OTHER): Payer: Self-pay | Admitting: Vascular Surgery

## 2021-01-22 DIAGNOSIS — I872 Venous insufficiency (chronic) (peripheral): Secondary | ICD-10-CM | POA: Insufficient documentation

## 2021-02-08 ENCOUNTER — Other Ambulatory Visit: Payer: Self-pay | Admitting: Internal Medicine

## 2021-02-08 DIAGNOSIS — Z1231 Encounter for screening mammogram for malignant neoplasm of breast: Secondary | ICD-10-CM

## 2021-02-16 ENCOUNTER — Other Ambulatory Visit: Payer: Self-pay | Admitting: Internal Medicine

## 2021-03-01 ENCOUNTER — Other Ambulatory Visit: Payer: Self-pay

## 2021-03-01 ENCOUNTER — Ambulatory Visit
Admission: RE | Admit: 2021-03-01 | Discharge: 2021-03-01 | Disposition: A | Discharge status: Home / Self Care | Payer: 59 | Source: Ambulatory Visit | Attending: Internal Medicine | Admitting: Internal Medicine

## 2021-03-01 DIAGNOSIS — Z1231 Encounter for screening mammogram for malignant neoplasm of breast: Secondary | ICD-10-CM | POA: Diagnosis present

## 2021-03-14 ENCOUNTER — Ambulatory Visit: Payer: Managed Care, Other (non HMO) | Admitting: Internal Medicine

## 2021-03-14 ENCOUNTER — Other Ambulatory Visit: Payer: Self-pay

## 2021-03-14 ENCOUNTER — Encounter: Payer: Self-pay | Admitting: Internal Medicine

## 2021-03-14 DIAGNOSIS — K50919 Crohn's disease, unspecified, with unspecified complications: Secondary | ICD-10-CM | POA: Diagnosis not present

## 2021-03-14 DIAGNOSIS — I10 Essential (primary) hypertension: Secondary | ICD-10-CM | POA: Diagnosis not present

## 2021-03-14 DIAGNOSIS — K219 Gastro-esophageal reflux disease without esophagitis: Secondary | ICD-10-CM

## 2021-03-14 DIAGNOSIS — F419 Anxiety disorder, unspecified: Secondary | ICD-10-CM

## 2021-03-14 DIAGNOSIS — I872 Venous insufficiency (chronic) (peripheral): Secondary | ICD-10-CM

## 2021-03-14 DIAGNOSIS — M810 Age-related osteoporosis without current pathological fracture: Secondary | ICD-10-CM

## 2021-03-14 DIAGNOSIS — Z13220 Encounter for screening for lipoid disorders: Secondary | ICD-10-CM

## 2021-03-14 NOTE — Progress Notes (Addendum)
Patient ID: Lauren Chang, female   DOB: 02-04-1956, 66 y.o.   MRN: [id number]   Subjective:    Patient ID: Lauren Chang, female    DOB: [birth year], 66 y.o.   MRN: [id number]  This visit occurred during the SARS-CoV-2 public health emergency.  Safety protocols were in place, including screening questions prior to the visit, additional usage of staff PPE, and extensive cleaning of exam room while observing appropriate contact time as indicated for disinfecting solutions.   Patient here for a scheduled follow up.  Marland Kitchen   HPI Here to follow up regarding her blood pressure and crohn's disease.  Saw Dr [facility]efm [facility] - s/p left thumb injection.  Tries to stay active.  Increased stress at work.  Discussed.  No chest pain or sob reported.  Eating.  No nausea or vomiting.  Continues on humira.  Has noticed some itching after.  Discussed.  Taking benadryl.  Has f/u later this month - with GI.  Plans to discuss.  Compression hose.     Past Medical History:  Diagnosis Date   Chlamydia    Crohn's disease ([place])    large intestine   GERD (gastroesophageal reflux disease)    Hypertension    Past Surgical History:  Procedure Laterality Date   COLONOSCOPY WITH PROPOFOL N/A 01/27/2020   Procedure: COLONOSCOPY WITH PROPOFOL;  Surgeon: Lesly Rubenstein, MD;  Location: [facility] ENDOSCOPY;  Service: Endoscopy;  Laterality: N/A;   laparoscopy and tuba repair     orif right calcaneus     TONSILECTOMY/ADENOIDECTOMY WITH MYRINGOTOMY     Family History  Problem Relation Age of Onset   Lymphoma Father    Asthma Mother    Osteoporosis Mother    Mental illness Brother        suicide   Pancreatic cancer Other        uncle   COPD Maternal Grandmother    Breast cancer Neg Hx    Colon cancer Neg Hx    Social History   Socioeconomic History   Marital status: Widowed    Spouse name: Not on file   Number of children: 0   Years of education: Not on file   Highest education level: Not on file  Occupational  History   Not on file  Tobacco Use   Smoking status: Former    Types: Cigarettes    Quit date: 03/11/1999    Years since quitting: 22.0   Smokeless tobacco: Never  Vaping Use   Vaping Use: Never used  Substance and Sexual Activity   Alcohol use: No    Alcohol/week: 0.0 standard drinks   Drug use: No   Sexual activity: Not on file  Other Topics Concern   Not on file  Social History Narrative   Not on file   Social Determinants of Health   Financial Resource Strain: Not on file  Food Insecurity: Not on file  Transportation Needs: Not on file  Physical Activity: Not on file  Stress: Not on file  Social Connections: Not on file     Review of Systems  Constitutional:  Negative for appetite change and unexpected weight change.  HENT:  Negative for congestion and sinus pressure.   Respiratory:  Negative for cough, chest tightness and shortness of breath.   Cardiovascular:  Negative for chest pain, palpitations and leg swelling.  Gastrointestinal:  Negative for abdominal pain, diarrhea, nausea and vomiting.  Genitourinary:  Negative for difficulty urinating and dysuria.  Musculoskeletal:  Negative for joint  swelling and myalgias.  Skin:  Negative for color change and rash.  Neurological:  Negative for dizziness, light-headedness and headaches.  Psychiatric/Behavioral:  Negative for agitation and dysphoric mood.       Objective:     BP 126/70    Pulse 89    Temp 97.6 F (36.4 C)    Resp 16    Ht 5' 5"  (1.651 m)    Wt 152 lb 9.6 oz (69.2 kg)    LMP 04/12/2010    SpO2 98%    BMI 25.39 kg/m  Wt Readings from Last 3 Encounters:  03/14/21 152 lb 9.6 oz (69.2 kg)  01/21/21 148 lb 6.4 oz (67.3 kg)  11/08/20 151 lb (68.5 kg)    Physical Exam Vitals reviewed.  Constitutional:      General: She is not in acute distress.    Appearance: Normal appearance.  HENT:     Head: Normocephalic and atraumatic.     Right Ear: External ear normal.     Left Ear: External ear normal.   Eyes:     General: No scleral icterus.       Right eye: No discharge.        Left eye: No discharge.     Conjunctiva/sclera: Conjunctivae normal.  Neck:     Thyroid: No thyromegaly.  Cardiovascular:     Rate and Rhythm: Normal rate and regular rhythm.  Pulmonary:     Effort: No respiratory distress.     Breath sounds: Normal breath sounds. No wheezing.  Abdominal:     General: Bowel sounds are normal.     Palpations: Abdomen is soft.     Tenderness: There is no abdominal tenderness.  Musculoskeletal:        General: No swelling or tenderness.     Cervical back: Neck supple. No tenderness.  Lymphadenopathy:     Cervical: No cervical adenopathy.  Skin:    Findings: No erythema or rash.  Neurological:     Mental Status: She is alert.  Psychiatric:        Mood and Affect: Mood normal.        Behavior: Behavior normal.     Outpatient Encounter Medications as of 03/14/2021  Medication Sig   Adalimumab (HUMIRA) 40 MG/0.4ML PSKT Day 1 inject 2 pens SQ, Day 15 inject 1 pen SQ & Day 29 inject 1 pen SQ   B Complex Vitamins (B COMPLEX 50) TABS Take 1 capsule by mouth daily.   buPROPion (WELLBUTRIN XL) 150 MG 24 hr tablet TAKE 1 TABLET BY MOUTH ONCE DAILY.   cholecalciferol (VITAMIN D) 400 UNITS TABS tablet Take 400 Units by mouth 3 (three) times a week.   fluticasone (FLONASE) 50 MCG/ACT nasal spray 2 sprays each nostril one time per day (in pm).   hydrochlorothiazide (MICROZIDE) 12.5 MG capsule TAKE (1) CAPSULE BY MOUTH ONCE DAILY.   Multiple Vitamins-Minerals (ALGAE BASED CALCIUM) TABS Take 1 tablet by mouth daily.   pantoprazole (PROTONIX) 40 MG tablet Take 1 tablet (40 mg total) by mouth daily.   valACYclovir (VALTREX) 1000 MG tablet Use as directed.   zoledronic acid (RECLAST) 5 MG/100ML SOLN injection    zolpidem (AMBIEN) 5 MG tablet TAKE 1/2 TABLET BY MOUTH AT BEDTIME AS NEEDED.   [DISCONTINUED] HUMIRA PEN-CD/UC/HS STARTER 80 MG/0.8ML PNKT Inject into the skin.   No  facility-administered encounter medications on file as of 03/14/2021.     Lab Results  Component Value Date   WBC 6.8 11/08/2020  HGB 13.7 11/08/2020   HCT 41.7 11/08/2020   PLT 246.0 11/08/2020   GLUCOSE 88 11/08/2020   CHOL 196 11/08/2020   TRIG 144.0 11/08/2020   HDL 56.50 11/08/2020   LDLCALC 111 (H) 11/08/2020   ALT 27 11/08/2020   AST 23 11/08/2020   NA 141 11/08/2020   K 4.0 11/08/2020   CL 100 11/08/2020   CREATININE 0.78 11/08/2020   BUN 15 11/08/2020   CO2 32 11/08/2020   TSH 0.90 11/08/2020    MM 3D SCREEN BREAST BILATERAL  Result Date: 03/05/2021 CLINICAL DATA:  Screening. EXAM: DIGITAL SCREENING BILATERAL MAMMOGRAM WITH TOMOSYNTHESIS AND CAD TECHNIQUE: Bilateral screening digital craniocaudal and mediolateral oblique mammograms were obtained. Bilateral screening digital breast tomosynthesis was performed. The images were evaluated with computer-aided detection. COMPARISON:  Previous exam(s). ACR Breast Density Category c: The breast tissue is heterogeneously dense, which may obscure small masses. FINDINGS: There are no findings suspicious for malignancy. IMPRESSION: No mammographic evidence of malignancy. A result letter of this screening mammogram will be mailed directly to the patient. RECOMMENDATION: Screening mammogram in one year. (Code:SM-B-01Y) BI-RADS CATEGORY  1: Negative. Electronically Signed   By: Lajean Manes M.D.   On: 03/05/2021 09:55      Assessment & Plan:   Problem List Items Addressed This Visit     Anxiety    Increased stress.  Increased stress with work.  On wellbutrin.  Notify me if feels needs any further intervention.  Follow.       Chronic venous insufficiency    Compression hose.        Crohn's disease ([place])    Colonoscopy 01/2020 - pathology - chronic colitis - ascending, transverse.  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  Recommended f/u colonoscopy in 1-2 years.  Continue f/u with GI.  Bowels stable.  On  humira. Itching as outlined.  Taking benadryl.  Plans to discuss with GI.       Relevant Orders   CBC with Differential/Platelet   GERD (gastroesophageal reflux disease)    No upper symptoms reported.  On protonix.        Hypertension, essential    Continue hctz.  Blood pressure as outlined.  Have her follow pressures.  Follow metabolic panel.       Relevant Orders   Basic metabolic panel   Osteoporosis    Continue calcium, vitamin D and weight bearing exercise.  Planning for reclast today.       Relevant Medications   zoledronic acid (RECLAST) 5 MG/100ML SOLN injection   Other Visit Diagnoses     Screening cholesterol level       Relevant Orders   Lipid panel   Hepatic function panel        [name], MD

## 2021-03-18 ENCOUNTER — Encounter: Payer: Self-pay | Admitting: Internal Medicine

## 2021-03-18 NOTE — Addendum Note (Signed)
Addended by: Alisa Graff on: 03/18/2021 04:07 AM   Modules accepted: Orders

## 2021-03-18 NOTE — Assessment & Plan Note (Signed)
Continue hctz.  Blood pressure as outlined.  Have her follow pressures.  Follow metabolic panel.

## 2021-03-18 NOTE — Assessment & Plan Note (Signed)
No upper symptoms reported.  On protonix.

## 2021-03-18 NOTE — Assessment & Plan Note (Signed)
Continue calcium, vitamin D and weight bearing exercise.  Planning for reclast today.

## 2021-03-18 NOTE — Assessment & Plan Note (Addendum)
Colonoscopy 01/2020 - pathology - chronic colitis - ascending, transverse.  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  Recommended f/u colonoscopy in 1-2 years.  Continue f/u with GI.  Bowels stable.  On humira. Itching as outlined.  Taking benadryl.  Plans to discuss with GI.

## 2021-03-18 NOTE — Assessment & Plan Note (Signed)
Increased stress.  Increased stress with work.  On wellbutrin.  Notify me if feels needs any further intervention.  Follow.

## 2021-03-18 NOTE — Assessment & Plan Note (Signed)
Compression hose.   

## 2021-07-08 ENCOUNTER — Encounter: Payer: Self-pay | Admitting: *Deleted

## 2021-07-09 ENCOUNTER — Encounter
Admission: RE | Disposition: A | Discharge status: Home / Self Care | Payer: Self-pay | Source: Home / Self Care | Attending: Gastroenterology

## 2021-07-09 ENCOUNTER — Ambulatory Visit: Payer: Managed Care, Other (non HMO) | Admitting: Anesthesiology

## 2021-07-09 ENCOUNTER — Encounter: Payer: Self-pay | Admitting: *Deleted

## 2021-07-09 ENCOUNTER — Ambulatory Visit
Admission: RE | Admit: 2021-07-09 | Discharge: 2021-07-09 | Disposition: A | Discharge status: Home / Self Care | Payer: Managed Care, Other (non HMO) | Attending: Gastroenterology | Admitting: Gastroenterology

## 2021-07-09 ENCOUNTER — Other Ambulatory Visit: Payer: Self-pay

## 2021-07-09 DIAGNOSIS — K6389 Other specified diseases of intestine: Secondary | ICD-10-CM | POA: Diagnosis not present

## 2021-07-09 DIAGNOSIS — K219 Gastro-esophageal reflux disease without esophagitis: Secondary | ICD-10-CM | POA: Insufficient documentation

## 2021-07-09 DIAGNOSIS — K635 Polyp of colon: Secondary | ICD-10-CM | POA: Insufficient documentation

## 2021-07-09 DIAGNOSIS — K5090 Crohn's disease, unspecified, without complications: Secondary | ICD-10-CM | POA: Insufficient documentation

## 2021-07-09 DIAGNOSIS — K5730 Diverticulosis of large intestine without perforation or abscess without bleeding: Secondary | ICD-10-CM | POA: Insufficient documentation

## 2021-07-09 DIAGNOSIS — I10 Essential (primary) hypertension: Secondary | ICD-10-CM | POA: Diagnosis not present

## 2021-07-09 DIAGNOSIS — Z87891 Personal history of nicotine dependence: Secondary | ICD-10-CM | POA: Diagnosis not present

## 2021-07-09 HISTORY — DX: Anxiety disorder, unspecified: F41.9

## 2021-07-09 HISTORY — DX: Other specified disorders of bone density and structure, unspecified site: M85.80

## 2021-07-09 HISTORY — PX: COLONOSCOPY WITH PROPOFOL: SHX5780

## 2021-07-09 SURGERY — COLONOSCOPY WITH PROPOFOL
Anesthesia: General

## 2021-07-09 MED ORDER — SODIUM CHLORIDE 0.9 % IV SOLN: INTRAVENOUS | Status: DC

## 2021-07-09 MED ORDER — PROPOFOL 500 MG/50ML IV EMUL
INTRAVENOUS | Status: DC | PRN
  Administered 2021-07-09: 140 ug/kg/min via INTRAVENOUS

## 2021-07-09 MED ORDER — LIDOCAINE HCL (CARDIAC) PF 100 MG/5ML IV SOSY
PREFILLED_SYRINGE | INTRAVENOUS | Status: DC | PRN
Start: 2021-07-09 — End: 2021-07-09
  Administered 2021-07-09: 60 mg via INTRAVENOUS

## 2021-07-09 MED ORDER — PROPOFOL 10 MG/ML IV BOLUS
INTRAVENOUS | Status: DC | PRN
  Administered 2021-07-09: 70 mg via INTRAVENOUS

## 2021-07-09 NOTE — Transfer of Care (Signed)
Immediate Anesthesia Transfer of Care Note ? ?Patient: Lauren Chang ? ?Procedure(s) Performed: COLONOSCOPY WITH PROPOFOL ? ?Patient Location: PACU ? ?Anesthesia Type:General ? ?Level of Consciousness: awake, alert  and oriented ? ?Airway & Oxygen Therapy: Patient Spontanous Breathing ? ?Post-op Assessment: Report given to RN and Post -op Vital signs reviewed and stable ? ?Post vital signs: Reviewed and stable ? ?Last Vitals:  ?Vitals Value Taken Time  ?BP 123/72 07/09/21 0819  ?Temp    ?Pulse 81 07/09/21 0819  ?Resp 11 07/09/21 0819  ?SpO2 99 % 07/09/21 0819  ? ? ?Last Pain:  ?Vitals:  ? 07/09/21 0723  ?TempSrc: Temporal  ?PainSc: 0-No pain  ?   ? ?  ? ?Complications: No notable events documented. ?

## 2021-07-09 NOTE — Anesthesia Preprocedure Evaluation (Signed)
Anesthesia Evaluation  ?Patient identified by MRN, date of birth, ID band ?Patient awake ? ? ? ?Reviewed: ?Allergy & Precautions, NPO status , Patient's Chart, lab work & pertinent test results ? ?Airway ?Mallampati: III ? ?TM Distance: >3 FB ?Neck ROM: full ? ? ? Dental ? ?(+) Chipped ?  ?Pulmonary ?neg shortness of breath, former smoker,  ?  ?Pulmonary exam normal ? ? ? ? ? ? ? Cardiovascular ?Exercise Tolerance: Good ?hypertension, (-) angina(-) DOE Normal cardiovascular exam ? ? ?  ?Neuro/Psych ?PSYCHIATRIC DISORDERS negative neurological ROS ?   ? GI/Hepatic ?Neg liver ROS, GERD  Controlled,  ?Endo/Other  ?negative endocrine ROS ? Renal/GU ?negative Renal ROS  ?negative genitourinary ?  ?Musculoskeletal ? ? Abdominal ?  ?Peds ? Hematology ?negative hematology ROS ?(+)   ?Anesthesia Other Findings ?Past Medical History: ?No date: Anxiety ?No date: Chlamydia ?No date: Crohn's disease ([place]) ?    Comment:  large intestine ?No date: GERD (gastroesophageal reflux disease) ?No date: Hypertension ?No date: Osteopenia ? ?Past Surgical History: ?01/27/2020: COLONOSCOPY WITH PROPOFOL; N/A ?    Comment:  Procedure: COLONOSCOPY WITH PROPOFOL;  Surgeon:  ?             Lesly Rubenstein, MD;  Location: [facility] ENDOSCOPY;   ?             Service: Endoscopy;  Laterality: N/A; ?No date: laparoscopy and tuba repair ?No date: orif right calcaneus ?No date: TONSILECTOMY/ADENOIDECTOMY WITH MYRINGOTOMY ? ? ? ? Reproductive/Obstetrics ?negative OB ROS ? ?  ? ? ? ? ? ? ? ? ? ? ? ? ? ?  ?  ? ? ? ? ? ? ? ? ?Anesthesia Physical ?Anesthesia Plan ? ?ASA: 2 ? ?Anesthesia Plan: General  ? ?Post-op Pain Management:   ? ?Induction: Intravenous ? ?PONV Risk Score and Plan: Propofol infusion and TIVA ? ?Airway Management Planned: Natural Airway and Nasal Cannula ? ?Additional Equipment:  ? ?Intra-op Plan:  ? ?Post-operative Plan:  ? ?Informed Consent: I have reviewed the patients History and Physical, chart, labs  and discussed the procedure including the risks, benefits and alternatives for the proposed anesthesia with the patient or authorized representative who has indicated his/her understanding and acceptance.  ? ? ? ?Dental Advisory Given ? ?Plan Discussed with: Anesthesiologist, CRNA and Surgeon ? ?Anesthesia Plan Comments: (Patient consented for risks of anesthesia including but not limited to:  ?- adverse reactions to medications ?- risk of airway placement if required ?- damage to eyes, teeth, lips or other oral mucosa ?- nerve damage due to positioning  ?- sore throat or hoarseness ?- Damage to heart, brain, nerves, lungs, other parts of body or loss of life ? ?Patient voiced understanding.)  ? ? ? ? ? ? ?Anesthesia Quick Evaluation ? ?

## 2021-07-09 NOTE — H&P (Signed)
Outpatient short stay form Pre-procedure ?07/09/2021  ?Lesly Rubenstein, MD ? ?Primary Physician: [name], MD ? ?Reason for visit:  IBD Surveillance ? ?History of present illness:   ? ?66 y/o lady with colonic crohns here for surveillance colonoscopy. No blood thinners. No family history of GI issues. No significant abdominal surgeries. ? ? ? ?Current Facility-Administered Medications:  ?  0.9 %  sodium chloride infusion, , Intravenous, Continuous, Caragh Gasper, Hilton Cork, MD, Last Rate: 20 mL/hr at 07/09/21 0734, New Bag at 07/09/21 0734 ? ?Medications Prior to Admission  ?Medication Sig Dispense Refill Last Dose  ? hydrochlorothiazide (MICROZIDE) 12.5 MG capsule TAKE (1) CAPSULE BY MOUTH ONCE DAILY. 30 capsule 11 07/09/2021  ? Adalimumab (HUMIRA) 40 MG/0.4ML PSKT Day 1 inject 2 pens SQ, Day 15 inject 1 pen SQ & Day 29 inject 1 pen SQ     ? B Complex Vitamins (B COMPLEX 50) TABS Take 1 capsule by mouth daily.     ? buPROPion (WELLBUTRIN XL) 150 MG 24 hr tablet TAKE 1 TABLET BY MOUTH ONCE DAILY. 30 tablet 0   ? cholecalciferol (VITAMIN D) 400 UNITS TABS tablet Take 400 Units by mouth 3 (three) times a week.     ? fluticasone (FLONASE) 50 MCG/ACT nasal spray 2 sprays each nostril one time per day (in pm). 16 g 2   ? Multiple Vitamins-Minerals (ALGAE BASED CALCIUM) TABS Take 1 tablet by mouth daily.     ? pantoprazole (PROTONIX) 40 MG tablet Take 1 tablet (40 mg total) by mouth daily. 30 tablet 3   ? valACYclovir (VALTREX) 1000 MG tablet Use as directed. 30 tablet 0   ? zoledronic acid (RECLAST) 5 MG/100ML SOLN injection      ? zolpidem (AMBIEN) 5 MG tablet TAKE 1/2 TABLET BY MOUTH AT BEDTIME AS NEEDED. 15 tablet 1   ? ? ? ?Allergies  ?Allergen Reactions  ? Bactrim [Sulfamethoxazole-Trimethoprim] Nausea Only  ? Ciprofloxacin Other (See Comments)  ?  Arthralgias ?  ? Ibuprofen Rash  ? Oxycodone Rash  ? Oxycontin [Oxycodone Hcl] Rash  ? ? ? ?Past Medical History:  ?Diagnosis Date  ? Anxiety   ? Chlamydia   ? Crohn's  disease ([place])   ? large intestine  ? GERD (gastroesophageal reflux disease)   ? Hypertension   ? Osteopenia   ? ? ?Review of systems:  Otherwise negative.  ? ? ?Physical Exam ? ?Gen: Alert, oriented. Appears stated age.  ?HEENT: PERRLA. ?Lungs: No respiratory distress ?CV: RRR ?Abd: soft, benign, no masses ?Ext: No edema ? ? ? ?Planned procedures: Proceed with colonoscopy. The patient understands the nature of the planned procedure, indications, risks, alternatives and potential complications including but not limited to bleeding, infection, perforation, damage to internal organs and possible oversedation/side effects from anesthesia. The patient agrees and gives consent to proceed.  ?Please refer to procedure notes for findings, recommendations and patient disposition/instructions.  ? ? ? ?Lesly Rubenstein, MD ?[facility] ? ? ? ?  ? ?

## 2021-07-09 NOTE — Anesthesia Postprocedure Evaluation (Signed)
Anesthesia Post Note ? ?Patient: Lauren Chang ? ?Procedure(s) Performed: COLONOSCOPY WITH PROPOFOL ? ?Patient location during evaluation: Endoscopy ?Anesthesia Type: General ?Level of consciousness: awake and alert ?Pain management: pain level controlled ?Vital Signs Assessment: post-procedure vital signs reviewed and stable ?Respiratory status: spontaneous breathing, nonlabored ventilation, respiratory function stable and patient connected to nasal cannula oxygen ?Cardiovascular status: blood pressure returned to baseline and stable ?Postop Assessment: no apparent nausea or vomiting ?Anesthetic complications: no ? ? ?No notable events documented. ? ? ?Last Vitals:  ?Vitals:  ? 07/09/21 0830 07/09/21 0840  ?BP: 114/85 137/86  ?Pulse: 83 82  ?Resp: 14 (!) 24  ?Temp:    ?SpO2: 100% 100%  ?  ?Last Pain:  ?Vitals:  ? 07/09/21 0810  ?TempSrc: Temporal  ?PainSc:   ? ? ?  ?  ?  ?  ?  ?  ? ?Precious Haws Ayame Rena ? ? ? ? ?

## 2021-07-09 NOTE — Op Note (Signed)
[facility] ?Gastroenterology ?Patient Name: Lauren Chang ?Procedure Date: 07/09/2021 7:14 AM ?MRN: [id number] ?Account #: [id number] ?Date of Birth: 1956-02-24 ?Admit Type: Outpatient ?Age: 66 ?Room: [facility] ENDO ROOM 1 ?Gender: Female ?Note Status: Finalized ?Instrument Name: Colonscope [id number] ?Procedure:             Colonoscopy ?Indications:           Crohn's disease ?Providers:             Andrey Farmer MD, MD ?Referring MD:          [name], MD (Referring MD) ?Medicines:             Monitored Anesthesia Care ?Complications:         No immediate complications. Estimated blood loss:  ?                       Minimal. ?Procedure:             Pre-Anesthesia Assessment: ?                       - Prior to the procedure, a History and Physical was  ?                       performed, and patient medications and allergies were  ?                       reviewed. The patient is competent. The risks and  ?                       benefits of the procedure and the sedation options and  ?                       risks were discussed with the patient. All questions  ?                       were answered and informed consent was obtained.  ?                       Patient identification and proposed procedure were  ?                       verified by the physician, the nurse, the  ?                       anesthesiologist, the anesthetist and the technician  ?                       in the endoscopy suite. Mental Status Examination:  ?                       alert and oriented. Airway Examination: normal  ?                       oropharyngeal airway and neck mobility. Respiratory  ?                       Examination: clear to auscultation. CV Examination:  ?  normal. Prophylactic Antibiotics: The patient does not  ?                       require prophylactic antibiotics. Prior  ?                       Anticoagulants: The patient has taken no previous  ?                       anticoagulant  or antiplatelet agents. ASA Grade  ?                       Assessment: II - A patient with mild systemic disease.  ?                       After reviewing the risks and benefits, the patient  ?                       was deemed in satisfactory condition to undergo the  ?                       procedure. The anesthesia plan was to use monitored  ?                       anesthesia care (MAC). Immediately prior to  ?                       administration of medications, the patient was  ?                       re-assessed for adequacy to receive sedatives. The  ?                       heart rate, respiratory rate, oxygen saturations,  ?                       blood pressure, adequacy of pulmonary ventilation, and  ?                       response to care were monitored throughout the  ?                       procedure. The physical status of the patient was  ?                       re-assessed after the procedure. ?                       After obtaining informed consent, the colonoscope was  ?                       passed under direct vision. Throughout the procedure,  ?                       the patient's blood pressure, pulse, and oxygen  ?                       saturations were monitored continuously. The  ?  Colonoscope was introduced through the anus and  ?                       advanced to the the terminal ileum. The colonoscopy  ?                       was somewhat difficult due to significant looping.  ?                       Successful completion of the procedure was aided by  ?                       applying abdominal pressure. The patient tolerated the  ?                       procedure well. The quality of the bowel preparation  ?                       was good. ?Findings: ?     The perianal and digital rectal examinations were normal. ?     The terminal ileum contained a few patchy non-bleeding aphthae. No  ?     stigmata of recent bleeding were seen. Biopsies were taken with a cold  ?      forceps for histology. Estimated blood loss was minimal. ?     Multiple small and large-mouthed diverticula were found in the sigmoid  ?     colon, descending colon, transverse colon, hepatic flexure and ascending  ?     colon. ?     Patchy pseudopolyps were found in the recto-sigmoid colon, in the  ?     sigmoid colon, in the descending colon and in the transverse colon. ?     Normal mucosa was found in the entire colon. Biopsies were taken with a  ?     cold forceps for histology. Estimated blood loss was minimal. ?     The exam was otherwise without abnormality on direct and retroflexion  ?     views. ?Impression:            - Aphtha in the terminal ileum. Biopsied. ?                       - Diverticulosis in the sigmoid colon, in the  ?                       descending colon, in the transverse colon, at the  ?                       hepatic flexure and in the ascending colon. ?                       - Pseudopolyps in the recto-sigmoid colon, in the  ?                       sigmoid colon, in the descending colon and in the  ?                       transverse colon. ?                       -  Normal mucosa in the entire examined colon. Biopsied. ?                       - The examination was otherwise normal on direct and  ?                       retroflexion views. ?Recommendation:        - Discharge patient to home. ?                       - Resume previous diet. ?                       - Continue present medications. ?                       - Await pathology results. ?                       - Repeat colonoscopy in 3 years for surveillance. ?                       - Return to referring physician as previously  ?                       scheduled. ?Procedure Code(s):     --- Professional --- ?                       [phone], Colonoscopy, flexible; with biopsy, single or  ?                       multiple ?Diagnosis Code(s):     --- Professional --- ?                       K63.89, Other specified diseases of intestine ?                        K51.40, Inflammatory polyps of colon without  ?                       complications ?                       K50.90, Crohn's disease, unspecified, without  ?                       complications ?                       K57.30, Diverticulosis of large intestine without  ?                       perforation or abscess without bleeding ?CPT copyright 2019 American Medical Association. All rights reserved. ?The codes documented in this report are preliminary and upon coder review may  ?be revised to meet current compliance requirements. ?Andrey Farmer MD, MD ?07/09/2021 8:20:16 AM ?Number of Addenda: 0 ?Note Initiated On: 07/09/2021 7:14 AM ?Scope Withdrawal Time: 0 hours 13 minutes 16 seconds  ?Total Procedure Duration: 0 hours 18 minutes 51 seconds  ?Estimated Blood Loss:  Estimated blood loss was minimal. ?     [facility] ?

## 2021-07-09 NOTE — Interval H&P Note (Signed)
History and Physical Interval Note: ? ?07/09/2021 ?7:51 AM ? ?Lauren Chang  has presented today for surgery, with the diagnosis of Chroh's disease.  The various methods of treatment have been discussed with the patient and family. After consideration of risks, benefits and other options for treatment, the patient has consented to  Procedure(s): ?COLONOSCOPY WITH PROPOFOL (N/A) as a surgical intervention.  The patient's history has been reviewed, patient examined, no change in status, stable for surgery.  I have reviewed the patient's chart and labs.  Questions were answered to the patient's satisfaction.   ? ? ?[name] ? ?Ok to proceed with colonoscopy ?

## 2021-07-10 ENCOUNTER — Encounter: Payer: Self-pay | Admitting: Gastroenterology

## 2021-07-10 LAB — SURGICAL PATHOLOGY

## 2021-09-12 ENCOUNTER — Encounter: Payer: Self-pay | Admitting: Internal Medicine

## 2021-09-13 NOTE — Telephone Encounter (Signed)
Please confirm our chart is updated negative hepatits c antibody 02/2020

## 2021-09-16 ENCOUNTER — Encounter: Payer: Self-pay | Admitting: Internal Medicine

## 2021-09-16 ENCOUNTER — Ambulatory Visit (INDEPENDENT_AMBULATORY_CARE_PROVIDER_SITE_OTHER): Payer: Managed Care, Other (non HMO) | Admitting: Internal Medicine

## 2021-09-16 VITALS — BP 120/74 | HR 76 | Temp 98.9°F | Resp 14 | Ht 65.0 in | Wt 158.0 lb

## 2021-09-16 DIAGNOSIS — K50919 Crohn's disease, unspecified, with unspecified complications: Secondary | ICD-10-CM | POA: Diagnosis not present

## 2021-09-16 DIAGNOSIS — I10 Essential (primary) hypertension: Secondary | ICD-10-CM

## 2021-09-16 DIAGNOSIS — M810 Age-related osteoporosis without current pathological fracture: Secondary | ICD-10-CM

## 2021-09-16 DIAGNOSIS — G479 Sleep disorder, unspecified: Secondary | ICD-10-CM

## 2021-09-16 DIAGNOSIS — F419 Anxiety disorder, unspecified: Secondary | ICD-10-CM

## 2021-09-16 DIAGNOSIS — K219 Gastro-esophageal reflux disease without esophagitis: Secondary | ICD-10-CM

## 2021-09-16 DIAGNOSIS — Z1231 Encounter for screening mammogram for malignant neoplasm of breast: Secondary | ICD-10-CM

## 2021-09-16 DIAGNOSIS — I872 Venous insufficiency (chronic) (peripheral): Secondary | ICD-10-CM | POA: Diagnosis not present

## 2021-09-16 LAB — BASIC METABOLIC PANEL
BUN: 17 mg/dL (ref 6–23)
CO2: 29 mEq/L (ref 19–32)
Calcium: 10.1 mg/dL (ref 8.4–10.5)
Chloride: 100 mEq/L (ref 96–112)
Creatinine, Ser: 0.82 mg/dL (ref 0.40–1.20)
GFR: 75.16 mL/min (ref >60.00)
Glucose, Bld: 96 mg/dL (ref 70–99)
Potassium: 3.9 mEq/L (ref 3.5–5.1)
Sodium: 138 mEq/L (ref 135–145)

## 2021-09-16 LAB — CBC WITH DIFFERENTIAL/PLATELET
Basophils Absolute: 0 10*3/uL (ref 0.0–0.1)
Basophils Relative: 0.6 % (ref 0.0–3.0)
Eosinophils Absolute: 0.3 10*3/uL (ref 0.0–0.7)
Eosinophils Relative: 3.8 % (ref 0.0–5.0)
HCT: 42.4 % (ref 36.0–46.0)
Hemoglobin: 13.9 g/dL (ref 12.0–15.0)
Lymphocytes Relative: 33.9 % (ref 12.0–46.0)
Lymphs Abs: 2.3 10*3/uL (ref 0.7–4.0)
MCHC: 32.7 g/dL (ref 30.0–36.0)
MCV: 90.8 fl (ref 78.0–100.0)
Monocytes Absolute: 0.6 10*3/uL (ref 0.1–1.0)
Monocytes Relative: 9.5 % (ref 3.0–12.0)
Neutro Abs: 3.6 10*3/uL (ref 1.4–7.7)
Neutrophils Relative %: 52.2 % (ref 43.0–77.0)
Platelets: 236 10*3/uL (ref 150.0–400.0)
RBC: 4.67 Mil/uL (ref 3.87–5.11)
RDW: 13.4 % (ref 11.5–15.5)
WBC: 6.8 10*3/uL (ref 4.0–10.5)

## 2021-09-16 LAB — HEPATIC FUNCTION PANEL
ALT: 23 U/L (ref 0–35)
AST: 23 U/L (ref 0–37)
Albumin: 4.6 g/dL (ref 3.5–5.2)
Alkaline Phosphatase: 55 U/L (ref 39–117)
Bilirubin, Direct: 0.1 mg/dL (ref 0.0–0.3)
Total Bilirubin: 0.8 mg/dL (ref 0.2–1.2)
Total Protein: 7.3 g/dL (ref 6.0–8.3)

## 2021-09-16 LAB — LIPID PANEL
Cholesterol: 198 mg/dL (ref 0–200)
HDL: 58.9 mg/dL (ref >39.00)
LDL Cholesterol: 119 mg/dL — ABNORMAL HIGH (ref 0–99)
NonHDL: 138.79
Total CHOL/HDL Ratio: 3
Triglycerides: 97 mg/dL (ref 0.0–149.0)
VLDL: 19.4 mg/dL (ref 0.0–40.0)

## 2021-09-16 LAB — TSH: TSH: 1.42 u[IU]/mL (ref 0.35–5.50)

## 2021-09-16 MED ORDER — VALACYCLOVIR HCL 1 G PO TABS: ORAL_TABLET | ORAL | 0 refills | Status: DC

## 2021-09-16 NOTE — Progress Notes (Signed)
Patient ID: Lauren Chang, female   DOB: 1955/12/09, 66 y.o.   MRN: [id number]   Subjective:    Patient ID: Lauren Chang, female    DOB: 1955/09/29, 66 y.o.   MRN: [id number]   Patient here for a scheduled follow up.   Chief Complaint  Patient presents with   Stress   .   HPI Increased stress.  No longer working.  Discussed evens surrounding her retirement.  Does not feel needs any further intervention.  No chest pain or sob reported.  No increased abdominal pain.  Bowels stable.  Sees GI - f/u Crohn's.  Due colonoscopy 2026.     Past Medical History:  Diagnosis Date   Anxiety    Chlamydia    Crohn's disease ([place])    large intestine   GERD (gastroesophageal reflux disease)    Hypertension    Osteopenia    Past Surgical History:  Procedure Laterality Date   COLONOSCOPY WITH PROPOFOL N/A 01/27/2020   Procedure: COLONOSCOPY WITH PROPOFOL;  Surgeon: Lesly Rubenstein, MD;  Location: [facility] ENDOSCOPY;  Service: Endoscopy;  Laterality: N/A;   COLONOSCOPY WITH PROPOFOL N/A 07/09/2021   Procedure: COLONOSCOPY WITH PROPOFOL;  Surgeon: Lesly Rubenstein, MD;  Location: [facility] ENDOSCOPY;  Service: Endoscopy;  Laterality: N/A;   laparoscopy and tuba repair     orif right calcaneus     TONSILECTOMY/ADENOIDECTOMY WITH MYRINGOTOMY     Family History  Problem Relation Age of Onset   Lymphoma Father    Asthma Mother    Osteoporosis Mother    Mental illness Brother        suicide   Pancreatic cancer Other        uncle   COPD Maternal Grandmother    Breast cancer Neg Hx    Colon cancer Neg Hx    Social History   Socioeconomic History   Marital status: Widowed    Spouse name: Not on file   Number of children: 0   Years of education: Not on file   Highest education level: Not on file  Occupational History   Not on file  Tobacco Use   Smoking status: Former    Types: Cigarettes    Quit date: 02/09/2002    Years since quitting: 19.6   Smokeless tobacco: Never  Vaping  Use   Vaping Use: Never used  Substance and Sexual Activity   Alcohol use: No    Alcohol/week: 0.0 standard drinks of alcohol   Drug use: No   Sexual activity: Not on file  Other Topics Concern   Not on file  Social History Narrative   Not on file   Social Determinants of Health   Financial Resource Strain: Not on file  Food Insecurity: Not on file  Transportation Needs: Not on file  Physical Activity: Not on file  Stress: Not on file  Social Connections: Not on file     Review of Systems  Constitutional:  Negative for appetite change and unexpected weight change.  HENT:  Negative for congestion and sinus pressure.   Respiratory:  Negative for cough, chest tightness and shortness of breath.   Cardiovascular:  Negative for chest pain, palpitations and leg swelling.  Gastrointestinal:  Negative for abdominal pain, diarrhea, nausea and vomiting.  Genitourinary:  Negative for difficulty urinating and dysuria.  Musculoskeletal:  Negative for joint swelling and myalgias.  Skin:  Negative for color change and rash.  Neurological:  Negative for dizziness, light-headedness and headaches.  Psychiatric/Behavioral:  Negative  for agitation and dysphoric mood.        Objective:     BP 120/74 (BP Location: Left Arm, Patient Position: Sitting, Cuff Size: Small)   Pulse 76   Temp 98.9 F (37.2 C) (Temporal)   Resp 14   Ht 5' 5"  (1.651 m)   Wt 158 lb (71.7 kg)   LMP 04/12/2010   SpO2 98%   BMI 26.29 kg/m  Wt Readings from Last 3 Encounters:  09/16/21 158 lb (71.7 kg)  07/09/21 150 lb (68 kg)  03/14/21 152 lb 9.6 oz (69.2 kg)    Physical Exam Vitals reviewed.  Constitutional:      General: She is not in acute distress.    Appearance: Normal appearance.  HENT:     Head: Normocephalic and atraumatic.     Right Ear: External ear normal.     Left Ear: External ear normal.  Eyes:     General: No scleral icterus.       Right eye: No discharge.        Left eye: No  discharge.     Conjunctiva/sclera: Conjunctivae normal.  Neck:     Thyroid: No thyromegaly.  Cardiovascular:     Rate and Rhythm: Normal rate and regular rhythm.  Pulmonary:     Effort: No respiratory distress.     Breath sounds: Normal breath sounds. No wheezing.  Abdominal:     General: Bowel sounds are normal.     Palpations: Abdomen is soft.     Tenderness: There is no abdominal tenderness.  Musculoskeletal:        General: No swelling or tenderness.     Cervical back: Neck supple. No tenderness.  Lymphadenopathy:     Cervical: No cervical adenopathy.  Skin:    Findings: No erythema or rash.  Neurological:     Mental Status: She is alert.  Psychiatric:        Mood and Affect: Mood normal.        Behavior: Behavior normal.      Outpatient Encounter Medications as of 09/16/2021  Medication Sig   Adalimumab (HUMIRA) 40 MG/0.4ML PSKT Day 1 inject 2 pens SQ, Day 15 inject 1 pen SQ & Day 29 inject 1 pen SQ   B Complex Vitamins (B COMPLEX 50) TABS Take 1 capsule by mouth daily.   buPROPion (WELLBUTRIN XL) 150 MG 24 hr tablet TAKE 1 TABLET BY MOUTH ONCE DAILY.   cholecalciferol (VITAMIN D) 400 UNITS TABS tablet Take 400 Units by mouth 3 (three) times a week.   fluticasone (FLONASE) 50 MCG/ACT nasal spray 2 sprays each nostril one time per day (in pm).   hydrochlorothiazide (MICROZIDE) 12.5 MG capsule TAKE (1) CAPSULE BY MOUTH ONCE DAILY.   Multiple Vitamins-Minerals (ALGAE BASED CALCIUM) TABS Take 1 tablet by mouth daily.   pantoprazole (PROTONIX) 40 MG tablet Take 1 tablet (40 mg total) by mouth daily.   zoledronic acid (RECLAST) 5 MG/100ML SOLN injection    zolpidem (AMBIEN) 5 MG tablet TAKE 1/2 TABLET BY MOUTH AT BEDTIME AS NEEDED.   [DISCONTINUED] valACYclovir (VALTREX) 1000 MG tablet Use as directed.   valACYclovir (VALTREX) 1000 MG tablet Use as directed.   No facility-administered encounter medications on file as of 09/16/2021.     Lab Results  Component Value Date    WBC 6.8 09/16/2021   HGB 13.9 09/16/2021   HCT 42.4 09/16/2021   PLT 236.0 09/16/2021   GLUCOSE 96 09/16/2021   CHOL 198 09/16/2021   TRIG  97.0 09/16/2021   HDL 58.90 09/16/2021   LDLCALC 119 (H) 09/16/2021   ALT 23 09/16/2021   AST 23 09/16/2021   NA 138 09/16/2021   K 3.9 09/16/2021   CL 100 09/16/2021   CREATININE 0.82 09/16/2021   BUN 17 09/16/2021   CO2 29 09/16/2021   TSH 1.42 09/16/2021       Assessment & Plan:   Problem List Items Addressed This Visit     Anxiety    Increased stress as outlined.  Retired now.  On wellbutrin.  Notify me if feels needs any further intervention.  Follow.       Breast cancer screening    Mammogram 03/01/21 - birads I.       Chronic venous insufficiency    Compression hose.       Crohn's disease ([place])    Colonoscopy 07/2021- hyperplastic polyp x 3.  Recommended f/u colonoscopy in 3 years.      Relevant Orders   Hepatic function panel (Completed)   CBC with Differential/Platelet (Completed)   CBC with Differential/Platelet   Hepatic function panel   IBC + Ferritin   GERD (gastroesophageal reflux disease)    No upper symptoms reported.  On protonix.        Hypertension, essential - Primary    Continue hctz.  Blood pressure as outlined.  Have her follow pressures.  Follow metabolic panel.       Relevant Orders   Lipid Profile (Completed)   Basic Metabolic Panel (BMET) (Completed)   TSH (Completed)   Hepatic function panel   Basic metabolic panel   Lipid panel   Osteoporosis    Continue calcium, vitamin D and weight bearing exercise.  Reclast 03/14/21.        Sleeping difficulty    Has ambien to take prn.  Follow.  Decreased caffeine.         [name], MD

## 2021-09-18 NOTE — Telephone Encounter (Signed)
Hep C recorded in chart

## 2021-09-22 ENCOUNTER — Encounter: Payer: Self-pay | Admitting: Internal Medicine

## 2021-09-22 NOTE — Assessment & Plan Note (Signed)
Mammogram 03/01/21 - birads I.

## 2021-09-22 NOTE — Assessment & Plan Note (Signed)
Has ambien to take prn.  Follow.  Decreased caffeine.

## 2021-09-22 NOTE — Assessment & Plan Note (Signed)
No upper symptoms reported.  On protonix.

## 2021-09-22 NOTE — Assessment & Plan Note (Signed)
Continue calcium, vitamin D and weight bearing exercise.  Reclast 03/14/21.

## 2021-09-22 NOTE — Assessment & Plan Note (Signed)
Continue hctz.  Blood pressure as outlined.  Have her follow pressures.  Follow metabolic panel.

## 2021-09-22 NOTE — Assessment & Plan Note (Signed)
Colonoscopy 07/2021- hyperplastic polyp x 3.  Recommended f/u colonoscopy in 3 years.

## 2021-09-22 NOTE — Assessment & Plan Note (Signed)
Compression hose.   

## 2021-09-22 NOTE — Assessment & Plan Note (Signed)
Increased stress as outlined.  Retired now.  On wellbutrin.  Notify me if feels needs any further intervention.  Follow.

## 2021-10-16 ENCOUNTER — Ambulatory Visit: Payer: Managed Care, Other (non HMO) | Admitting: Internal Medicine

## 2021-12-03 ENCOUNTER — Telehealth: Payer: Self-pay | Admitting: Internal Medicine

## 2021-12-03 NOTE — Telephone Encounter (Signed)
Pt need a refill on hydrochlorothiazide and buPROPion sent to [facility] 90 day supply

## 2021-12-04 MED ORDER — HYDROCHLOROTHIAZIDE 12.5 MG PO CAPS: ORAL_CAPSULE | ORAL | 5 refills | Status: DC

## 2021-12-04 MED ORDER — BUPROPION HCL ER (XL) 150 MG PO TB24: 150.0000 mg | ORAL_TABLET | Freq: Every day | ORAL | 3 refills | Status: DC

## 2021-12-04 NOTE — Telephone Encounter (Signed)
Rx sent in for prescriptions - hctx and wellbutrin.

## 2021-12-04 NOTE — Telephone Encounter (Signed)
LM that prescriptions sent. I asked she call back if any issues.

## 2021-12-18 ENCOUNTER — Encounter: Payer: Self-pay | Admitting: Internal Medicine

## 2021-12-18 ENCOUNTER — Ambulatory Visit (INDEPENDENT_AMBULATORY_CARE_PROVIDER_SITE_OTHER): Payer: Medicare Other | Admitting: Internal Medicine

## 2021-12-18 VITALS — BP 128/68 | HR 82 | Temp 97.4°F | Ht 65.0 in | Wt 154.4 lb

## 2021-12-18 DIAGNOSIS — I10 Essential (primary) hypertension: Secondary | ICD-10-CM

## 2021-12-18 DIAGNOSIS — I872 Venous insufficiency (chronic) (peripheral): Secondary | ICD-10-CM

## 2021-12-18 DIAGNOSIS — K219 Gastro-esophageal reflux disease without esophagitis: Secondary | ICD-10-CM | POA: Diagnosis not present

## 2021-12-18 DIAGNOSIS — F419 Anxiety disorder, unspecified: Secondary | ICD-10-CM

## 2021-12-18 DIAGNOSIS — M810 Age-related osteoporosis without current pathological fracture: Secondary | ICD-10-CM

## 2021-12-18 DIAGNOSIS — K50919 Crohn's disease, unspecified, with unspecified complications: Secondary | ICD-10-CM | POA: Diagnosis not present

## 2021-12-18 DIAGNOSIS — Z1231 Encounter for screening mammogram for malignant neoplasm of breast: Secondary | ICD-10-CM

## 2021-12-18 DIAGNOSIS — Z0000 Encounter for general adult medical examination without abnormal findings: Secondary | ICD-10-CM

## 2021-12-18 LAB — LIPID PANEL
Cholesterol: 211 mg/dL — ABNORMAL HIGH (ref 0–200)
HDL: 61.4 mg/dL (ref >39.00)
LDL Cholesterol: 124 mg/dL — ABNORMAL HIGH (ref 0–99)
NonHDL: 149.35
Total CHOL/HDL Ratio: 3
Triglycerides: 129 mg/dL (ref 0.0–149.0)
VLDL: 25.8 mg/dL (ref 0.0–40.0)

## 2021-12-18 LAB — IBC + FERRITIN
Ferritin: 22.6 ng/mL (ref 10.0–291.0)
Iron: 96 ug/dL (ref 42–145)
Saturation Ratios: 25.8 % (ref 20.0–50.0)
TIBC: 372.4 ug/dL (ref 250.0–450.0)
Transferrin: 266 mg/dL (ref 212.0–360.0)

## 2021-12-18 LAB — BASIC METABOLIC PANEL
BUN: 18 mg/dL (ref 6–23)
CO2: 31 mEq/L (ref 19–32)
Calcium: 10.1 mg/dL (ref 8.4–10.5)
Chloride: 99 mEq/L (ref 96–112)
Creatinine, Ser: 0.8 mg/dL (ref 0.40–1.20)
GFR: 77.29 mL/min (ref >60.00)
Glucose, Bld: 102 mg/dL — ABNORMAL HIGH (ref 70–99)
Potassium: 3.9 mEq/L (ref 3.5–5.1)
Sodium: 137 mEq/L (ref 135–145)

## 2021-12-18 LAB — HEPATIC FUNCTION PANEL
ALT: 25 U/L (ref 0–35)
AST: 21 U/L (ref 0–37)
Albumin: 4.5 g/dL (ref 3.5–5.2)
Alkaline Phosphatase: 55 U/L (ref 39–117)
Bilirubin, Direct: 0.1 mg/dL (ref 0.0–0.3)
Total Bilirubin: 0.6 mg/dL (ref 0.2–1.2)
Total Protein: 7.4 g/dL (ref 6.0–8.3)

## 2021-12-18 LAB — CBC WITH DIFFERENTIAL/PLATELET
Basophils Absolute: 0 10*3/uL (ref 0.0–0.1)
Basophils Relative: 0.5 % (ref 0.0–3.0)
Eosinophils Absolute: 0.2 10*3/uL (ref 0.0–0.7)
Eosinophils Relative: 2.9 % (ref 0.0–5.0)
HCT: 42.8 % (ref 36.0–46.0)
Hemoglobin: 14.2 g/dL (ref 12.0–15.0)
Lymphocytes Relative: 34.4 % (ref 12.0–46.0)
Lymphs Abs: 2.5 10*3/uL (ref 0.7–4.0)
MCHC: 33.2 g/dL (ref 30.0–36.0)
MCV: 91.2 fl (ref 78.0–100.0)
Monocytes Absolute: 0.7 10*3/uL (ref 0.1–1.0)
Monocytes Relative: 10.2 % (ref 3.0–12.0)
Neutro Abs: 3.8 10*3/uL (ref 1.4–7.7)
Neutrophils Relative %: 52 % (ref 43.0–77.0)
Platelets: 234 10*3/uL (ref 150.0–400.0)
RBC: 4.69 Mil/uL (ref 3.87–5.11)
RDW: 12.9 % (ref 11.5–15.5)
WBC: 7.3 10*3/uL (ref 4.0–10.5)

## 2021-12-18 MED ORDER — BUPROPION HCL ER (XL) 150 MG PO TB24: 150.0000 mg | ORAL_TABLET | Freq: Every day | ORAL | 3 refills | Status: DC

## 2021-12-18 MED ORDER — HYDROCHLOROTHIAZIDE 12.5 MG PO CAPS: ORAL_CAPSULE | ORAL | 3 refills | Status: DC

## 2021-12-18 NOTE — Progress Notes (Signed)
Patient ID: Lauren Chang, female   DOB: 1955-12-08, 66 y.o.   MRN: [id number]   Subjective:    Patient ID: Lauren Chang, female    DOB: 1955-04-23, 66 y.o.   MRN: [id number]    HPI With past history of Crohn's disease, GERD and hypertension.  She comes in today to follow up on these issues as well as for a complete physical exam.  Followed by GI for her crohns.  On humira.  Overall feels is relatively stable.  Tries to stay active.  No chest pain or sob reported.  No increased cough or congestion.  No acid reflux reported.  No abdominal pain. Saw dermatology s/p freezing- facial lesion (right cheek).  Has f/u in 02/2022.  Handling stress.  Overall doing better.     Past Medical History:  Diagnosis Date   Anxiety    Chlamydia    Crohn's disease ([place])    large intestine   GERD (gastroesophageal reflux disease)    Hypertension    Osteopenia    Past Surgical History:  Procedure Laterality Date   COLONOSCOPY WITH PROPOFOL N/A 01/27/2020   Procedure: COLONOSCOPY WITH PROPOFOL;  Surgeon: Lesly Rubenstein, MD;  Location: [facility] ENDOSCOPY;  Service: Endoscopy;  Laterality: N/A;   COLONOSCOPY WITH PROPOFOL N/A 07/09/2021   Procedure: COLONOSCOPY WITH PROPOFOL;  Surgeon: Lesly Rubenstein, MD;  Location: [facility] ENDOSCOPY;  Service: Endoscopy;  Laterality: N/A;   laparoscopy and tuba repair     orif right calcaneus     TONSILECTOMY/ADENOIDECTOMY WITH MYRINGOTOMY     Family History  Problem Relation Age of Onset   Lymphoma Father    Asthma Mother    Osteoporosis Mother    Mental illness Brother        suicide   Pancreatic cancer Other        uncle   COPD Maternal Grandmother    Breast cancer Neg Hx    Colon cancer Neg Hx    Social History   Socioeconomic History   Marital status: Widowed    Spouse name: Not on file   Number of children: 0   Years of education: Not on file   Highest education level: Not on file  Occupational History   Not on file  Tobacco Use   Smoking  status: Former    Types: Cigarettes    Quit date: 02/09/2002    Years since quitting: 19.8   Smokeless tobacco: Never  Vaping Use   Vaping Use: Never used  Substance and Sexual Activity   Alcohol use: No    Alcohol/week: 0.0 standard drinks of alcohol   Drug use: No   Sexual activity: Not on file  Other Topics Concern   Not on file  Social History Narrative   Not on file   Social Determinants of Health   Financial Resource Strain: Not on file  Food Insecurity: Not on file  Transportation Needs: Not on file  Physical Activity: Not on file  Stress: Not on file  Social Connections: Not on file     Review of Systems  Constitutional:  Negative for appetite change and unexpected weight change.  HENT:  Negative for congestion, sinus pressure and sore throat.   Eyes:  Negative for pain and visual disturbance.  Respiratory:  Negative for cough, chest tightness and shortness of breath.   Cardiovascular:  Negative for chest pain, palpitations and leg swelling.  Gastrointestinal:  Negative for abdominal pain, diarrhea, nausea and vomiting.  Genitourinary:  Negative for  difficulty urinating and dysuria.  Musculoskeletal:  Negative for joint swelling and myalgias.  Skin:  Negative for color change and rash.  Neurological:  Negative for dizziness, light-headedness and headaches.  Hematological:  Negative for adenopathy. Does not bruise/bleed easily.  Psychiatric/Behavioral:  Negative for agitation and dysphoric mood.        Objective:     BP 128/68 (BP Location: Left Arm, Patient Position: Sitting, Cuff Size: Normal)   Pulse 82   Temp (!) 97.4 F (36.3 C) (Oral)   Ht 5' 5"  (1.651 m)   Wt 154 lb 6.4 oz (70 kg)   LMP 04/12/2010   SpO2 97%   BMI 25.69 kg/m  Wt Readings from Last 3 Encounters:  12/18/21 154 lb 6.4 oz (70 kg)  09/16/21 158 lb (71.7 kg)  07/09/21 150 lb (68 kg)    Physical Exam Vitals reviewed.  Constitutional:      General: She is not in acute distress.     Appearance: Normal appearance. She is well-developed.  HENT:     Head: Normocephalic and atraumatic.     Right Ear: External ear normal.     Left Ear: External ear normal.  Eyes:     General: No scleral icterus.       Right eye: No discharge.        Left eye: No discharge.     Conjunctiva/sclera: Conjunctivae normal.  Neck:     Thyroid: No thyromegaly.  Cardiovascular:     Rate and Rhythm: Normal rate and regular rhythm.  Pulmonary:     Effort: No tachypnea, accessory muscle usage or respiratory distress.     Breath sounds: Normal breath sounds. No decreased breath sounds or wheezing.  Chest:  Breasts:    Right: No inverted nipple, mass, nipple discharge or tenderness (no axillary adenopathy).     Left: No inverted nipple, mass, nipple discharge or tenderness (no axilarry adenopathy).  Abdominal:     General: Bowel sounds are normal.     Palpations: Abdomen is soft.     Tenderness: There is no abdominal tenderness.  Musculoskeletal:        General: No swelling or tenderness.     Cervical back: Neck supple.  Lymphadenopathy:     Cervical: No cervical adenopathy.  Skin:    Findings: No erythema or rash.  Neurological:     Mental Status: She is alert and oriented to person, place, and time.  Psychiatric:        Mood and Affect: Mood normal.        Behavior: Behavior normal.      Outpatient Encounter Medications as of 12/18/2021  Medication Sig   Adalimumab (HUMIRA) 40 MG/0.4ML PSKT Day 1 inject 2 pens SQ, Day 15 inject 1 pen SQ & Day 29 inject 1 pen SQ   B Complex Vitamins (B COMPLEX 50) TABS Take 1 capsule by mouth daily.   cholecalciferol (VITAMIN D) 400 UNITS TABS tablet Take 400 Units by mouth 3 (three) times a week.   fluticasone (FLONASE) 50 MCG/ACT nasal spray 2 sprays each nostril one time per day (in pm).   Multiple Vitamins-Minerals (ALGAE BASED CALCIUM) TABS Take 1 tablet by mouth daily.   pantoprazole (PROTONIX) 40 MG tablet Take 1 tablet (40 mg total) by  mouth daily.   valACYclovir (VALTREX) 1000 MG tablet Use as directed.   zoledronic acid (RECLAST) 5 MG/100ML SOLN injection    zolpidem (AMBIEN) 5 MG tablet TAKE 1/2 TABLET BY MOUTH AT BEDTIME AS NEEDED.   [  DISCONTINUED] buPROPion (WELLBUTRIN XL) 150 MG 24 hr tablet Take 1 tablet (150 mg total) by mouth daily.   [DISCONTINUED] hydrochlorothiazide (MICROZIDE) 12.5 MG capsule TAKE (1) CAPSULE BY MOUTH ONCE DAILY.   buPROPion (WELLBUTRIN XL) 150 MG 24 hr tablet Take 1 tablet (150 mg total) by mouth daily.   hydrochlorothiazide (MICROZIDE) 12.5 MG capsule TAKE (1) CAPSULE BY MOUTH ONCE DAILY.   No facility-administered encounter medications on file as of 12/18/2021.     Lab Results  Component Value Date   WBC 7.3 12/18/2021   HGB 14.2 12/18/2021   HCT 42.8 12/18/2021   PLT 234.0 12/18/2021   GLUCOSE 102 (H) 12/18/2021   CHOL 211 (H) 12/18/2021   TRIG 129.0 12/18/2021   HDL 61.40 12/18/2021   LDLCALC 124 (H) 12/18/2021   ALT 25 12/18/2021   AST 21 12/18/2021   NA 137 12/18/2021   K 3.9 12/18/2021   CL 99 12/18/2021   CREATININE 0.80 12/18/2021   BUN 18 12/18/2021   CO2 31 12/18/2021   TSH 1.42 09/16/2021       Assessment & Plan:   Problem List Items Addressed This Visit     Anxiety    Increased stress.  Has retired.  Appears to be doing better.  Continue wellbutrin.  Follow.        Relevant Medications   buPROPion (WELLBUTRIN XL) 150 MG 24 hr tablet   Breast cancer screening - Primary    Mammogram 03/01/21 - birads I. Scheduled for f/u mammogram 03/05/22 - 10:00.       Relevant Orders   MM 3D SCREEN BREAST BILATERAL   Chronic venous insufficiency    Compression hose.        Relevant Medications   hydrochlorothiazide (MICROZIDE) 12.5 MG capsule   Crohn's disease ([place])    Colonoscopy 07/2021- hyperplastic polyp x 3.  Recommended f/u colonoscopy in 3 years.      GERD (gastroesophageal reflux disease)    No upper symptoms reported.  On protonix.        Health  care maintenance    Physical today 12/18/21.  PAP 9/1/122 - negative (atrophy) with negative HPV.  Mammogram 03/01/21 - Birads I.  Colonoscopy 07/2021 (due f/u 07/2022)        Hypertension, essential    Continue hctz.  Blood pressure as outlined.  Have her follow pressures.  Follow metabolic panel.       Relevant Medications   hydrochlorothiazide (MICROZIDE) 12.5 MG capsule   Osteoporosis    Continue calcium, vitamin D and weight bearing exercise.  Reclast 03/14/21.          [name], MD

## 2021-12-18 NOTE — Patient Instructions (Signed)
Mammogram - 03/05/22  [place] - 10:00 am

## 2021-12-18 NOTE — Assessment & Plan Note (Addendum)
Physical today 12/18/21.  PAP 9/1/122 - negative (atrophy) with negative HPV.  Mammogram 03/01/21 - Birads I.  Colonoscopy 07/2021 (due f/u 07/2022)

## 2021-12-22 ENCOUNTER — Encounter: Payer: Self-pay | Admitting: Internal Medicine

## 2021-12-22 NOTE — Assessment & Plan Note (Signed)
Increased stress.  Has retired.  Appears to be doing better.  Continue wellbutrin.  Follow.

## 2021-12-22 NOTE — Assessment & Plan Note (Signed)
Continue hctz.  Blood pressure as outlined.  Have her follow pressures.  Follow metabolic panel.

## 2021-12-22 NOTE — Assessment & Plan Note (Signed)
Colonoscopy 07/2021- hyperplastic polyp x 3.  Recommended f/u colonoscopy in 3 years.

## 2021-12-22 NOTE — Assessment & Plan Note (Signed)
Mammogram 03/01/21 - birads I. Scheduled for f/u mammogram 03/05/22 - 10:00.

## 2021-12-22 NOTE — Assessment & Plan Note (Signed)
No upper symptoms reported.  On protonix.

## 2021-12-22 NOTE — Assessment & Plan Note (Signed)
Compression hose.   

## 2021-12-22 NOTE — Assessment & Plan Note (Signed)
Continue calcium, vitamin D and weight bearing exercise.  Reclast 03/14/21.

## 2022-01-06 ENCOUNTER — Encounter (INDEPENDENT_AMBULATORY_CARE_PROVIDER_SITE_OTHER): Payer: Self-pay

## 2022-01-17 ENCOUNTER — Encounter: Payer: Self-pay | Admitting: Internal Medicine

## 2022-01-17 ENCOUNTER — Telehealth (INDEPENDENT_AMBULATORY_CARE_PROVIDER_SITE_OTHER): Payer: Medicare Other | Admitting: Internal Medicine

## 2022-01-17 VITALS — BP 123/74 | HR 99 | Temp 100.2°F | Ht 65.0 in | Wt 152.0 lb

## 2022-01-17 DIAGNOSIS — U071 COVID-19: Secondary | ICD-10-CM | POA: Diagnosis not present

## 2022-01-17 MED ORDER — MOLNUPIRAVIR EUA 200MG CAPSULE
4.0000 | ORAL_CAPSULE | Freq: Two times a day (BID) | ORAL | 0 refills | Status: AC
Start: 2022-01-17 — End: 2022-01-22

## 2022-01-17 NOTE — Telephone Encounter (Signed)
Please call and confirm symptoms?  Is she interested in oral antiviral medication  if wants to be seen or interested in oral antiviral - can add on virtual visit today

## 2022-01-17 NOTE — Telephone Encounter (Signed)
My chart message sent to Ms Lauren Chang for update.

## 2022-01-17 NOTE — Progress Notes (Unsigned)
Patient ID: Lauren Chang, female   DOB: 08-18-1955, 66 y.o.   MRN: [id number]   Virtual Visit via video Note    All issues noted in this document were discussed and addressed.  No physical exam was performed (except for noted visual exam findings with Video Visits).   I connected with [name] today by a video enabled telemedicine application and verified that I am speaking with the correct person using two identifiers. Location patient: home Location provider: work  Persons participating in the virtual visit: patient, provider  the limitations, risks, security and privacy concerns of performing an evaluation and management service by video and the availability of in person appointments have been discussed.  It has also been discussed with the patient that there may be a patient responsible charge related to this service. The patient expressed understanding and agreed to proceed.   Reason for visit: work in appt  HPI: Work in - covid positive.  States symptoms started yesterday - fatigue.  Allergy symptoms - headache and runny nose.  Tested for covid - positive.  Temperature 100.  Some persistent headache.  Taking tylenol.  Runny nose.  Increased sinus pressure.  Clear mucus.  No sore throat.  No chest pain, chest tightness or sob.  Increased frequency = stool.  No increased cough.  Has been trying to stay hydrated.     ROS: See pertinent positives and negatives per HPI.  Past Medical History:  Diagnosis Date   Anxiety    Chlamydia    Crohn's disease ([place])    large intestine   GERD (gastroesophageal reflux disease)    Hypertension    Osteopenia     Past Surgical History:  Procedure Laterality Date   COLONOSCOPY WITH PROPOFOL N/A 01/27/2020   Procedure: COLONOSCOPY WITH PROPOFOL;  Surgeon: Lesly Rubenstein, MD;  Location: [facility];  Service: Endoscopy;  Laterality: N/A;   COLONOSCOPY WITH PROPOFOL N/A 07/09/2021   Procedure: COLONOSCOPY WITH PROPOFOL;  Surgeon:  Lesly Rubenstein, MD;  Location: [facility];  Service: Endoscopy;  Laterality: N/A;   laparoscopy and tuba repair     orif right calcaneus     TONSILECTOMY/ADENOIDECTOMY WITH MYRINGOTOMY      Family History  Problem Relation Age of Onset   Lymphoma Father    Asthma Mother    Osteoporosis Mother    Mental illness Brother        suicide   Pancreatic cancer Other        uncle   COPD Maternal Grandmother    Breast cancer Neg Hx    Colon cancer Neg Hx     SOCIAL HX: reviewed.    Current Outpatient Medications:    Adalimumab (HUMIRA) 40 MG/0.4ML PSKT, Day 1 inject 2 pens SQ, Day 15 inject 1 pen SQ & Day 29 inject 1 pen SQ, Disp: , Rfl:    B Complex Vitamins (B COMPLEX 50) TABS, Take 1 capsule by mouth daily., Disp: , Rfl:    buPROPion (WELLBUTRIN XL) 150 MG 24 hr tablet, Take 1 tablet (150 mg total) by mouth daily., Disp: 90 tablet, Rfl: 3   cholecalciferol (VITAMIN D) 400 UNITS TABS tablet, Take 400 Units by mouth 3 (three) times a week., Disp: , Rfl:    fluticasone (FLONASE) 50 MCG/ACT nasal spray, 2 sprays each nostril one time per day (in pm)., Disp: 16 g, Rfl: 2   hydrochlorothiazide (MICROZIDE) 12.5 MG capsule, TAKE (1) CAPSULE BY MOUTH ONCE DAILY., Disp: 90 capsule, Rfl: 3  molnupiravir EUA (LAGEVRIO) 200 mg CAPS capsule, Take 4 capsules (800 mg total) by mouth 2 (two) times daily for 5 days., Disp: 40 capsule, Rfl: 0   Multiple Vitamins-Minerals (ALGAE BASED CALCIUM) TABS, Take 1 tablet by mouth daily., Disp: , Rfl:    pantoprazole (PROTONIX) 40 MG tablet, Take 1 tablet (40 mg total) by mouth daily., Disp: 30 tablet, Rfl: 3   valACYclovir (VALTREX) 1000 MG tablet, Use as directed., Disp: 30 tablet, Rfl: 0   zoledronic acid (RECLAST) 5 MG/100ML SOLN injection, , Disp: , Rfl:    zolpidem (AMBIEN) 5 MG tablet, TAKE 1/2 TABLET BY MOUTH AT BEDTIME AS NEEDED., Disp: 15 tablet, Rfl: 1  EXAM:  GENERAL: alert, oriented, appears well and in no acute distress  HEENT:  atraumatic, conjunttiva clear, no obvious abnormalities on inspection of external nose and ears  NECK: normal movements of the head and neck  LUNGS: on inspection no signs of respiratory distress, breathing rate appears normal, no obvious gross SOB, gasping or wheezing  CV: no obvious cyanosis  PSYCH/NEURO: pleasant and cooperative, no obvious depression or anxiety, speech and thought processing grossly intact  ASSESSMENT AND PLAN:  Discussed the following assessment and plan:  Problem List Items Addressed This Visit     COVID-19 virus infection - Primary    Discussed covid.  Treat symptoms - saline nasal spray and steroid nasal spray as directed.  Robitussin DM if needed.  Discussed oral antiviral medication.  Molnulpiravir as directed.  Discussed possible side effects.  Discussed quarantine guidelines.  Rest.  Fluids.  Call with update.        Relevant Medications   molnupiravir EUA (LAGEVRIO) 200 mg CAPS capsule    Return if symptoms worsen or fail to improve.   I discussed the assessment and treatment plan with the patient. The patient was provided an opportunity to ask questions and all were answered. The patient agreed with the plan and demonstrated an understanding of the instructions.   The patient was advised to call back or seek an in-person evaluation if the symptoms worsen or if the condition fails to improve as anticipated.    Lauren Pheasant, MD

## 2022-01-18 ENCOUNTER — Encounter: Payer: Self-pay | Admitting: Internal Medicine

## 2022-01-18 DIAGNOSIS — U071 COVID-19: Secondary | ICD-10-CM | POA: Insufficient documentation

## 2022-01-18 NOTE — Assessment & Plan Note (Signed)
Discussed covid.  Treat symptoms - saline nasal spray and steroid nasal spray as directed.  Robitussin DM if needed.  Discussed oral antiviral medication.  Molnulpiravir as directed.  Discussed possible side effects.  Discussed quarantine guidelines.  Rest.  Fluids.  Call with update.

## 2022-03-17 ENCOUNTER — Ambulatory Visit
Admission: RE | Admit: 2022-03-17 | Discharge: 2022-03-17 | Disposition: A | Discharge status: Home / Self Care | Payer: Medicare Other | Source: Ambulatory Visit | Attending: Internal Medicine | Admitting: Internal Medicine

## 2022-03-17 DIAGNOSIS — Z1231 Encounter for screening mammogram for malignant neoplasm of breast: Secondary | ICD-10-CM | POA: Diagnosis present

## 2022-04-21 ENCOUNTER — Ambulatory Visit (INDEPENDENT_AMBULATORY_CARE_PROVIDER_SITE_OTHER): Payer: Medicare Other | Admitting: Internal Medicine

## 2022-04-21 ENCOUNTER — Encounter: Payer: Self-pay | Admitting: Internal Medicine

## 2022-04-21 VITALS — BP 128/74 | HR 74 | Temp 97.9°F | Resp 16 | Ht 65.0 in | Wt 152.8 lb

## 2022-04-21 DIAGNOSIS — I10 Essential (primary) hypertension: Secondary | ICD-10-CM | POA: Diagnosis not present

## 2022-04-21 DIAGNOSIS — Z1231 Encounter for screening mammogram for malignant neoplasm of breast: Secondary | ICD-10-CM

## 2022-04-21 DIAGNOSIS — F419 Anxiety disorder, unspecified: Secondary | ICD-10-CM

## 2022-04-21 DIAGNOSIS — K219 Gastro-esophageal reflux disease without esophagitis: Secondary | ICD-10-CM

## 2022-04-21 DIAGNOSIS — K50919 Crohn's disease, unspecified, with unspecified complications: Secondary | ICD-10-CM | POA: Diagnosis not present

## 2022-04-21 DIAGNOSIS — G479 Sleep disorder, unspecified: Secondary | ICD-10-CM

## 2022-04-21 DIAGNOSIS — M810 Age-related osteoporosis without current pathological fracture: Secondary | ICD-10-CM

## 2022-04-21 LAB — HEPATIC FUNCTION PANEL
ALT: 21 U/L (ref 0–35)
AST: 18 U/L (ref 0–37)
Albumin: 4.4 g/dL (ref 3.5–5.2)
Alkaline Phosphatase: 53 U/L (ref 39–117)
Bilirubin, Direct: 0.1 mg/dL (ref 0.0–0.3)
Total Bilirubin: 0.6 mg/dL (ref 0.2–1.2)
Total Protein: 6.9 g/dL (ref 6.0–8.3)

## 2022-04-21 LAB — LIPID PANEL
Cholesterol: 203 mg/dL — ABNORMAL HIGH (ref 0–200)
HDL: 54.9 mg/dL (ref >39.00)
LDL Cholesterol: 122 mg/dL — ABNORMAL HIGH (ref 0–99)
NonHDL: 148.4
Total CHOL/HDL Ratio: 4
Triglycerides: 133 mg/dL (ref 0.0–149.0)
VLDL: 26.6 mg/dL (ref 0.0–40.0)

## 2022-04-21 LAB — CBC WITH DIFFERENTIAL/PLATELET
Basophils Absolute: 0 10*3/uL (ref 0.0–0.1)
Basophils Relative: 0.6 % (ref 0.0–3.0)
Eosinophils Absolute: 0.2 10*3/uL (ref 0.0–0.7)
Eosinophils Relative: 3.3 % (ref 0.0–5.0)
HCT: 42.3 % (ref 36.0–46.0)
Hemoglobin: 14.1 g/dL (ref 12.0–15.0)
Lymphocytes Relative: 38.9 % (ref 12.0–46.0)
Lymphs Abs: 2.7 10*3/uL (ref 0.7–4.0)
MCHC: 33.3 g/dL (ref 30.0–36.0)
MCV: 91.1 fl (ref 78.0–100.0)
Monocytes Absolute: 0.6 10*3/uL (ref 0.1–1.0)
Monocytes Relative: 9.5 % (ref 3.0–12.0)
Neutro Abs: 3.3 10*3/uL (ref 1.4–7.7)
Neutrophils Relative %: 47.7 % (ref 43.0–77.0)
Platelets: 260 10*3/uL (ref 150.0–400.0)
RBC: 4.65 Mil/uL (ref 3.87–5.11)
RDW: 12.5 % (ref 11.5–15.5)
WBC: 6.8 10*3/uL (ref 4.0–10.5)

## 2022-04-21 LAB — BASIC METABOLIC PANEL
BUN: 21 mg/dL (ref 6–23)
CO2: 32 mEq/L (ref 19–32)
Calcium: 9.9 mg/dL (ref 8.4–10.5)
Chloride: 99 mEq/L (ref 96–112)
Creatinine, Ser: 0.85 mg/dL (ref 0.40–1.20)
GFR: 71.69 mL/min (ref >60.00)
Glucose, Bld: 102 mg/dL — ABNORMAL HIGH (ref 70–99)
Potassium: 3.7 mEq/L (ref 3.5–5.1)
Sodium: 137 mEq/L (ref 135–145)

## 2022-04-21 MED ORDER — ZOLPIDEM TARTRATE 5 MG PO TABS: 2.5000 mg | ORAL_TABLET | Freq: Every evening | ORAL | 1 refills | Status: DC | PRN

## 2022-04-21 MED ORDER — VALACYCLOVIR HCL 1 G PO TABS: ORAL_TABLET | ORAL | 0 refills | Status: DC

## 2022-04-21 NOTE — Progress Notes (Signed)
Subjective:    Patient ID: Lauren Chang, female    DOB: November 09, 1955, 67 y.o.   MRN: [id number]  Patient here for  Chief Complaint  Patient presents with   Medical Management of Chronic Issues    HPI Here to follow up regarding Crohn's, GERD and hypertension.  Followed by GI - Crohns.  On humira.   Continues wellbutrin.  Has retired. 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  Overall appears to be handling things relatively well.  No chest pain or sob reported.  Bowels relatively stable.  On humira.   On protonix.  Reclast 03/26/22.  Planning for f/u bone density 02/2022.  Ambien/melatonin - sleep.    Past Medical History:  Diagnosis Date   Anxiety    Chlamydia    Crohn's disease ([place])    large intestine   GERD (gastroesophageal reflux disease)    Hypertension    Osteopenia    Past Surgical History:  Procedure Laterality Date   COLONOSCOPY WITH PROPOFOL N/A 01/27/2020   Procedure: COLONOSCOPY WITH PROPOFOL;  Surgeon: Lesly Rubenstein, MD;  Location: [facility];  Service: Endoscopy;  Laterality: N/A;   COLONOSCOPY WITH PROPOFOL N/A 07/09/2021   Procedure: COLONOSCOPY WITH PROPOFOL;  Surgeon: Lesly Rubenstein, MD;  Location: [facility];  Service: Endoscopy;  Laterality: N/A;   laparoscopy and tuba repair     orif right calcaneus     TONSILECTOMY/ADENOIDECTOMY WITH MYRINGOTOMY     Family History  Problem Relation Age of Onset   Lymphoma Father    Asthma Mother    Osteoporosis Mother    Mental illness Brother        suicide   Pancreatic cancer Other        uncle   COPD Maternal Grandmother    Breast cancer Neg Hx    Colon cancer Neg Hx    Social History   Socioeconomic History   Marital status: Widowed    Spouse name: Not on file   Number of children: 0   Years of education: Not on file   Highest education level: Not on file  Occupational History   Not on file  Tobacco Use   Smoking status: Former    Types:  Cigarettes    Quit date: 02/09/2002    Years since quitting: 20.2   Smokeless tobacco: Never  Vaping Use   Vaping Use: Never used  Substance and Sexual Activity   Alcohol use: No    Alcohol/week: 0.0 standard drinks of alcohol   Drug use: No   Sexual activity: Not on file  Other Topics Concern   Not on file  Social History Narrative   Not on file   Social Determinants of Health   Financial Resource Strain: Not on file  Food Insecurity: Not on file  Transportation Needs: Not on file  Physical Activity: Not on file  Stress: Not on file  Social Connections: Not on file     Review of Systems  Constitutional:  Negative for appetite change and unexpected weight change.  HENT:  Negative for congestion.   Respiratory:  Negative for cough, chest tightness and shortness of breath.   Cardiovascular:  Negative for chest pain, palpitations and leg swelling.  Gastrointestinal:  Negative for abdominal pain, diarrhea, nausea and vomiting.  Genitourinary:  Negative for difficulty urinating and dysuria.  Musculoskeletal:  Negative for joint swelling and myalgias.  Skin:  Negative for color change and rash.  Neurological:  Negative  for dizziness and headaches.  Psychiatric/Behavioral:  Negative for agitation and dysphoric mood.        Objective:     BP 128/74   Pulse 74   Temp 97.9 F (36.6 C)   Resp 16   Ht 5' 5"$  (1.651 m)   Wt 152 lb 12.8 oz (69.3 kg)   LMP 04/12/2010   SpO2 97%   BMI 25.43 kg/m  Wt Readings from Last 3 Encounters:  04/21/22 152 lb 12.8 oz (69.3 kg)  01/17/22 152 lb (68.9 kg)  12/18/21 154 lb 6.4 oz (70 kg)    Physical Exam Vitals reviewed.  Constitutional:      General: She is not in acute distress.    Appearance: Normal appearance.  HENT:     Head: Normocephalic and atraumatic.     Right Ear: External ear normal.     Left Ear: External ear normal.  Eyes:     General: No scleral icterus.       Right eye: No discharge.        Left eye: No  discharge.     Conjunctiva/sclera: Conjunctivae normal.  Neck:     Thyroid: No thyromegaly.  Cardiovascular:     Rate and Rhythm: Normal rate and regular rhythm.  Pulmonary:     Effort: No respiratory distress.     Breath sounds: Normal breath sounds. No wheezing.  Abdominal:     General: Bowel sounds are normal.     Palpations: Abdomen is soft.     Tenderness: There is no abdominal tenderness.  Musculoskeletal:        General: No swelling or tenderness.     Cervical back: Neck supple. No tenderness.  Lymphadenopathy:     Cervical: No cervical adenopathy.  Skin:    Findings: No erythema or rash.  Neurological:     Mental Status: She is alert.  Psychiatric:        Mood and Affect: Mood normal.        Behavior: Behavior normal.      Outpatient Encounter Medications as of 04/21/2022  Medication Sig   Adalimumab (HUMIRA) 40 MG/0.4ML PSKT Day 1 inject 2 pens SQ, Day 15 inject 1 pen SQ & Day 29 inject 1 pen SQ   B Complex Vitamins (B COMPLEX 50) TABS Take 1 capsule by mouth daily.   buPROPion (WELLBUTRIN XL) 150 MG 24 hr tablet Take 1 tablet (150 mg total) by mouth daily.   cholecalciferol (VITAMIN D) 400 UNITS TABS tablet Take 400 Units by mouth 3 (three) times a week.   fluticasone (FLONASE) 50 MCG/ACT nasal spray 2 sprays each nostril one time per day (in pm).   hydrochlorothiazide (MICROZIDE) 12.5 MG capsule TAKE (1) CAPSULE BY MOUTH ONCE DAILY.   Multiple Vitamins-Minerals (ALGAE BASED CALCIUM) TABS Take 1 tablet by mouth daily.   pantoprazole (PROTONIX) 40 MG tablet Take 1 tablet (40 mg total) by mouth daily.   valACYclovir (VALTREX) 1000 MG tablet Use as directed.   zoledronic acid (RECLAST) 5 MG/100ML SOLN injection    zolpidem (AMBIEN) 5 MG tablet Take 0.5 tablets (2.5 mg total) by mouth at bedtime as needed.   [DISCONTINUED] valACYclovir (VALTREX) 1000 MG tablet Use as directed.   [DISCONTINUED] zolpidem (AMBIEN) 5 MG tablet TAKE 1/2 TABLET BY MOUTH AT BEDTIME AS NEEDED.    No facility-administered encounter medications on file as of 04/21/2022.     Lab Results  Component Value Date   WBC 6.8 04/21/2022   HGB 14.1 04/21/2022  HCT 42.3 04/21/2022   PLT 260.0 04/21/2022   GLUCOSE 102 (H) 04/21/2022   CHOL 203 (H) 04/21/2022   TRIG 133.0 04/21/2022   HDL 54.90 04/21/2022   LDLCALC 122 (H) 04/21/2022   ALT 21 04/21/2022   AST 18 04/21/2022   NA 137 04/21/2022   K 3.7 04/21/2022   CL 99 04/21/2022   CREATININE 0.85 04/21/2022   BUN 21 04/21/2022   CO2 32 04/21/2022   TSH 1.42 09/16/2021    MM 3D SCREEN BREAST BILATERAL  Result Date: 03/17/2022 CLINICAL DATA:  Screening. EXAM: DIGITAL SCREENING BILATERAL MAMMOGRAM WITH TOMOSYNTHESIS AND CAD TECHNIQUE: Bilateral screening digital craniocaudal and mediolateral oblique mammograms were obtained. Bilateral screening digital breast tomosynthesis was performed. The images were evaluated with computer-aided detection. COMPARISON:  Previous exam(s). ACR Breast Density Category c: The breast tissue is heterogeneously dense, which may obscure small masses. FINDINGS: There are no findings suspicious for malignancy. IMPRESSION: No mammographic evidence of malignancy. A result letter of this screening mammogram will be mailed directly to the patient. RECOMMENDATION: Screening mammogram in one year. (Code:SM-B-01Y) BI-RADS CATEGORY  1: Negative. Electronically Signed   By: Fidela Salisbury M.D.   On: 03/17/2022 15:37       Assessment & Plan:  Crohn's disease with complication, unspecified gastrointestinal tract location [facility]) Assessment & Plan: Colonoscopy 07/2021- hyperplastic polyp x 3.  Recommended f/u colonoscopy in 3 years. Receiving humira.  Stable.   Orders: -     CBC with Differential/Platelet -     Basic metabolic panel -     Hepatic function panel  Hypertension, essential Assessment & Plan: Continue hctz.  Blood pressure as outlined.  Have her follow pressures.  Follow metabolic panel.    Orders: -     Lipid panel  Anxiety Assessment & Plan: Increased stress.  Has retired.  Appears to be handling things well. Continue wellbutrin.  Follow.     Encounter for screening mammogram for malignant neoplasm of breast Assessment & Plan: Mammogram 03/17/22 - birads I.    Gastroesophageal reflux disease, unspecified whether esophagitis present Assessment & Plan: No upper symptoms reported.  On protonix.     Osteoporosis without current pathological fracture, unspecified osteoporosis type Assessment & Plan: Continue calcium, vitamin D and weight bearing exercise.  Reclast 03/26/22.     Sleep disorder Assessment & Plan: Ambien - does not take often.  Follow.     Other orders -     Zolpidem Tartrate; Take 0.5 tablets (2.5 mg total) by mouth at bedtime as needed.  Dispense: 15 tablet; Refill: 1 -     valACYclovir HCl; Use as directed.  Dispense: 30 tablet; Refill: 0     [name], MD

## 2022-04-22 ENCOUNTER — Encounter: Payer: Self-pay | Admitting: Internal Medicine

## 2022-04-27 ENCOUNTER — Encounter: Payer: Self-pay | Admitting: Internal Medicine

## 2022-04-27 NOTE — Assessment & Plan Note (Signed)
No upper symptoms reported.  On protonix.   

## 2022-04-27 NOTE — Assessment & Plan Note (Signed)
Continue calcium, vitamin D and weight bearing exercise.  Reclast 03/26/22.

## 2022-04-27 NOTE — Assessment & Plan Note (Signed)
Ambien - does not take often.  Follow.

## 2022-04-27 NOTE — Assessment & Plan Note (Signed)
Colonoscopy 07/2021- hyperplastic polyp x 3.  Recommended f/u colonoscopy in 3 years. Receiving humira.  Stable.

## 2022-04-27 NOTE — Assessment & Plan Note (Signed)
Increased stress.  Has retired.  Appears to be handling things well. Continue wellbutrin.  Follow.

## 2022-04-27 NOTE — Assessment & Plan Note (Addendum)
Continue hctz.  Blood pressure as outlined.  Have her follow pressures.  Follow metabolic panel.  

## 2022-04-27 NOTE — Assessment & Plan Note (Signed)
Mammogram 03/17/22 - birads I.

## 2022-07-08 ENCOUNTER — Ambulatory Visit: Payer: 59 | Admitting: Internal Medicine

## 2022-08-21 ENCOUNTER — Ambulatory Visit (INDEPENDENT_AMBULATORY_CARE_PROVIDER_SITE_OTHER): Payer: Medicare Other | Admitting: Internal Medicine

## 2022-08-21 VITALS — BP 124/72 | HR 74 | Temp 98.2°F | Resp 16 | Ht 65.0 in | Wt 146.2 lb

## 2022-08-21 DIAGNOSIS — I10 Essential (primary) hypertension: Secondary | ICD-10-CM | POA: Diagnosis not present

## 2022-08-21 DIAGNOSIS — K50919 Crohn's disease, unspecified, with unspecified complications: Secondary | ICD-10-CM | POA: Diagnosis not present

## 2022-08-21 DIAGNOSIS — F419 Anxiety disorder, unspecified: Secondary | ICD-10-CM

## 2022-08-21 DIAGNOSIS — K219 Gastro-esophageal reflux disease without esophagitis: Secondary | ICD-10-CM | POA: Diagnosis not present

## 2022-08-21 DIAGNOSIS — M810 Age-related osteoporosis without current pathological fracture: Secondary | ICD-10-CM

## 2022-08-21 LAB — LIPID PANEL
Cholesterol: 184 mg/dL (ref 0–200)
HDL: 58.1 mg/dL (ref >39.00)
LDL Cholesterol: 107 mg/dL — ABNORMAL HIGH (ref 0–99)
NonHDL: 125.98
Total CHOL/HDL Ratio: 3
Triglycerides: 94 mg/dL (ref 0.0–149.0)
VLDL: 18.8 mg/dL (ref 0.0–40.0)

## 2022-08-21 LAB — BASIC METABOLIC PANEL
BUN: 11 mg/dL (ref 6–23)
CO2: 31 mEq/L (ref 19–32)
Calcium: 9.9 mg/dL (ref 8.4–10.5)
Chloride: 100 mEq/L (ref 96–112)
Creatinine, Ser: 0.77 mg/dL (ref 0.40–1.20)
GFR: 80.53 mL/min (ref >60.00)
Glucose, Bld: 102 mg/dL — ABNORMAL HIGH (ref 70–99)
Potassium: 4.2 mEq/L (ref 3.5–5.1)
Sodium: 138 mEq/L (ref 135–145)

## 2022-08-21 LAB — CBC WITH DIFFERENTIAL/PLATELET
Basophils Absolute: 0 10*3/uL (ref 0.0–0.1)
Basophils Relative: 0.7 % (ref 0.0–3.0)
Eosinophils Absolute: 0.2 10*3/uL (ref 0.0–0.7)
Eosinophils Relative: 2.8 % (ref 0.0–5.0)
HCT: 43 % (ref 36.0–46.0)
Hemoglobin: 14.2 g/dL (ref 12.0–15.0)
Lymphocytes Relative: 41.4 % (ref 12.0–46.0)
Lymphs Abs: 2.6 10*3/uL (ref 0.7–4.0)
MCHC: 33 g/dL (ref 30.0–36.0)
MCV: 91.6 fl (ref 78.0–100.0)
Monocytes Absolute: 0.7 10*3/uL (ref 0.1–1.0)
Monocytes Relative: 10.9 % (ref 3.0–12.0)
Neutro Abs: 2.8 10*3/uL (ref 1.4–7.7)
Neutrophils Relative %: 44.2 % (ref 43.0–77.0)
Platelets: 219 10*3/uL (ref 150.0–400.0)
RBC: 4.69 Mil/uL (ref 3.87–5.11)
RDW: 12.6 % (ref 11.5–15.5)
WBC: 6.3 10*3/uL (ref 4.0–10.5)

## 2022-08-21 LAB — HEPATIC FUNCTION PANEL
ALT: 24 U/L (ref 0–35)
AST: 29 U/L (ref 0–37)
Albumin: 4.4 g/dL (ref 3.5–5.2)
Alkaline Phosphatase: 44 U/L (ref 39–117)
Bilirubin, Direct: 0.1 mg/dL (ref 0.0–0.3)
Total Bilirubin: 0.6 mg/dL (ref 0.2–1.2)
Total Protein: 7.5 g/dL (ref 6.0–8.3)

## 2022-08-21 LAB — TSH: TSH: 0.82 u[IU]/mL (ref 0.35–5.50)

## 2022-08-21 NOTE — Progress Notes (Signed)
Subjective:    Patient ID: Lauren Chang, female    DOB: 25-Mar-1955, 67 y.o.   MRN: [id number]  Patient here for  Chief Complaint  Patient presents with   Medical Management of Chronic Issues    HPI Here to follow up regarding Crohn's, GERD and hypertension.  Followed by GI - Crohns.  On humira.  Bowels doing well. Continues wellbutrin.  Has retired. Handling stress. Reclast 03/26/22.  Planning for f/u bone density 02/2023.  Has adjusted her diet.  Doing well with this.  Has lost weight.  Eating better.  No chest pain or sob reported. Seeing a therapist - weekly.  This is going well.     Past Medical History:  Diagnosis Date   Anxiety    Chlamydia    Crohn's disease (HCC)    large intestine   GERD (gastroesophageal reflux disease)    Hypertension    Osteopenia    Past Surgical History:  Procedure Laterality Date   COLONOSCOPY WITH PROPOFOL N/A 01/27/2020   Procedure: COLONOSCOPY WITH PROPOFOL;  Surgeon: Regis Bill, MD;  Location: [facility] ENDOSCOPY;  Service: Endoscopy;  Laterality: N/A;   COLONOSCOPY WITH PROPOFOL N/A 07/09/2021   Procedure: COLONOSCOPY WITH PROPOFOL;  Surgeon: Regis Bill, MD;  Location: [facility] ENDOSCOPY;  Service: Endoscopy;  Laterality: N/A;   laparoscopy and tuba repair     orif right calcaneus     TONSILECTOMY/ADENOIDECTOMY WITH MYRINGOTOMY     Family History  Problem Relation Age of Onset   Lymphoma Father    Asthma Mother    Osteoporosis Mother    Mental illness Brother        suicide   Pancreatic cancer Other        uncle   COPD Maternal Grandmother    Breast cancer Neg Hx    Colon cancer Neg Hx    Social History   Socioeconomic History   Marital status: Widowed    Spouse name: Not on file   Number of children: 0   Years of education: Not on file   Highest education level: Master's degree (e.g., MA, MS, MEng, MEd, MSW, MBA)  Occupational History   Not on file  Tobacco Use   Smoking status: Former    Types: Cigarettes     Quit date: 02/09/2002    Years since quitting: 20.5   Smokeless tobacco: Never  Vaping Use   Vaping Use: Never used  Substance and Sexual Activity   Alcohol use: No    Alcohol/week: 0.0 standard drinks of alcohol   Drug use: No   Sexual activity: Not on file  Other Topics Concern   Not on file  Social History Narrative   Not on file   Social Determinants of Health   Financial Resource Strain: Low Risk  (08/17/2022)   Overall Financial Resource Strain (CARDIA)    Difficulty of Paying Living Expenses: Not hard at all  Food Insecurity: No Food Insecurity (08/17/2022)   Hunger Vital Sign    Worried About Running Out of Food in the Last Year: Never true    Ran Out of Food in the Last Year: Never true  Transportation Needs: No Transportation Needs (08/17/2022)   PRAPARE - Administrator, Civil Service (Medical): No    Lack of Transportation (Non-Medical): No  Physical Activity: Sufficiently Active (08/17/2022)   Exercise Vital Sign    Days of Exercise per Week: 5 days    Minutes of Exercise per Session: 90 min  Stress: No Stress Concern Present (08/17/2022)   [employer] of Occupational Health - Occupational Stress Questionnaire    Feeling of Stress : Only a little  Social Connections: Moderately Isolated (08/17/2022)   Social Connection and Isolation Panel [NHANES]    Frequency of Communication with Friends and Family: Twice a week    Frequency of Social Gatherings with Friends and Family: Twice a week    Attends Religious Services: 1 to 4 times per year    Active Member of [employer] or Organizations: No    Attends Banker Meetings: Not on file    Marital Status: Widowed     Review of Systems  Constitutional:  Negative for appetite change and unexpected weight change.  HENT:  Negative for congestion and sinus pressure.   Respiratory:  Negative for cough, chest tightness and shortness of breath.   Cardiovascular:  Negative for chest pain, palpitations and  leg swelling.  Gastrointestinal:  Negative for abdominal pain, diarrhea, nausea and vomiting.  Genitourinary:  Negative for difficulty urinating and dysuria.  Musculoskeletal:  Negative for joint swelling and myalgias.  Skin:  Negative for color change and rash.  Neurological:  Negative for dizziness and headaches.  Psychiatric/Behavioral:  Negative for agitation and dysphoric mood.        Objective:     BP 124/72   Pulse 74   Temp 98.2 F (36.8 C)   Resp 16   Ht 5\' 5"  (1.651 m)   Wt 146 lb 3.2 oz (66.3 kg)   LMP 04/12/2010   SpO2 98%   BMI 24.33 kg/m  Wt Readings from Last 3 Encounters:  08/21/22 146 lb 3.2 oz (66.3 kg)  04/21/22 152 lb 12.8 oz (69.3 kg)  01/17/22 152 lb (68.9 kg)    Physical Exam Vitals reviewed.  Constitutional:      General: She is not in acute distress.    Appearance: Normal appearance.  HENT:     Head: Normocephalic and atraumatic.     Right Ear: External ear normal.     Left Ear: External ear normal.  Eyes:     General: No scleral icterus.       Right eye: No discharge.        Left eye: No discharge.     Conjunctiva/sclera: Conjunctivae normal.  Neck:     Thyroid: No thyromegaly.  Cardiovascular:     Rate and Rhythm: Normal rate and regular rhythm.  Pulmonary:     Effort: No respiratory distress.     Breath sounds: Normal breath sounds. No wheezing.  Abdominal:     General: Bowel sounds are normal.     Palpations: Abdomen is soft.     Tenderness: There is no abdominal tenderness.  Musculoskeletal:        General: No swelling or tenderness.     Cervical back: Neck supple. No tenderness.  Lymphadenopathy:     Cervical: No cervical adenopathy.  Skin:    Findings: No erythema or rash.  Neurological:     Mental Status: She is alert.  Psychiatric:        Mood and Affect: Mood normal.        Behavior: Behavior normal.      Outpatient Encounter Medications as of 08/21/2022  Medication Sig   Adalimumab (HUMIRA) 40 MG/0.4ML PSKT  Day 1 inject 2 pens SQ, Day 15 inject 1 pen SQ & Day 29 inject 1 pen SQ   B Complex Vitamins (B COMPLEX 50) TABS Take 1 capsule  by mouth daily.   buPROPion (WELLBUTRIN XL) 150 MG 24 hr tablet Take 1 tablet (150 mg total) by mouth daily.   cholecalciferol (VITAMIN D) 400 UNITS TABS tablet Take 400 Units by mouth 3 (three) times a week.   fluticasone (FLONASE) 50 MCG/ACT nasal spray 2 sprays each nostril one time per day (in pm).   hydrochlorothiazide (MICROZIDE) 12.5 MG capsule TAKE (1) CAPSULE BY MOUTH ONCE DAILY.   Multiple Vitamins-Minerals (ALGAE BASED CALCIUM) TABS Take 1 tablet by mouth daily.   pantoprazole (PROTONIX) 40 MG tablet Take 1 tablet (40 mg total) by mouth daily.   valACYclovir (VALTREX) 1000 MG tablet Use as directed.   zoledronic acid (RECLAST) 5 MG/100ML SOLN injection    zolpidem (AMBIEN) 5 MG tablet Take 0.5 tablets (2.5 mg total) by mouth at bedtime as needed.   No facility-administered encounter medications on file as of 08/21/2022.     Lab Results  Component Value Date   WBC 6.3 08/21/2022   HGB 14.2 08/21/2022   HCT 43.0 08/21/2022   PLT 219.0 08/21/2022   GLUCOSE 102 (H) 08/21/2022   CHOL 184 08/21/2022   TRIG 94.0 08/21/2022   HDL 58.10 08/21/2022   LDLCALC 107 (H) 08/21/2022   ALT 24 08/21/2022   AST 29 08/21/2022   NA 138 08/21/2022   K 4.2 08/21/2022   CL 100 08/21/2022   CREATININE 0.77 08/21/2022   BUN 11 08/21/2022   CO2 31 08/21/2022   TSH 0.82 08/21/2022    MM 3D SCREEN BREAST BILATERAL  Result Date: 03/17/2022 CLINICAL DATA:  Screening. EXAM: DIGITAL SCREENING BILATERAL MAMMOGRAM WITH TOMOSYNTHESIS AND CAD TECHNIQUE: Bilateral screening digital craniocaudal and mediolateral oblique mammograms were obtained. Bilateral screening digital breast tomosynthesis was performed. The images were evaluated with computer-aided detection. COMPARISON:  Previous exam(s). ACR Breast Density Category c: The breast tissue is heterogeneously dense, which may  obscure small masses. FINDINGS: There are no findings suspicious for malignancy. IMPRESSION: No mammographic evidence of malignancy. A result letter of this screening mammogram will be mailed directly to the patient. RECOMMENDATION: Screening mammogram in one year. (Code:SM-B-01Y) BI-RADS CATEGORY  1: Negative. Electronically Signed   By: Ted Mcalpine M.D.   On: 03/17/2022 15:37       Assessment & Plan:  Hypertension, essential Assessment & Plan: Continue hctz.  Blood pressure as outlined.  Have her follow pressures.  Follow metabolic panel.   Orders: -     Basic metabolic panel -     TSH -     Lipid panel  Crohn's disease with complication, unspecified gastrointestinal tract location [facility]) Assessment & Plan: Colonoscopy 07/2021- hyperplastic polyp x 3.  Recommended f/u colonoscopy in 3 years. Receiving humira.  Stable.   Orders: -     CBC with Differential/Platelet -     Basic metabolic panel -     Hepatic function panel  Anxiety Assessment & Plan: Has previously been dealing with increased stress.  Has retired.  Appears to be handling things well. Continue wellbutrin.  Follow.     Gastroesophageal reflux disease, unspecified whether esophagitis present Assessment & Plan: No upper symptoms reported.  On protonix.     Osteoporosis without current pathological fracture, unspecified osteoporosis type Assessment & Plan: Continue calcium, vitamin D and weight bearing exercise.  Reclast 03/26/22.        Dale [place], MD

## 2022-08-27 ENCOUNTER — Encounter: Payer: Self-pay | Admitting: Internal Medicine

## 2022-08-27 NOTE — Assessment & Plan Note (Signed)
Has previously been dealing with increased stress.  Has retired.  Appears to be handling things well. Continue wellbutrin.  Follow.

## 2022-08-27 NOTE — Assessment & Plan Note (Signed)
Continue calcium, vitamin D and weight bearing exercise.  Reclast 03/26/22.   

## 2022-08-27 NOTE — Assessment & Plan Note (Signed)
No upper symptoms reported.  On protonix.   

## 2022-08-27 NOTE — Assessment & Plan Note (Signed)
Continue hctz.  Blood pressure as outlined.  Have her follow pressures.  Follow metabolic panel.  

## 2022-08-27 NOTE — Assessment & Plan Note (Signed)
Colonoscopy 07/2021- hyperplastic polyp x 3.  Recommended f/u colonoscopy in 3 years. Receiving humira.  Stable.  

## 2022-12-22 ENCOUNTER — Ambulatory Visit (INDEPENDENT_AMBULATORY_CARE_PROVIDER_SITE_OTHER): Payer: Medicare Other | Admitting: Internal Medicine

## 2022-12-22 VITALS — BP 122/70 | HR 82 | Temp 97.9°F | Resp 16 | Ht 64.0 in | Wt 140.2 lb

## 2022-12-22 DIAGNOSIS — K219 Gastro-esophageal reflux disease without esophagitis: Secondary | ICD-10-CM | POA: Diagnosis not present

## 2022-12-22 DIAGNOSIS — Z0000 Encounter for general adult medical examination without abnormal findings: Secondary | ICD-10-CM

## 2022-12-22 DIAGNOSIS — I10 Essential (primary) hypertension: Secondary | ICD-10-CM | POA: Diagnosis not present

## 2022-12-22 DIAGNOSIS — K50919 Crohn's disease, unspecified, with unspecified complications: Secondary | ICD-10-CM

## 2022-12-22 DIAGNOSIS — F419 Anxiety disorder, unspecified: Secondary | ICD-10-CM

## 2022-12-22 DIAGNOSIS — M810 Age-related osteoporosis without current pathological fracture: Secondary | ICD-10-CM

## 2022-12-22 NOTE — Progress Notes (Addendum)
Subjective:    Patient ID: Lauren Chang, female    DOB: 03-27-1955, 67 y.o.   MRN: [id number]  Patient here for  Chief Complaint  Patient presents with   Annual Exam    HPI Here for a physical exam. Has crohn's.  Followed by GI.  On humira. Bowels are doing well.  No abdominal pain.  Staying active.  Has been traveling with her sisters.  No chest pain or sob reported.  She is off protonix.  No upper symptoms.  Also off her blood pressure medication.  Blood pressures averaging 104-129/60-70s.  Overall feels good.    Past Medical History:  Diagnosis Date   Anxiety    Chlamydia    Crohn's disease (HCC)    large intestine   GERD (gastroesophageal reflux disease)    Hypertension    Osteopenia    Past Surgical History:  Procedure Laterality Date   COLONOSCOPY WITH PROPOFOL N/A 01/27/2020   Procedure: COLONOSCOPY WITH PROPOFOL;  Surgeon: Regis Bill, MD;  Location: [facility] ENDOSCOPY;  Service: Endoscopy;  Laterality: N/A;   COLONOSCOPY WITH PROPOFOL N/A 07/09/2021   Procedure: COLONOSCOPY WITH PROPOFOL;  Surgeon: Regis Bill, MD;  Location: [facility] ENDOSCOPY;  Service: Endoscopy;  Laterality: N/A;   laparoscopy and tuba repair     orif right calcaneus     TONSILECTOMY/ADENOIDECTOMY WITH MYRINGOTOMY     Family History  Problem Relation Age of Onset   Lymphoma Father    Asthma Mother    Osteoporosis Mother    Mental illness Brother        suicide   Pancreatic cancer Other        uncle   COPD Maternal Grandmother    Breast cancer Neg Hx    Colon cancer Neg Hx    Social History   Socioeconomic History   Marital status: Widowed    Spouse name: Not on file   Number of children: 0   Years of education: Not on file   Highest education level: Master's degree (e.g., MA, MS, MEng, MEd, MSW, MBA)  Occupational History   Not on file  Tobacco Use   Smoking status: Former    Current packs/day: 0.00    Types: Cigarettes    Quit date: 02/09/2002    Years since  quitting: 21.0   Smokeless tobacco: Never  Vaping Use   Vaping status: Never Used  Substance and Sexual Activity   Alcohol use: No    Alcohol/week: 0.0 standard drinks of alcohol   Drug use: No   Sexual activity: Not on file  Other Topics Concern   Not on file  Social History Narrative   Not on file   Social Drivers of Health   Financial Resource Strain: Low Risk  (12/22/2022)   Overall Financial Resource Strain (CARDIA)    Difficulty of Paying Living Expenses: Not hard at all  Food Insecurity: No Food Insecurity (12/22/2022)   Hunger Vital Sign    Worried About Running Out of Food in the Last Year: Never true    Ran Out of Food in the Last Year: Never true  Transportation Needs: No Transportation Needs (12/22/2022)   PRAPARE - Administrator, Civil Service (Medical): No    Lack of Transportation (Non-Medical): No  Physical Activity: Sufficiently Active (12/22/2022)   Exercise Vital Sign    Days of Exercise per Week: 5 days    Minutes of Exercise per Session: 60 min  Stress: No Stress Concern Present (12/22/2022)  [employer] of Occupational Health - Occupational Stress Questionnaire    Feeling of Stress : Only a little  Social Connections: Moderately Isolated (12/22/2022)   Social Connection and Isolation Panel [NHANES]    Frequency of Communication with Friends and Family: Three times a week    Frequency of Social Gatherings with Friends and Family: Twice a week    Attends Religious Services: 1 to 4 times per year    Active Member of [employer] or Organizations: No    Attends Banker Meetings: Not on file    Marital Status: Widowed     Review of Systems  Constitutional:  Negative for appetite change and unexpected weight change.  HENT:  Negative for congestion and sinus pressure.   Respiratory:  Negative for cough, chest tightness and shortness of breath.   Cardiovascular:  Negative for chest pain and palpitations.       No increased leg  swelling.   Gastrointestinal:  Negative for abdominal pain, diarrhea, nausea and vomiting.  Genitourinary:  Negative for difficulty urinating and dysuria.  Musculoskeletal:  Negative for joint swelling and myalgias.  Skin:  Negative for color change and rash.  Neurological:  Negative for dizziness and headaches.  Psychiatric/Behavioral:  Negative for agitation and dysphoric mood.        Objective:     BP 122/70   Pulse 82   Temp 97.9 F (36.6 C)   Resp 16   Ht 5\' 4"  (1.626 m)   Wt 140 lb 3.2 oz (63.6 kg)   LMP 04/12/2010   SpO2 98%   BMI 24.07 kg/m  Wt Readings from Last 3 Encounters:  12/22/22 140 lb 3.2 oz (63.6 kg)  08/21/22 146 lb 3.2 oz (66.3 kg)  04/21/22 152 lb 12.8 oz (69.3 kg)    Physical Exam Vitals reviewed.  Constitutional:      General: She is not in acute distress.    Appearance: Normal appearance. She is well-developed.  HENT:     Head: Normocephalic and atraumatic.     Right Ear: External ear normal.     Left Ear: External ear normal.  Eyes:     General: No scleral icterus.       Right eye: No discharge.        Left eye: No discharge.     Conjunctiva/sclera: Conjunctivae normal.  Neck:     Thyroid: No thyromegaly.  Cardiovascular:     Rate and Rhythm: Normal rate and regular rhythm.  Pulmonary:     Effort: No tachypnea, accessory muscle usage or respiratory distress.     Breath sounds: Normal breath sounds. No decreased breath sounds or wheezing.  Chest:  Breasts:    Right: No inverted nipple, mass, nipple discharge or tenderness (no axillary adenopathy).     Left: No inverted nipple, mass, nipple discharge or tenderness (no axilarry adenopathy).  Abdominal:     General: Bowel sounds are normal.     Palpations: Abdomen is soft.     Tenderness: There is no abdominal tenderness.  Musculoskeletal:        General: No swelling or tenderness.     Cervical back: Neck supple.  Lymphadenopathy:     Cervical: No cervical adenopathy.  Skin:     Findings: No erythema or rash.  Neurological:     Mental Status: She is alert and oriented to person, place, and time.  Psychiatric:        Mood and Affect: Mood normal.  Behavior: Behavior normal.      Outpatient Encounter Medications as of 12/22/2022  Medication Sig   Adalimumab (HUMIRA) 40 MG/0.4ML PSKT Day 1 inject 2 pens SQ, Day 15 inject 1 pen SQ & Day 29 inject 1 pen SQ   B Complex Vitamins (B COMPLEX 50) TABS Take 1 capsule by mouth daily.   cholecalciferol (VITAMIN D) 400 UNITS TABS tablet Take 400 Units by mouth 3 (three) times a week.   fluticasone (FLONASE) 50 MCG/ACT nasal spray 2 sprays each nostril one time per day (in pm).   Multiple Vitamins-Minerals (ALGAE BASED CALCIUM) TABS Take 1 tablet by mouth daily.   zoledronic acid (RECLAST) 5 MG/100ML SOLN injection    zolpidem (AMBIEN) 5 MG tablet Take 0.5 tablets (2.5 mg total) by mouth at bedtime as needed.   [DISCONTINUED] buPROPion (WELLBUTRIN XL) 150 MG 24 hr tablet Take 1 tablet (150 mg total) by mouth daily.   [DISCONTINUED] hydrochlorothiazide (MICROZIDE) 12.5 MG capsule TAKE (1) CAPSULE BY MOUTH ONCE DAILY.   [DISCONTINUED] pantoprazole (PROTONIX) 40 MG tablet Take 1 tablet (40 mg total) by mouth daily.   [DISCONTINUED] valACYclovir (VALTREX) 1000 MG tablet Use as directed.   No facility-administered encounter medications on file as of 12/22/2022.     Lab Results  Component Value Date   WBC 6.3 08/21/2022   HGB 14.2 08/21/2022   HCT 43.0 08/21/2022   PLT 219.0 08/21/2022   GLUCOSE 99 01/05/2023   CHOL 176 01/05/2023   TRIG 84.0 01/05/2023   HDL 66.00 01/05/2023   LDLCALC 93 01/05/2023   ALT 19 01/05/2023   AST 22 01/05/2023   NA 138 01/05/2023   K 4.5 01/05/2023   CL 103 01/05/2023   CREATININE 0.74 01/05/2023   BUN 11 01/05/2023   CO2 29 01/05/2023   TSH 0.82 08/21/2022    MM 3D SCREEN BREAST BILATERAL  Result Date: 03/17/2022 CLINICAL DATA:  Screening. EXAM: DIGITAL SCREENING BILATERAL  MAMMOGRAM WITH TOMOSYNTHESIS AND CAD TECHNIQUE: Bilateral screening digital craniocaudal and mediolateral oblique mammograms were obtained. Bilateral screening digital breast tomosynthesis was performed. The images were evaluated with computer-aided detection. COMPARISON:  Previous exam(s). ACR Breast Density Category c: The breast tissue is heterogeneously dense, which may obscure small masses. FINDINGS: There are no findings suspicious for malignancy. IMPRESSION: No mammographic evidence of malignancy. A result letter of this screening mammogram will be mailed directly to the patient. RECOMMENDATION: Screening mammogram in one year. (Code:SM-B-01Y) BI-RADS CATEGORY  1: Negative. Electronically Signed   By: Ted Mcalpine M.D.   On: 03/17/2022 15:37       Assessment & Plan:  Routine general medical examination at a health care facility  Health care maintenance Assessment & Plan: Physical today 12/22/22.  PAP 9/1/122 - negative (atrophy) with negative HPV.  Mammogram 03/17/22 - Birads I.  Colonoscopy 07/2021.      Osteoporosis without current pathological fracture, unspecified osteoporosis type Assessment & Plan: Continue calcium, vitamin D and weight bearing exercise.  Reclast 03/26/22.     Hypertension, essential Assessment & Plan: Blood pressure as outlined.  Off hydrochlorothiazide. Remain off. Doing well. Follow metabolic panel.    Gastroesophageal reflux disease, unspecified whether esophagitis present Assessment & Plan: No upper symptoms reported.  Off protonix.     Crohn's disease with complication, unspecified gastrointestinal tract location [facility]) Assessment & Plan: Colonoscopy 07/2021- hyperplastic polyp x 3.  Recommended f/u colonoscopy in 3 years. Receiving humira.  Stable.    Anxiety Assessment & Plan: Has previously been dealing with increased  stress.  Has retired.  Appears to be handling things well. Continue wellbutrin.  Follow.        Dale [place], MD

## 2022-12-22 NOTE — Assessment & Plan Note (Addendum)
Physical today 12/22/22.  PAP 9/1/122 - negative (atrophy) with negative HPV.  Mammogram 03/17/22 - Birads I.  Colonoscopy 07/2021.

## 2022-12-28 ENCOUNTER — Encounter: Payer: Self-pay | Admitting: Internal Medicine

## 2022-12-28 NOTE — Assessment & Plan Note (Signed)
Has previously been dealing with increased stress.  Has retired.  Appears to be handling things well. Continue wellbutrin.  Follow.

## 2022-12-28 NOTE — Assessment & Plan Note (Addendum)
Blood pressure as outlined.  Off hydrochlorothiazide. Remain off. Doing well. Follow metabolic panel.

## 2022-12-28 NOTE — Assessment & Plan Note (Signed)
Continue calcium, vitamin D and weight bearing exercise.  Reclast 03/26/22.

## 2022-12-28 NOTE — Assessment & Plan Note (Signed)
Colonoscopy 07/2021- hyperplastic polyp x 3.  Recommended f/u colonoscopy in 3 years. Receiving humira.  Stable.

## 2022-12-28 NOTE — Assessment & Plan Note (Addendum)
No upper symptoms reported.  Off protonix.

## 2022-12-31 ENCOUNTER — Telehealth: Payer: Self-pay | Admitting: Internal Medicine

## 2022-12-31 DIAGNOSIS — M810 Age-related osteoporosis without current pathological fracture: Secondary | ICD-10-CM

## 2022-12-31 DIAGNOSIS — I10 Essential (primary) hypertension: Secondary | ICD-10-CM

## 2022-12-31 NOTE — Telephone Encounter (Signed)
Order placed for fasting labs.   

## 2022-12-31 NOTE — Telephone Encounter (Signed)
Patient need lab orders.

## 2023-01-05 ENCOUNTER — Other Ambulatory Visit (INDEPENDENT_AMBULATORY_CARE_PROVIDER_SITE_OTHER): Payer: Medicare Other

## 2023-01-05 DIAGNOSIS — I10 Essential (primary) hypertension: Secondary | ICD-10-CM | POA: Diagnosis not present

## 2023-01-05 DIAGNOSIS — M810 Age-related osteoporosis without current pathological fracture: Secondary | ICD-10-CM | POA: Diagnosis not present

## 2023-01-05 LAB — LIPID PANEL
Cholesterol: 176 mg/dL (ref 0–200)
HDL: 66 mg/dL (ref >39.00)
LDL Cholesterol: 93 mg/dL (ref 0–99)
NonHDL: 110.11
Total CHOL/HDL Ratio: 3
Triglycerides: 84 mg/dL (ref 0.0–149.0)
VLDL: 16.8 mg/dL (ref 0.0–40.0)

## 2023-01-05 LAB — BASIC METABOLIC PANEL
BUN: 11 mg/dL (ref 6–23)
CO2: 29 meq/L (ref 19–32)
Calcium: 9.7 mg/dL (ref 8.4–10.5)
Chloride: 103 meq/L (ref 96–112)
Creatinine, Ser: 0.74 mg/dL (ref 0.40–1.20)
GFR: 84.24 mL/min (ref >60.00)
Glucose, Bld: 99 mg/dL (ref 70–99)
Potassium: 4.5 meq/L (ref 3.5–5.1)
Sodium: 138 meq/L (ref 135–145)

## 2023-01-05 LAB — HEPATIC FUNCTION PANEL
ALT: 19 U/L (ref 0–35)
AST: 22 U/L (ref 0–37)
Albumin: 4.4 g/dL (ref 3.5–5.2)
Alkaline Phosphatase: 52 U/L (ref 39–117)
Bilirubin, Direct: 0.1 mg/dL (ref 0.0–0.3)
Total Bilirubin: 0.6 mg/dL (ref 0.2–1.2)
Total Protein: 7.3 g/dL (ref 6.0–8.3)

## 2023-01-05 LAB — VITAMIN D 25 HYDROXY (VIT D DEFICIENCY, FRACTURES): VITD: 36.5 ng/mL (ref 30.00–100.00)

## 2023-01-26 ENCOUNTER — Other Ambulatory Visit: Payer: Self-pay | Admitting: Internal Medicine

## 2023-01-27 NOTE — Telephone Encounter (Signed)
Refilled wellbutrin and valtrex.  Please contact her and see if she is still taking hydrochlorothiazide 12.5mg  q day.

## 2023-02-24 ENCOUNTER — Other Ambulatory Visit: Payer: Self-pay | Admitting: Internal Medicine

## 2023-02-24 DIAGNOSIS — Z1231 Encounter for screening mammogram for malignant neoplasm of breast: Secondary | ICD-10-CM

## 2023-03-06 ENCOUNTER — Telehealth: Payer: Self-pay | Admitting: Internal Medicine

## 2023-03-06 NOTE — Telephone Encounter (Signed)
Copied from CRM [id number]. Topic: General - Other >> Mar 05, 2023  3:42 PM [place] A wrote: Reason for CRM: Patient called for billing. Patient said that she called Medicare siad there is a wrong coding on her bill from 12/22/22  This patient was billed for a physical . She has Medicare and [employer]. She should not  have had a physical.

## 2023-03-06 NOTE — Addendum Note (Signed)
Addended by: Charm Barges on: 03/06/2023 07:17 PM   Modules accepted: Level of Service

## 2023-03-25 ENCOUNTER — Ambulatory Visit
Admission: RE | Admit: 2023-03-25 | Discharge: 2023-03-25 | Disposition: A | Discharge status: Home / Self Care | Payer: Medicare Other | Source: Ambulatory Visit | Attending: Internal Medicine | Admitting: Internal Medicine

## 2023-03-25 DIAGNOSIS — Z1231 Encounter for screening mammogram for malignant neoplasm of breast: Secondary | ICD-10-CM | POA: Insufficient documentation

## 2023-04-03 ENCOUNTER — Other Ambulatory Visit: Payer: Self-pay | Admitting: Medical Genetics

## 2023-04-08 ENCOUNTER — Other Ambulatory Visit
Admission: RE | Admit: 2023-04-08 | Discharge: 2023-04-08 | Disposition: A | Discharge status: Home / Self Care | Payer: Self-pay | Source: Ambulatory Visit | Attending: Medical Genetics | Admitting: Medical Genetics

## 2023-04-13 ENCOUNTER — Telehealth: Payer: Medicare Other | Admitting: Physician Assistant

## 2023-04-13 DIAGNOSIS — U071 COVID-19: Secondary | ICD-10-CM

## 2023-04-13 MED ORDER — NIRMATRELVIR/RITONAVIR (PAXLOVID)TABLET: 3.0000 | ORAL_TABLET | Freq: Two times a day (BID) | ORAL | 0 refills | Status: AC

## 2023-04-13 NOTE — Progress Notes (Signed)
Virtual Visit Consent   Lauren Chang, you are scheduled for a virtual visit with a [facility] provider today. Just as with appointments in the office, your consent must be obtained to participate. Your consent will be active for this visit and any virtual visit you may have with one of our providers in the next 365 days. If you have a MyChart account, a copy of this consent can be sent to you electronically.  As this is a virtual visit, video technology does not allow for your provider to perform a traditional examination. This may limit your provider's ability to fully assess your condition. If your provider identifies any concerns that need to be evaluated in person or the need to arrange testing (such as labs, EKG, etc.), we will make arrangements to do so. Although advances in technology are sophisticated, we cannot ensure that it will always work on either your end or our end. If the connection with a video visit is poor, the visit may have to be switched to a telephone visit. With either a video or telephone visit, we are not always able to ensure that we have a secure connection.  By engaging in this virtual visit, you consent to the provision of healthcare and authorize for your insurance to be billed (if applicable) for the services provided during this visit. Depending on your insurance coverage, you may receive a charge related to this service.  I need to obtain your verbal consent now. Are you willing to proceed with your visit today? Lauren Chang has provided verbal consent on 04/13/2023 for a virtual visit (video or telephone). Margaretann Loveless, PA-C  Date: 04/13/2023 5:28 PM  Virtual Visit via Video Note   I, Margaretann Loveless, connected with  Lauren Chang  ([id number], 1947/07/22) on 04/13/23 at  5:15 PM EST by a video-enabled telemedicine application and verified that I am speaking with the correct person using two identifiers.  Location: Patient: Virtual Visit  Location Patient: Home Provider: Virtual Visit Location Provider: Home Office   I discussed the limitations of evaluation and management by telemedicine and the availability of in person appointments. The patient expressed understanding and agreed to proceed.    History of Present Illness: Lauren Chang is a 68 y.o. who identifies as a female who was assigned female at birth, and is being seen today for Covid 42.  HPI: URI  This is a new problem. Episode onset: Tested positive for Covid 19 on Saturday; Symptoms started  on Thursday. The problem has been gradually worsening. The maximum temperature recorded prior to her arrival was 101 - 101.9 F. The fever has been present for 1 to 2 days. Associated symptoms include congestion, coughing (just today), diarrhea, headaches, rhinorrhea, sinus pain and a sore throat (scratchy). Pertinent negatives include no dysuria, ear pain, nausea, plugged ear sensation, sneezing, vomiting or wheezing. She has tried acetaminophen, increased fluids and sleep (benadryl) for the symptoms. The treatment provided no relief.     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   Pain in joint, ankle and foot 11/20/2015   Joint swelling 11/20/2015   Sleeping difficulty 10/16/2014   Sleep  disorder 10/16/2014   Anxiety 08/19/2014   Snoring 05/15/2014   Health care maintenance 05/15/2014   Abnormal respiratory rate 05/15/2014   Calcaneal fracture 03/20/2013   Osteoporosis 03/14/2012   Crohn's disease (HCC) 03/12/2012   GERD (gastroesophageal reflux disease) 03/12/2012    Allergies:  Allergies  Allergen Reactions   Bactrim  [Sulfamethoxazole-Trimethoprim] Nausea Only    Acute pancreatitis   Ciprofloxacin Other (See Comments)    Arthralgias    Ibuprofen Rash   Oxycontin [Oxycodone Hcl] Rash   Medications:  Current Outpatient Medications:    nirmatrelvir/ritonavir (PAXLOVID) 20 x 150 MG & 10 x 100MG  TABS, Take 3 tablets by mouth 2 (two) times daily for 5 days. (Take nirmatrelvir 150 mg two tablets twice daily for 5 days and ritonavir 100 mg one tablet twice daily for 5 days) Patient GFR is 84, Disp: 30 tablet, Rfl: 0   Adalimumab (HUMIRA) 40 MG/0.4ML PSKT, Day 1 inject 2 pens SQ, Day 15 inject 1 pen SQ & Day 29 inject 1 pen SQ, Disp: , Rfl:    B Complex Vitamins (B COMPLEX 50) TABS, Take 1 capsule by mouth daily., Disp: , Rfl:    buPROPion (WELLBUTRIN XL) 150 MG 24 hr tablet, TAKE ONE TABLET BY MOUTH ONCE DAILY, Disp: 90 tablet, Rfl: 3   cholecalciferol (VITAMIN D) 400 UNITS TABS tablet, Take 400 Units by mouth 3 (three) times a week., Disp: , Rfl:    fluticasone (FLONASE) 50 MCG/ACT nasal spray, 2 sprays each nostril one time per day (in pm)., Disp: 16 g, Rfl: 2   Multiple Vitamins-Minerals (ALGAE BASED CALCIUM) TABS, Take 1 tablet by mouth daily., Disp: , Rfl:    valACYclovir (VALTREX) 1000 MG tablet, AS DIRECTED, Disp: 30 tablet, Rfl: 0   zoledronic acid (RECLAST) 5 MG/100ML SOLN injection, , Disp: , Rfl:    zolpidem (AMBIEN) 5 MG tablet, Take 0.5 tablets (2.5 mg total) by mouth at bedtime as needed., Disp: 15 tablet, Rfl: 1  Observations/Objective: Patient is well-developed, well-nourished in no acute distress.  Resting comfortably at home.  Head is normocephalic, atraumatic.  No labored breathing.  Speech is clear and coherent with logical content.  Patient is alert and oriented at baseline.    Assessment and Plan: 1. COVID-19 (Primary) - nirmatrelvir/ritonavir (PAXLOVID) 20 x 150 MG & 10 x 100MG  TABS; Take 3 tablets by mouth 2 (two) times daily for 5 days. (Take nirmatrelvir 150 mg two tablets twice  daily for 5 days and ritonavir 100 mg one tablet twice daily for 5 days) Patient GFR is 84  Dispense: 30 tablet; Refill: 0 - MyChart COVID-19 home monitoring program; Future  - Continue OTC symptomatic management of choice - Will send OTC vitamins and supplement information through AVS - Paxlovid prescribed - Patient enrolled in MyChart symptom monitoring - Push fluids - Rest as needed - Discussed return precautions and when to seek in-person evaluation, sent via AVS as well   Follow Up Instructions: I discussed the assessment and treatment plan with the patient. The patient was provided an opportunity to ask questions and all were answered. The patient agreed with the plan and demonstrated an understanding of the instructions.  A copy of instructions were sent to the patient via MyChart unless otherwise noted below.    The patient was advised to call back or seek an in-person evaluation if the symptoms worsen or if the condition fails to improve as anticipated.    Margaretann Loveless, PA-C

## 2023-04-13 NOTE — Patient Instructions (Signed)
Lauren Chang, thank you for joining Margaretann Loveless, PA-C for today's virtual visit.  While this provider is not your primary care provider (PCP), if your PCP is located in our provider database this encounter information will be shared with them immediately following your visit.   A [facility] MyChart account gives you access to today's visit and all your visits, tests, and labs performed at [facility] " click here if you don't have a [facility] MyChart account or go to [email or web address]  Consent: (Patient) Lauren Chang provided verbal consent for this virtual visit at the beginning of the encounter.  Current Medications:  Current Outpatient Medications:    nirmatrelvir/ritonavir (PAXLOVID) 20 x 150 MG & 10 x 100MG  TABS, Take 3 tablets by mouth 2 (two) times daily for 5 days. (Take nirmatrelvir 150 mg two tablets twice daily for 5 days and ritonavir 100 mg one tablet twice daily for 5 days) Patient GFR is 84, Disp: 30 tablet, Rfl: 0   Adalimumab (HUMIRA) 40 MG/0.4ML PSKT, Day 1 inject 2 pens SQ, Day 15 inject 1 pen SQ & Day 29 inject 1 pen SQ, Disp: , Rfl:    B Complex Vitamins (B COMPLEX 50) TABS, Take 1 capsule by mouth daily., Disp: , Rfl:    buPROPion (WELLBUTRIN XL) 150 MG 24 hr tablet, TAKE ONE TABLET BY MOUTH ONCE DAILY, Disp: 90 tablet, Rfl: 3   cholecalciferol (VITAMIN D) 400 UNITS TABS tablet, Take 400 Units by mouth 3 (three) times a week., Disp: , Rfl:    fluticasone (FLONASE) 50 MCG/ACT nasal spray, 2 sprays each nostril one time per day (in pm)., Disp: 16 g, Rfl: 2   Multiple Vitamins-Minerals (ALGAE BASED CALCIUM) TABS, Take 1 tablet by mouth daily., Disp: , Rfl:    valACYclovir (VALTREX) 1000 MG tablet, AS DIRECTED, Disp: 30 tablet, Rfl: 0   zoledronic acid (RECLAST) 5 MG/100ML SOLN injection, , Disp: , Rfl:    zolpidem (AMBIEN) 5 MG tablet, Take 0.5 tablets (2.5 mg total) by mouth at bedtime as needed., Disp: 15 tablet, Rfl: 1    Medications ordered in this encounter:  Meds ordered this encounter  Medications   nirmatrelvir/ritonavir (PAXLOVID) 20 x 150 MG & 10 x 100MG  TABS    Sig: Take 3 tablets by mouth 2 (two) times daily for 5 days. (Take nirmatrelvir 150 mg two tablets twice daily for 5 days and ritonavir 100 mg one tablet twice daily for 5 days) Patient GFR is 84    Dispense:  30 tablet    Refill:  0    Supervising Provider:   Merrilee Jansky [phone]     *If you need refills on other medications prior to your next appointment, please contact your pharmacy*  Follow-Up: Call back or seek an in-person evaluation if the symptoms worsen or if the condition fails to improve as anticipated.  [facility] [phone]  Care Instructions: Can take to lessen severity: Vit C 500mg  twice daily Quercertin 250-500mg  twice daily Zinc 75-100mg  daily Melatonin 3-6 mg at bedtime Vit D3 1000-2000 IU daily Aspirin 81 mg daily with food Optional: Famotidine 20mg  daily Also can add tylenol/ibuprofen as needed for fevers and body aches May add Mucinex or Mucinex DM as needed for cough/congestion    Isolation Instructions: You are to isolate at home until you have been fever free for at least 24 hours without a fever-reducing medication, and symptoms have been steadily improving for 24 hours. At that time,  you can end isolation but need to mask for an additional 5 days.   If you must be around other household members who do not have symptoms, you need to make sure that both you and the family members are masking consistently with a high-quality mask.  If you note any worsening of symptoms despite treatment, please seek an in-person evaluation ASAP. If you note any significant shortness of breath or any chest pain, please seek ER evaluation. Please do not delay care!   COVID-19: What to Do if You Are Sick If you test positive and are an older adult or someone who is at high risk of getting very sick  from COVID-19, treatment may be available. Contact a healthcare provider right away after a positive test to determine if you are eligible, even if your symptoms are mild right now. You can also visit a Test to Treat location and, if eligible, receive a prescription from a provider. Don't delay: Treatment must be started within the first few days to be effective. If you have a fever, cough, or other symptoms, you might have COVID-19. Most people have mild illness and are able to recover at home. If you are sick: Keep track of your symptoms. If you have an emergency warning sign (including trouble breathing), call 911. Steps to help prevent the spread of COVID-19 if you are sick If you are sick with COVID-19 or think you might have COVID-19, follow the steps below to care for yourself and to help protect other people in your home and community. Stay home except to get medical care Stay home. Most people with COVID-19 have mild illness and can recover at home without medical care. Do not leave your home, except to get medical care. Do not visit public areas and do not go to places where you are unable to wear a mask. Take care of yourself. Get rest and stay hydrated. Take over-the-counter medicines, such as acetaminophen, to help you feel better. Stay in touch with your doctor. Call before you get medical care. Be sure to get care if you have trouble breathing, or have any other emergency warning signs, or if you think it is an emergency. Avoid public transportation, ride-sharing, or taxis if possible. Get tested If you have symptoms of COVID-19, get tested. While waiting for test results, stay away from others, including staying apart from those living in your household. Get tested as soon as possible after your symptoms start. Treatments may be available for people with COVID-19 who are at risk for becoming very sick. Don't delay: Treatment must be started early to be effective--some treatments must  begin within 5 days of your first symptoms. Contact your healthcare provider right away if your test result is positive to determine if you are eligible. Self-tests are one of several options for testing for the virus that causes COVID-19 and may be more convenient than laboratory-based tests and point-of-care tests. Ask your healthcare provider or your local health department if you need help interpreting your test results. You can visit your state, tribal, local, and territorial health department's website to look for the latest local information on testing sites. Separate yourself from other people As much as possible, stay in a specific room and away from other people and pets in your home. If possible, you should use a separate bathroom. If you need to be around other people or animals in or outside of the home, wear a well-fitting mask. Tell your close contacts that they may  have been exposed to COVID-19. An infected person can spread COVID-19 starting 48 hours (or 2 days) before the person has any symptoms or tests positive. By letting your close contacts know they may have been exposed to COVID-19, you are helping to protect everyone. See COVID-19 and Animals if you have questions about pets. If you are diagnosed with COVID-19, someone from the health department may call you. Answer the call to slow the spread. Monitor your symptoms Symptoms of COVID-19 include fever, cough, or other symptoms. Follow care instructions from your healthcare provider and local health department. Your local health authorities may give instructions on checking your symptoms and reporting information. When to seek emergency medical attention Look for emergency warning signs* for COVID-19. If someone is showing any of these signs, seek emergency medical care immediately: Trouble breathing Persistent pain or pressure in the chest New confusion Inability to wake or stay awake Pale, gray, or blue-colored skin, lips,  or nail beds, depending on skin tone *This list is not all possible symptoms. Please call your medical provider for any other symptoms that are severe or concerning to you. Call 911 or call ahead to your local emergency facility: Notify the operator that you are seeking care for someone who has or may have COVID-19. Call ahead before visiting your doctor Call ahead. Many medical visits for routine care are being postponed or done by phone or telemedicine. If you have a medical appointment that cannot be postponed, call your doctor's office, and tell them you have or may have COVID-19. This will help the office protect themselves and other patients. If you are sick, wear a well-fitting mask You should wear a mask if you must be around other people or animals, including pets (even at home). Wear a mask with the best fit, protection, and comfort for you. You don't need to wear the mask if you are alone. If you can't put on a mask (because of trouble breathing, for example), cover your coughs and sneezes in some other way. Try to stay at least 6 feet away from other people. This will help protect the people around you. Masks should not be placed on young children under age 34 years, anyone who has trouble breathing, or anyone who is not able to remove the mask without help. Cover your coughs and sneezes Cover your mouth and nose with a tissue when you cough or sneeze. Throw away used tissues in a lined trash can. Immediately wash your hands with soap and water for at least 20 seconds. If soap and water are not available, clean your hands with an alcohol-based hand sanitizer that contains at least 60% alcohol. Clean your hands often Wash your hands often with soap and water for at least 20 seconds. This is especially important after blowing your nose, coughing, or sneezing; going to the bathroom; and before eating or preparing food. Use hand sanitizer if soap and water are not available. Use an  alcohol-based hand sanitizer with at least 60% alcohol, covering all surfaces of your hands and rubbing them together until they feel dry. Soap and water are the best option, especially if hands are visibly dirty. Avoid touching your eyes, nose, and mouth with unwashed hands. Handwashing Tips Avoid sharing personal household items Do not share dishes, drinking glasses, cups, eating utensils, towels, or bedding with other people in your home. Wash these items thoroughly after using them with soap and water or put in the dishwasher. Clean surfaces in your home regularly Clean  and disinfect high-touch surfaces (for example, doorknobs, tables, handles, light switches, and countertops) in your "sick room" and bathroom. In shared spaces, you should clean and disinfect surfaces and items after each use by the person who is ill. If you are sick and cannot clean, a caregiver or other person should only clean and disinfect the area around you (such as your bedroom and bathroom) on an as needed basis. Your caregiver/other person should wait as long as possible (at least several hours) and wear a mask before entering, cleaning, and disinfecting shared spaces that you use. Clean and disinfect areas that may have blood, stool, or body fluids on them. Use household cleaners and disinfectants. Clean visible dirty surfaces with household cleaners containing soap or detergent. Then, use a household disinfectant. Use a product from [employer] List N: Disinfectants for Coronavirus (COVID-19). Be sure to follow the instructions on the label to ensure safe and effective use of the product. Many products recommend keeping the surface wet with a disinfectant for a certain period of time (look at "contact time" on the product label). You may also need to wear personal protective equipment, such as gloves, depending on the directions on the product label. Immediately after disinfecting, wash your hands with soap and water for 20  seconds. For completed guidance on cleaning and disinfecting your home, visit Complete Disinfection Guidance. Take steps to improve ventilation at home Improve ventilation (air flow) at home to help prevent from spreading COVID-19 to other people in your household. Clear out COVID-19 virus particles in the air by opening windows, using air filters, and turning on fans in your home. Use this interactive tool to learn how to improve air flow in your home. When you can be around others after being sick with COVID-19 Deciding when you can be around others is different for different situations. Find out when you can safely end home isolation. For any additional questions about your care, contact your healthcare provider or state or local health department. 05/29/2020 Content source: [facility] Immunization and Res[facility] ([facility]), Division of Viral Diseases This information is not intended to replace advice given to you by your health care provider. Make sure you discuss any questions you have with your health care provider. Document Revised: 07/12/2020 Document Reviewed: 07/12/2020 Elsevier Patient Education  2022 [employer].     If you have been instructed to have an in-person evaluation today at a local Urgent Care facility, please use the link below. It will take you to a list of all of our available [facility], including address, phone number and hours of operation. Please do not delay care.  [facility]  If you or a family member do not have a primary care provider, use the link below to schedule a visit and establish care. When you choose a [facility] primary care physician or advanced practice provider, you gain a long-term partner in health. Find a Primary Care Provider  Learn more about [facility]'s in-office and virtual care options: [facility] - Get Care Now

## 2023-04-19 LAB — GENECONNECT MOLECULAR SCREEN: Genetic Analysis Overall Interpretation: NEGATIVE

## 2023-04-27 ENCOUNTER — Ambulatory Visit: Payer: Medicare Other | Admitting: Internal Medicine

## 2023-04-29 ENCOUNTER — Telehealth (INDEPENDENT_AMBULATORY_CARE_PROVIDER_SITE_OTHER): Payer: Medicare Other | Admitting: Internal Medicine

## 2023-04-29 ENCOUNTER — Encounter: Payer: Self-pay | Admitting: Internal Medicine

## 2023-04-29 VITALS — Ht 64.0 in | Wt 140.0 lb

## 2023-04-29 DIAGNOSIS — I10 Essential (primary) hypertension: Secondary | ICD-10-CM

## 2023-04-29 DIAGNOSIS — F419 Anxiety disorder, unspecified: Secondary | ICD-10-CM

## 2023-04-29 DIAGNOSIS — K50919 Crohn's disease, unspecified, with unspecified complications: Secondary | ICD-10-CM

## 2023-04-29 DIAGNOSIS — M810 Age-related osteoporosis without current pathological fracture: Secondary | ICD-10-CM | POA: Diagnosis not present

## 2023-04-29 DIAGNOSIS — K219 Gastro-esophageal reflux disease without esophagitis: Secondary | ICD-10-CM

## 2023-04-29 NOTE — Progress Notes (Signed)
 Patient ID: Lauren Chang, female   DOB: 26-Aug-1955, 68 y.o.   MRN: [id number]   Virtual Visit via video Note  I connected with Lauren Chang by a video enabled telemedicine application and verified that I am speaking with the correct person using two identifiers. Location patient: home Location provider: home Persons participating in the virtual visit: patient, provider  The limitations, risks, security and privacy concerns of performing an evaluation and management service by video and the availability of in person appointments have been discussed. It has also been discussed with the patient that there may be a patient responsible charge related to this service. The patient expressed understanding and agreed to proceed.  Reason for visit: follow up appt  HPI: Follow up appt - f/u regarding crohn's - on humira and f/u regarding increased stress. Seeing endocrinology for f/u regarding osteoporosis. Receiving reclast. Last DEXA 02/12/23 - interval improvement in her BMD when compared to 10/2020. Lowest T-score was -2.4 at the left femoral neck. Her FRAX scores were 4% and 20% at the hip and overall at 10 years, respectively. Off blood pressure medication. Blood pressure doing well.  Recent readings - 118/66, 113/68 and 115/71. Off protonix. No acid reflux. Had covid recently. Noticed some increased liquid stool then. This has improved. Overall from a bowel standpoint, she feels things are stable. Continuing on humira. Seeing a therapist weekly. This is going well. Handling stress. Continues on wellbutrin. Will notify me if feels needs to change or adjust medication.    ROS: See pertinent positives and negatives per HPI.  Past Medical History:  Diagnosis Date   Anxiety    Chlamydia    Crohn's disease (HCC)    large intestine   GERD (gastroesophageal reflux disease)    Hypertension    Osteopenia     Past Surgical History:  Procedure Laterality Date   COLONOSCOPY WITH PROPOFOL N/A  01/27/2020   Procedure: COLONOSCOPY WITH PROPOFOL;  Surgeon: Regis Bill, MD;  Location: [facility] ENDOSCOPY;  Service: Endoscopy;  Laterality: N/A;   COLONOSCOPY WITH PROPOFOL N/A 07/09/2021   Procedure: COLONOSCOPY WITH PROPOFOL;  Surgeon: Regis Bill, MD;  Location: [facility] ENDOSCOPY;  Service: Endoscopy;  Laterality: N/A;   laparoscopy and tuba repair     orif right calcaneus     TONSILECTOMY/ADENOIDECTOMY WITH MYRINGOTOMY      Family History  Problem Relation Age of Onset   Lymphoma Father    Asthma Mother    Osteoporosis Mother    Mental illness Brother        suicide   Pancreatic cancer Other        uncle   COPD Maternal Grandmother    Breast cancer Neg Hx    Colon cancer Neg Hx     SOCIAL HX: reviewed.    Current Outpatient Medications:    Adalimumab (HUMIRA) 40 MG/0.4ML PSKT, Day 1 inject 2 pens SQ, Day 15 inject 1 pen SQ & Day 29 inject 1 pen SQ, Disp: , Rfl:    B Complex Vitamins (B COMPLEX 50) TABS, Take 1 capsule by mouth daily., Disp: , Rfl:    buPROPion (WELLBUTRIN XL) 150 MG 24 hr tablet, TAKE ONE TABLET BY MOUTH ONCE DAILY, Disp: 90 tablet, Rfl: 3   cholecalciferol (VITAMIN D) 400 UNITS TABS tablet, Take 400 Units by mouth 3 (three) times a week., Disp: , Rfl:    fluticasone (FLONASE) 50 MCG/ACT nasal spray, 2 sprays each nostril one time per day (in pm)., Disp: 16 g, Rfl:  2   Multiple Vitamins-Minerals (ALGAE BASED CALCIUM) TABS, Take 1 tablet by mouth daily., Disp: , Rfl:    valACYclovir (VALTREX) 1000 MG tablet, AS DIRECTED, Disp: 30 tablet, Rfl: 0   zoledronic acid (RECLAST) 5 MG/100ML SOLN injection, , Disp: , Rfl:   EXAM:  VITALS per patient if applicable: 118/66  GENERAL: alert, oriented, appears well and in no acute distress  HEENT: atraumatic, conjunttiva clear, no obvious abnormalities on inspection of external nose and ears  NECK: normal movements of the head and neck  LUNGS: on inspection no signs of respiratory distress, breathing rate  appears normal, no obvious gross SOB, gasping or wheezing  CV: no obvious cyanosis  PSYCH/NEURO: pleasant and cooperative, no obvious depression or anxiety, speech and thought processing grossly intact  ASSESSMENT AND PLAN:  Discussed the following assessment and plan:  Problem List Items Addressed This Visit     Anxiety   Has retired. Overall appears to be doing well. Continue wellbutrin. Follow.       Crohn's disease (HCC)   Colonoscopy 07/2021 - hyperplastic polyp x 3. Recommended f/u colonoscopy in 3 years. Receiving humira. Stable. Some loose stool with covid. - overall stable now.       GERD (gastroesophageal reflux disease)   Off protonix. No acid relfux.       Hypertension, essential   Off hydrochlorothiazide. Continue to monitor pressure.       Osteoporosis - Primary   Seeing endocrinology for f/u regarding osteoporosis. Receiving reclast. Last DEXA 02/12/23 - interval improvement in her BMD when compared to 10/2020. Lowest T-score was -2.4 at the left femoral neck. Her FRAX scores were 4% and 20% at the hip and overall at 10 years, respectively.        No follow-ups on file.   I discussed the assessment and treatment plan with the patient. The patient was provided an opportunity to ask questions and all were answered. The patient agreed with the plan and demonstrated an understanding of the instructions.   The patient was advised to call back or seek an in-person evaluation if the symptoms worsen or if the condition fails to improve as anticipated.    Dale Rosemont, MD

## 2023-04-30 ENCOUNTER — Ambulatory Visit: Payer: Medicare Other | Admitting: Internal Medicine

## 2023-05-02 NOTE — Assessment & Plan Note (Signed)
 Colonoscopy 07/2021 - hyperplastic polyp x 3. Recommended f/u colonoscopy in 3 years. Receiving humira. Stable. Some loose stool with covid. - overall stable now.

## 2023-05-02 NOTE — Assessment & Plan Note (Signed)
 Off protonix. No acid relfux.

## 2023-05-02 NOTE — Assessment & Plan Note (Signed)
 Has retired. Overall appears to be doing well. Continue wellbutrin. Follow.

## 2023-05-02 NOTE — Assessment & Plan Note (Signed)
 Off hydrochlorothiazide. Continue to monitor pressure.

## 2023-05-02 NOTE — Assessment & Plan Note (Signed)
 Seeing endocrinology for f/u regarding osteoporosis. Receiving reclast. Last DEXA 02/12/23 - interval improvement in her BMD when compared to 10/2020. Lowest T-score was -2.4 at the left femoral neck. Her FRAX scores were 4% and 20% at the hip and overall at 10 years, respectively.

## 2023-05-03 ENCOUNTER — Telehealth: Payer: Self-pay | Admitting: Internal Medicine

## 2023-05-03 NOTE — Telephone Encounter (Signed)
 Please schedule a f/u appt in 4 months.

## 2023-07-17 ENCOUNTER — Ambulatory Visit (INDEPENDENT_AMBULATORY_CARE_PROVIDER_SITE_OTHER): Admitting: *Deleted

## 2023-07-17 VITALS — BP 116/64 | Ht 64.0 in | Wt 133.0 lb

## 2023-07-17 DIAGNOSIS — Z0000 Encounter for general adult medical examination without abnormal findings: Secondary | ICD-10-CM | POA: Diagnosis not present

## 2023-07-17 NOTE — Patient Instructions (Signed)
 Lauren Chang , Thank you for taking time out of your busy schedule to complete your Annual Wellness Visit with me. I enjoyed our conversation and look forward to speaking with you again next year. I, as well as your care team,  appreciate your ongoing commitment to your health goals. Please review the following plan we discussed and let me know if I can assist you in the future. Your Game plan/ To Do List    Referrals: If you haven't heard from the office you've been referred to, please reach out to them at the phone provided.  Continue to update your covid vaccines.  Follow up Visits: Next Medicare AWV with our clinical staff: 07/22/24 @ 9:30   Have you seen your provider in the last 6 months (3 months if uncontrolled diabetes)? No Next Office Visit with your provider: 09/01/23  Clinician Recommendations:  Aim for 30 minutes of exercise or brisk walking, 6-8 glasses of water, and 5 servings of fruits and vegetables each day. yes      This is a list of the screening recommended for you and due dates:  Health Maintenance  Topic Date Due   Pneumonia Vaccine (2 of 2 - PPSV23) 06/12/2021   COVID-19 Vaccine (7 - 2024-25 season) 05/31/2023   Flu Shot  10/09/2023   Mammogram  03/24/2024   Colon Cancer Screening  07/09/2024   Medicare Annual Wellness Visit  07/16/2024   DEXA scan (bone density measurement)  06/16/2027   DTaP/Tdap/Td vaccine (2 - Td or Tdap) 04/23/2028   Hepatitis C Screening  Completed   Zoster (Shingles) Vaccine  Completed   HPV Vaccine  Aged Out   Meningitis B Vaccine  Aged Out    Advanced directives: (Declined) Advance directive discussed with you today. Even though you declined this today, please call our office should you change your mind, and we can give you the proper paperwork for you to fill out. Advance Care Planning is important because it: Patient stated that she is working on this.  [x]  Makes sure you receive the medical care that is consistent with your values,  goals, and preferences  [x]  It provides guidance to your family and loved ones and reduces their decisional burden about whether or not they are making the right decisions based on your wishes.  Follow the link provided in your after visit summary or read over the paperwork we have mailed to you to help you started getting your Advance Directives in place. If you need assistance in completing these, please reach out to us  so that we can help you!

## 2023-07-17 NOTE — Progress Notes (Signed)
 Subjective:   Lauren Chang is a 68 y.o. who presents for a Medicare Wellness preventive visit.  As a reminder, Annual Wellness Visits don't include a physical exam, and some assessments may be limited, especially if this visit is performed virtually. We may recommend an in-person visit if needed.  Visit Complete: Virtual I connected with  Lauren Chang on 07/17/23 by a video and audio enabled telemedicine application and verified that I am speaking with the correct person using two identifiers.  Patient Location: Home  Provider Location: Office/Clinic  I discussed the limitations of evaluation and management by telemedicine. The patient expressed understanding and agreed to proceed.  Vital Signs: Because this visit was a virtual/telehealth visit, some criteria may be missing or patient reported. Any vitals not documented were not able to be obtained and vitals that have been documented are patient reported.  Persons Participating in Visit: Patient.  AWV Questionnaire: Yes: Patient Medicare AWV questionnaire was completed by the patient on 07/16/23; I have confirmed that all information answered by patient is correct and no changes since this date.  Cardiac Risk Factors include: advanced age (>66men, >47 women);hypertension (under control no medications)     Objective:     Today's Vitals   07/17/23 0843  BP: 116/64  Weight: 133 lb (60.3 kg)  Height: 5\' 4"  (1.626 m)   Body mass index is 22.83 kg/m.     07/17/2023    8:56 AM 07/09/2021    7:26 AM 01/27/2020    1:20 PM 08/12/2014   12:04 PM  Advanced Directives  Does Patient Have a Medical Advance Directive? No No No No  Would patient like information on creating a medical advance directive? No - Patient declined No - Patient declined No - Patient declined Yes - Educational materials given    Current Medications (verified) Outpatient Encounter Medications as of 07/17/2023  Medication Sig   Adalimumab (HUMIRA) 40  MG/0.4ML PSKT Day 1 inject 2 pens SQ, Day 15 inject 1 pen SQ & Day 29 inject 1 pen SQ   B Complex Vitamins (B COMPLEX 50) TABS Take 1 capsule by mouth daily.   buPROPion  (WELLBUTRIN  XL) 150 MG 24 hr tablet TAKE ONE TABLET BY MOUTH ONCE DAILY   cholecalciferol (VITAMIN D ) 400 UNITS TABS tablet Take 400 Units by mouth 3 (three) times a week.   fluticasone  (FLONASE ) 50 MCG/ACT nasal spray 2 sprays each nostril one time per day (in pm).   Multiple Vitamins-Minerals (ALGAE BASED CALCIUM) TABS Take 1 tablet by mouth daily.   valACYclovir  (VALTREX ) 1000 MG tablet AS DIRECTED   zoledronic acid (RECLAST) 5 MG/100ML SOLN injection    No facility-administered encounter medications on file as of 07/17/2023.    Allergies (verified) Bactrim [sulfamethoxazole-trimethoprim], Ciprofloxacin, Ibuprofen, and Oxycontin [oxycodone hcl]   History: Past Medical History:  Diagnosis Date   Anxiety    Chlamydia    Crohn's disease (HCC)    large intestine   GERD (gastroesophageal reflux disease)    Hypertension    Osteopenia    Past Surgical History:  Procedure Laterality Date   COLONOSCOPY WITH PROPOFOL  N/A 01/27/2020   Procedure: COLONOSCOPY WITH PROPOFOL ;  Surgeon: Shane Darling, MD;  Location: [facility];  Service: Endoscopy;  Laterality: N/A;   COLONOSCOPY WITH PROPOFOL  N/A 07/09/2021   Procedure: COLONOSCOPY WITH PROPOFOL ;  Surgeon: Shane Darling, MD;  Location: [facility] END[facility]COPY;  Service: Endoscopy;  Laterality: N/A;   laparoscopy and tuba repair     orif right calcaneus  TONSILECTOMY/ADENOIDECTOMY WITH MYRINGOTOMY     Family History  Problem Relation Age of Onset   Lymphoma Father    Asthma Mother    Osteoporosis Mother    Mental illness Brother        suicide   Pancreatic cancer Other        uncle   COPD Maternal Grandmother    Breast cancer Neg Hx    Colon cancer Neg Hx    Social History   Socioeconomic History   Marital status: Widowed    Spouse name: Not on file    Number of children: 0   Years of education: Not on file   Highest education level: Master's degree (e.g., MA, MS, MEng, MEd, MSW, MBA)  Occupational History   Not on file  Tobacco Use   Smoking status: Former    Current packs/day: 0.00    Types: Cigarettes    Quit date: 02/09/2002    Years since quitting: 21.4   Smokeless tobacco: Never  Vaping Use   Vaping status: Never Used  Substance and Sexual Activity   Alcohol use: No    Alcohol/week: 0.0 standard drinks of alcohol   Drug use: No   Sexual activity: Not on file  Other Topics Concern   Not on file  Social History Narrative   reitred   Social Drivers of Health   Financial Resource Strain: Low Risk  (07/17/2023)   Overall Financial Resource Strain (CARDIA)    Difficulty of Paying Living Expenses: Not hard at all  Food Insecurity: No Food Insecurity (07/17/2023)   Hunger Vital Sign    Worried About Running Out of Food in the Last Year: Never true    Ran Out of Food in the Last Year: Never true  Transportation Needs: No Transportation Needs (07/17/2023)   PRAPARE - Administrator, Civil Service (Medical): No    Lack of Transportation (Non-Medical): No  Physical Activity: Insufficiently Active (07/17/2023)   Exercise Vital Sign    Days of Exercise per Week: 4 days    Minutes of Exercise per Session: 30 min  Stress: No Stress Concern Present (07/17/2023)   [facility]ren [facility] [employer] - Occupational Stress Questionnaire    Feeling of Stress : Only a little  Social Connections: Moderately Isolated (07/17/2023)   Social Connection and Isolation Panel [NHANES]    Frequency of Communication with Friends and Family: More than three times a week    Frequency of Social Gatherings with Friends and Family: Once a week    Attends Religious Services: Never    Database administrator or Organizations: Yes    Attends Engineer, structural: More than 4 times per year    Marital Status: Widowed    Tobacco  Counseling Counseling given: Not Answered    Clinical Intake:  Pre-visit preparation completed: Yes  Pain : No/denies pain     BMI - recorded: 22.83 Nutritional Status: BMI of 19-24  Normal Nutritional Risks: None Diabetes: No  No results found for: "HGBA1C"   How often do you need to have someone help you when you read instructions, pamphlets, or other written materials from your doctor or pharmacy?: 1 - Never  Interpreter Needed?: No  Information entered by :: R.. Sharrell Krawiec LPN   Activities of Daily Living     07/16/2023   11:55 AM  In your present state of health, do you have any difficulty performing the following activities:  Hearing? 0  Vision? 0  Difficulty  concentrating or making decisions? 0  Walking or climbing stairs? 0  Dressing or bathing? 0  Doing errands, shopping? 0  Preparing Food and eating ? N  Using the Toilet? N  In the past six months, have you accidently leaked urine? N  Do you have problems with loss of bowel control? N  Managing your Medications? N  Managing your Finances? N  Housekeeping or managing your Housekeeping? N    Patient Care Team: Dellar Fenton, MD as PCP - General (Internal Medicine)  Indicate any recent Medical Services you may have received from other than [facility] providers in the past year (date may be approximate).     Assessment:    This is a routine wellness examination for Lauren Chang.  Hearing/Vision screen Hearing Screening - Comments:: No issues Vision Screening - Comments:: glasses   Goals Addressed             This Visit's Progress    Patient Stated       Wants to travel more       Depression Screen     07/17/2023    8:52 AM 04/29/2023    8:25 AM 12/22/2022    2:28 PM 01/17/2022    4:41 PM 12/18/2021   10:41 AM 09/16/2021    9:25 AM 08/07/2020    3:54 PM  PHQ 2/9 Scores  PHQ - 2 Score 0 0 0 0 0 1 0  PHQ- 9 Score 0 0 0        Fall Risk     07/16/2023   11:55 AM 04/29/2023    8:25 AM 12/22/2022     2:28 PM 01/17/2022    4:40 PM 12/18/2021   10:40 AM  Fall Risk   Falls in the past year? 1 0 0 0 0  Number falls in past yr: 0 0 0    Injury with Fall? 0 0 0    Risk for fall due to : History of fall(s);Impaired balance/gait No Fall Risks No Fall Risks No Fall Risks   Risk for fall due to: Comment while walking dog      Follow up Falls evaluation completed;Falls prevention discussed Falls evaluation completed Falls evaluation completed Falls evaluation completed     MEDICARE RISK AT HOME:  Medicare Risk at Home Any stairs in or around the home?: (Patient-Rptd) Yes If so, are there any without handrails?: (Patient-Rptd) No Home free of loose throw rugs in walkways, pet beds, electrical cords, etc?: Yes Adequate lighting in your home to reduce risk of falls?: (Patient-Rptd) Yes Life alert?: (Patient-Rptd) No Use of a cane, walker or w/c?: (Patient-Rptd) No Grab bars in the bathroom?: (Patient-Rptd) No Shower chair or bench in shower?: (Patient-Rptd) No Elevated toilet seat or a handicapped toilet?: (Patient-Rptd) No  TIMED UP AND GO:  Was the test performed?  No  Cognitive Function: 6CIT completed        07/17/2023    8:57 AM  6CIT Screen  What Year? 0 points  What month? 0 points  What time? 0 points  Count back from 20 0 points  Months in reverse 0 points  Repeat phrase 0 points  Total Score 0 points    Immunizations Immunization History  Administered Date(s) Administered   Fluad Quad(high Dose 65+) 12/05/2021   Influenza Split 11/11/2011   Influenza, High Dose Seasonal PF 12/01/2022   Influenza,inj,Quad PF,6+ Mos 03/18/2013, 02/15/2015, 11/06/2017, 12/30/2018, 11/08/2020   Influenza-Unspecified 12/18/2019   Moderna Covid-19 Fall Seasonal Vaccine 19yrs & older 12/01/2022  Moderna Sars-Covid-2 Vaccination 03/31/2019, 05/04/2019, 01/18/2020, 06/29/2020   PFIZER(Purple Top)SARS-COV-2 Vaccination 12/05/2021   Pneumococcal Conjugate-13 06/12/2020   Tdap 04/23/2018    Zoster Recombinant(Shingrix) 03/23/2020, 05/19/2020    Screening Tests Health Maintenance  Topic Date Due   Medicare Annual Wellness (AWV)  Never done   Pneumonia Vaccine 55+ Years old (2 of 2 - PPSV23) 06/12/2021   COVID-19 Vaccine (7 - 2024-25 season) 05/31/2023   INFLUENZA VACCINE  10/09/2023   MAMMOGRAM  03/24/2024   DEXA SCAN  06/16/2027   DTaP/Tdap/Td (2 - Td or Tdap) 04/23/2028   Colonoscopy  07/10/2031   Hepatitis C Screening  Completed   Zoster Vaccines- Shingrix  Completed   HPV VACCINES  Aged Out   Meningococcal B Vaccine  Aged Out    Health Maintenance  Health Maintenance Due  Topic Date Due   Medicare Annual Wellness (AWV)  Never done   Pneumonia Vaccine 61+ Years old (2 of 2 - PPSV23) 06/12/2021   COVID-19 Vaccine (7 - 2024-25 season) 05/31/2023   Health Maintenance Items Addressed: Discussed the need to continue to update Covid vaccine. Patient is followed by Dr. Lorelei Rogers for her Dexa scans.  Additional Screening:  Vision Screening: Recommended annual ophthalmology exams for early detection of glaucoma and other disorders of the eye.Up to date [facility] Screening: Recommended annual dental exams for proper oral hygiene  Community Resource Referral / Chronic Care Management: CRR required this visit?  No   CCM required this visit?  No   Plan:    I have personally reviewed and noted the following in the patient's chart:   Medical and social history Use of alcohol, tobacco or illicit drugs  Current medications and supplements including opioid prescriptions. Patient is not currently taking opioid prescriptions. Functional ability and status Nutritional status Physical activity Advanced directives List of other physicians Hospitalizations, surgeries, and ER visits in previous 12 months Vitals Screenings to include cognitive, depression, and falls Referrals and appointments  In addition, I have reviewed and discussed with patient  certain preventive protocols, quality metrics, and best practice recommendations. A written personalized care plan for preventive services as well as general preventive health recommendations were provided to patient.   Felicitas Horse, LPN   03/10/9145   After Visit Summary: (MyChart) Due to this being a telephonic visit, the after visit summary with patients personalized plan was offered to patient via MyChart   Notes: Nothing significant to report at this time.

## 2023-09-01 ENCOUNTER — Ambulatory Visit (INDEPENDENT_AMBULATORY_CARE_PROVIDER_SITE_OTHER): Payer: Medicare Other | Admitting: Internal Medicine

## 2023-09-01 ENCOUNTER — Encounter: Payer: Self-pay | Admitting: Internal Medicine

## 2023-09-01 VITALS — BP 118/68 | HR 74 | Temp 97.9°F | Resp 16 | Ht 64.0 in | Wt 131.4 lb

## 2023-09-01 DIAGNOSIS — I10 Essential (primary) hypertension: Secondary | ICD-10-CM | POA: Diagnosis not present

## 2023-09-01 DIAGNOSIS — K50919 Crohn's disease, unspecified, with unspecified complications: Secondary | ICD-10-CM

## 2023-09-01 DIAGNOSIS — F419 Anxiety disorder, unspecified: Secondary | ICD-10-CM

## 2023-09-01 DIAGNOSIS — M810 Age-related osteoporosis without current pathological fracture: Secondary | ICD-10-CM

## 2023-09-01 LAB — LIPID PANEL
Cholesterol: 196 mg/dL (ref 0–200)
HDL: 63.8 mg/dL (ref >39.00)
LDL Cholesterol: 118 mg/dL — ABNORMAL HIGH (ref 0–99)
NonHDL: 132.38
Total CHOL/HDL Ratio: 3
Triglycerides: 72 mg/dL (ref 0.0–149.0)
VLDL: 14.4 mg/dL (ref 0.0–40.0)

## 2023-09-01 LAB — TSH: TSH: 0.73 u[IU]/mL (ref 0.35–5.50)

## 2023-09-01 NOTE — Assessment & Plan Note (Signed)
 Seeing endocrinology for f/u regarding osteoporosis. Receiving reclast. Last DEXA 02/12/23 - interval improvement in her BMD when compared to 10/2020. Lowest T-score was -2.4 at the left femoral neck. Her FRAX scores were 4% and 20% at the hip and overall at 10 years, respectively.

## 2023-09-01 NOTE — Progress Notes (Signed)
 Subjective:    Patient ID: Lauren Chang, female    DOB: 1955/11/05, 68 y.o.   MRN: [id number]  Patient here for  Chief Complaint  Patient presents with   Medical Management of Chronic Issues    HPI Here for a scheduled follow up - f/u regarding crohn's - on humira and f/u regarding increased stress. Seeing endocrinology for f/u regarding osteoporosis. Receiving reclast. Last DEXA 02/12/23 - interval improvement in her BMD when compared to 10/2020. Lowest T-score was -2.4 at the left femoral neck. Her FRAX scores were 4% and 20% at the hip and overall at 10 years, respectively. Off blood pressure medication. Blood pressure doing well. Seeing a therapist now every two weeks. This is going well. Handling stress. Continues on wellbutrin . Taking an art class. Recent travel. No chest pain or sob reported. No abdominal pain or bowel change reported.    Past Medical History:  Diagnosis Date   Anxiety    Chlamydia    Crohn's disease (HCC)    large intestine   GERD (gastroesophageal reflux disease)    Hypertension    Osteopenia    Past Surgical History:  Procedure Laterality Date   COLONOSCOPY WITH PROPOFOL  N/A 01/27/2020   Procedure: COLONOSCOPY WITH PROPOFOL ;  Surgeon: Maryruth Ole DASEN, MD;  Location: [facility] ENDOSCOPY;  Service: Endoscopy;  Laterality: N/A;   COLONOSCOPY WITH PROPOFOL  N/A 07/09/2021   Procedure: COLONOSCOPY WITH PROPOFOL ;  Surgeon: Maryruth Ole DASEN, MD;  Location: [facility] ENDOSCOPY;  Service: Endoscopy;  Laterality: N/A;   laparoscopy and tuba repair     orif right calcaneus     TONSILECTOMY/ADENOIDECTOMY WITH MYRINGOTOMY     Family History  Problem Relation Age of Onset   Lymphoma Father    Asthma Mother    Osteoporosis Mother    Mental illness Brother        suicide   Pancreatic cancer Other        uncle   COPD Maternal Grandmother    Breast cancer Neg Hx    Colon cancer Neg Hx    Social History   Socioeconomic History   Marital status: Widowed     Spouse name: Not on file   Number of children: 0   Years of education: Not on file   Highest education level: Master's degree (e.g., MA, MS, MEng, MEd, MSW, MBA)  Occupational History   Not on file  Tobacco Use   Smoking status: Former    Current packs/day: 0.00    Types: Cigarettes    Quit date: 02/09/2002    Years since quitting: 21.5   Smokeless tobacco: Never  Vaping Use   Vaping status: Never Used  Substance and Sexual Activity   Alcohol use: No    Alcohol/week: 0.0 standard drinks of alcohol   Drug use: No   Sexual activity: Not on file  Other Topics Concern   Not on file  Social History Narrative   reitred   Social Drivers of Health   Financial Resource Strain: Low Risk  (08/28/2023)   Overall Financial Resource Strain (CARDIA)    Difficulty of Paying Living Expenses: Not hard at all  Food Insecurity: No Food Insecurity (08/28/2023)   Hunger Vital Sign    Worried About Running Out of Food in the Last Year: Never true    Ran Out of Food in the Last Year: Never true  Transportation Needs: No Transportation Needs (08/28/2023)   PRAPARE - Administrator, Civil Service (Medical): No  Lack of Transportation (Non-Medical): No  Physical Activity: Sufficiently Active (08/28/2023)   Exercise Vital Sign    Days of Exercise per Week: 4 days    Minutes of Exercise per Session: 60 min  Recent Concern: Physical Activity - Insufficiently Active (07/17/2023)   Exercise Vital Sign    Days of Exercise per Week: 4 days    Minutes of Exercise per Session: 30 min  Stress: No Stress Concern Present (08/28/2023)   [employer] of Occupational Health - Occupational Stress Questionnaire    Feeling of Stress: Only a little  Social Connections: Socially Isolated (08/28/2023)   Social Connection and Isolation Panel    Frequency of Communication with Friends and Family: More than three times a week    Frequency of Social Gatherings with Friends and Family: Three times a week     Attends Religious Services: Patient declined    Active Member of Clubs or Organizations: No    Attends Banker Meetings: Not on file    Marital Status: Widowed     Review of Systems  Constitutional:  Negative for appetite change and unexpected weight change.  HENT:  Negative for congestion and sinus pressure.   Respiratory:  Negative for cough, chest tightness and shortness of breath.   Cardiovascular:  Negative for chest pain, palpitations and leg swelling.  Gastrointestinal:  Negative for abdominal pain, diarrhea, nausea and vomiting.  Genitourinary:  Negative for difficulty urinating and dysuria.  Musculoskeletal:  Negative for joint swelling and myalgias.  Skin:  Negative for color change and rash.  Neurological:  Negative for dizziness and headaches.  Psychiatric/Behavioral:  Negative for agitation and dysphoric mood.        Objective:     BP 118/68   Pulse 74   Temp 97.9 F (36.6 C)   Resp 16   Ht 5' 4 (1.626 m)   Wt 131 lb 6.4 oz (59.6 kg)   LMP 04/12/2010   SpO2 98%   BMI 22.55 kg/m  Wt Readings from Last 3 Encounters:  09/01/23 131 lb 6.4 oz (59.6 kg)  07/17/23 133 lb (60.3 kg)  04/29/23 140 lb (63.5 kg)    Physical Exam Vitals reviewed.  Constitutional:      General: She is not in acute distress.    Appearance: Normal appearance.  HENT:     Head: Normocephalic and atraumatic.     Right Ear: External ear normal.     Left Ear: External ear normal.     Mouth/Throat:     Pharynx: No oropharyngeal exudate or posterior oropharyngeal erythema.   Eyes:     General: No scleral icterus.       Right eye: No discharge.        Left eye: No discharge.     Conjunctiva/sclera: Conjunctivae normal.   Neck:     Thyroid : No thyromegaly.   Cardiovascular:     Rate and Rhythm: Normal rate and regular rhythm.  Pulmonary:     Effort: No respiratory distress.     Breath sounds: Normal breath sounds. No wheezing.  Abdominal:     General: Bowel sounds  are normal.     Palpations: Abdomen is soft.     Tenderness: There is no abdominal tenderness.   Musculoskeletal:        General: No swelling or tenderness.     Cervical back: Neck supple. No tenderness.  Lymphadenopathy:     Cervical: No cervical adenopathy.   Skin:    Findings: No erythema  or rash.   Neurological:     Mental Status: She is alert.   Psychiatric:        Mood and Affect: Mood normal.        Behavior: Behavior normal.         Outpatient Encounter Medications as of 09/01/2023  Medication Sig   Adalimumab (HUMIRA) 40 MG/0.4ML PSKT Day 1 inject 2 pens SQ, Day 15 inject 1 pen SQ & Day 29 inject 1 pen SQ   B Complex Vitamins (B COMPLEX 50) TABS Take 1 capsule by mouth daily.   buPROPion  (WELLBUTRIN  XL) 150 MG 24 hr tablet TAKE ONE TABLET BY MOUTH ONCE DAILY   cholecalciferol (VITAMIN D ) 400 UNITS TABS tablet Take 400 Units by mouth 3 (three) times a week.   fluticasone  (FLONASE ) 50 MCG/ACT nasal spray 2 sprays each nostril one time per day (in pm).   Multiple Vitamins-Minerals (ALGAE BASED CALCIUM) TABS Take 1 tablet by mouth daily.   valACYclovir  (VALTREX ) 1000 MG tablet AS DIRECTED   zoledronic acid (RECLAST) 5 MG/100ML SOLN injection    No facility-administered encounter medications on file as of 09/01/2023.     Lab Results  Component Value Date   WBC 6.3 08/21/2022   HGB 14.2 08/21/2022   HCT 43.0 08/21/2022   PLT 219.0 08/21/2022   GLUCOSE 99 01/05/2023   CHOL 176 01/05/2023   TRIG 84.0 01/05/2023   HDL 66.00 01/05/2023   LDLCALC 93 01/05/2023   ALT 19 01/05/2023   AST 22 01/05/2023   NA 138 01/05/2023   K 4.5 01/05/2023   CL 103 01/05/2023   CREATININE 0.74 01/05/2023   BUN 11 01/05/2023   CO2 29 01/05/2023   TSH 0.82 08/21/2022    No results found.     Assessment & Plan:  Hypertension, essential -     Lipid panel -     TSH  Crohn's disease with complication, unspecified gastrointestinal tract location [facility]) Assessment &  Plan: Colonoscopy 07/2021 - hyperplastic polyp x 3. Recommended f/u colonoscopy in 3 years. Receiving humira. Stable. No abdominal pain. No bowel change. Stable.    Anxiety Assessment & Plan: Overall doing well. Seeing her therapist now every two weeks.  Continue wellbutrin . Follow.    Osteoporosis without current pathological fracture, unspecified osteoporosis type Assessment & Plan:  Seeing endocrinology for f/u regarding osteoporosis. Receiving reclast. Last DEXA 02/12/23 - interval improvement in her BMD when compared to 10/2020. Lowest T-score was -2.4 at the left femoral neck. Her FRAX scores were 4% and 20% at the hip and overall at 10 years, respectively.      Allena Hamilton, MD

## 2023-09-01 NOTE — Assessment & Plan Note (Signed)
 Overall doing well. Seeing her therapist now every two weeks.  Continue wellbutrin . Follow.

## 2023-09-01 NOTE — Assessment & Plan Note (Signed)
 Colonoscopy 07/2021 - hyperplastic polyp x 3. Recommended f/u colonoscopy in 3 years. Receiving humira. Stable. No abdominal pain. No bowel change. Stable.

## 2023-09-02 ENCOUNTER — Ambulatory Visit: Payer: Self-pay | Admitting: Internal Medicine

## 2024-01-05 ENCOUNTER — Ambulatory Visit (INDEPENDENT_AMBULATORY_CARE_PROVIDER_SITE_OTHER): Admitting: Internal Medicine

## 2024-01-05 VITALS — BP 120/70 | HR 68 | Temp 97.8°F | Ht 64.0 in | Wt 134.0 lb

## 2024-01-05 DIAGNOSIS — Z1231 Encounter for screening mammogram for malignant neoplasm of breast: Secondary | ICD-10-CM | POA: Diagnosis not present

## 2024-01-05 DIAGNOSIS — Z0000 Encounter for general adult medical examination without abnormal findings: Secondary | ICD-10-CM | POA: Diagnosis not present

## 2024-01-05 DIAGNOSIS — Z23 Encounter for immunization: Secondary | ICD-10-CM | POA: Diagnosis not present

## 2024-01-05 DIAGNOSIS — F419 Anxiety disorder, unspecified: Secondary | ICD-10-CM | POA: Diagnosis not present

## 2024-01-05 DIAGNOSIS — I10 Essential (primary) hypertension: Secondary | ICD-10-CM | POA: Diagnosis not present

## 2024-01-05 DIAGNOSIS — M810 Age-related osteoporosis without current pathological fracture: Secondary | ICD-10-CM

## 2024-01-05 DIAGNOSIS — I872 Venous insufficiency (chronic) (peripheral): Secondary | ICD-10-CM

## 2024-01-05 DIAGNOSIS — K50919 Crohn's disease, unspecified, with unspecified complications: Secondary | ICD-10-CM

## 2024-01-05 MED ORDER — VALACYCLOVIR HCL 1 G PO TABS: ORAL_TABLET | ORAL | 0 refills | Status: AC

## 2024-01-05 MED ORDER — FLUTICASONE PROPIONATE 50 MCG/ACT NA SUSP: NASAL | 3 refills | Status: AC

## 2024-01-05 MED ORDER — BUPROPION HCL ER (XL) 150 MG PO TB24: 150.0000 mg | ORAL_TABLET | Freq: Every day | ORAL | 1 refills | Status: AC

## 2024-01-05 NOTE — Progress Notes (Signed)
 Subjective:    Patient ID: Lauren Chang, female    DOB: [birth year], 68 y.o.   MRN: [id number]  Patient here for  Chief Complaint  Patient presents with   Medical Management of Chronic Issues    HPI With past history of hypertension and Crohn's - she comes in today to follow up on these issues as well as for a complete physical exam. Seeing endocrinology for f/u regarding osteoporosis. Receiving reclast. Last DEXA 02/12/23 - interval improvement in her BMD when compared to 10/2020. Lowest T-score was -2.4 at the left femoral neck. Her FRAX scores were 4% and 20% at the hip and overall at 10 years, respectively. Seeing a therapist. Continues on wellbutrin . Overall appears to be doing well. Trying to stay active. No chest pain or sob reported. No abdominal pain or bowel change reported.    Past Medical History:  Diagnosis Date   Anxiety    Chlamydia    Crohn's disease (HCC)    large intestine   GERD (gastroesophageal reflux disease)    Hypertension    Osteopenia    Past Surgical History:  Procedure Laterality Date   COLONOSCOPY WITH PROPOFOL  N/A 01/27/2020   Procedure: COLONOSCOPY WITH PROPOFOL ;  Surgeon: Maryruth Ole DASEN, MD;  Location: [facility] END[facility]COPY;  Service: Endoscopy;  Laterality: N/A;   COLONOSCOPY WITH PROPOFOL  N/A 07/09/2021   Procedure: COLONOSCOPY WITH PROPOFOL ;  Surgeon: Maryruth Ole DASEN, MD;  Location: [facility] ENDOSCOPY;  Service: Endoscopy;  Laterality: N/A;   laparoscopy and tuba repair     orif right calcaneus     TONSILECTOMY/ADENOIDECTOMY WITH MYRINGOTOMY     Family History  Problem Relation Age of Onset   Lymphoma Father    Asthma Mother    Osteoporosis Mother    Mental illness Brother        suicide   Pancreatic cancer Other        uncle   COPD Maternal Grandmother    Breast cancer Neg Hx    Colon cancer Neg Hx    Social History   Socioeconomic History   Marital status: Widowed    Spouse name: Not on file   Number of children: 0   Years of  education: Not on file   Highest education level: Master's degree (e.g., MA, MS, MEng, MEd, MSW, MBA)  Occupational History   Not on file  Tobacco Use   Smoking status: Former    Current packs/day: 0.00    Types: Cigarettes    Quit date: 02/09/2002    Years since quitting: 21.9   Smokeless tobacco: Never  Vaping Use   Vaping status: Never Used  Substance and Sexual Activity   Alcohol use: No    Alcohol/week: 0.0 standard drinks of alcohol   Drug use: No   Sexual activity: Not on file  Other Topics Concern   Not on file  Social History Narrative   reitred   Social Drivers of Health   Financial Resource Strain: Low Risk  (01/05/2024)   Overall Financial Resource Strain (CARDIA)    Difficulty of Paying Living Expenses: Not hard at all  Food Insecurity: No Food Insecurity (01/05/2024)   Hunger Vital Sign    Worried About Running Out of Food in the Last Year: Never true    Ran Out of Food in the Last Year: Never true  Transportation Needs: No Transportation Needs (01/05/2024)   PRAPARE - Administrator, Civil Service (Medical): No    Lack of Transportation (Non-Medical): No  Physical Activity: Insufficiently Active (01/05/2024)   Exercise Vital Sign    Days of Exercise per Week: 6 days    Minutes of Exercise per Session: 20 min  Stress: Stress Concern Present (01/05/2024)   [employer] of Occupational Health - Occupational Stress Questionnaire    Feeling of Stress: To some extent  Social Connections: Moderately Isolated (01/05/2024)   Social Connection and Isolation Panel    Frequency of Communication with Friends and Family: Twice a week    Frequency of Social Gatherings with Friends and Family: Once a week    Attends Religious Services: 1 to 4 times per year    Active Member of [employer] or Organizations: No    Attends Banker Meetings: Not on file    Marital Status: Widowed     Review of Systems  Constitutional:  Negative for appetite  change and unexpected weight change.  HENT:  Negative for congestion and sinus pressure.   Respiratory:  Negative for cough, chest tightness and shortness of breath.   Cardiovascular:  Negative for chest pain, palpitations and leg swelling.  Gastrointestinal:  Negative for abdominal pain, diarrhea, nausea and vomiting.  Genitourinary:  Negative for difficulty urinating and dysuria.  Musculoskeletal:  Negative for joint swelling and myalgias.  Skin:  Negative for color change and rash.  Neurological:  Negative for dizziness and headaches.  Psychiatric/Behavioral:  Negative for agitation and dysphoric mood.        Objective:     BP 120/70   Pulse 68   Temp 97.8 F (36.6 C) (Oral)   Ht 5' 4 (1.626 m)   Wt 134 lb (60.8 kg)   LMP 04/12/2010   SpO2 99%   BMI 23.00 kg/m  Wt Readings from Last 3 Encounters:  01/05/24 134 lb (60.8 kg)  09/01/23 131 lb 6.4 oz (59.6 kg)  07/17/23 133 lb (60.3 kg)    Physical Exam Vitals reviewed.  Constitutional:      General: She is not in acute distress.    Appearance: Normal appearance.  HENT:     Head: Normocephalic and atraumatic.     Right Ear: External ear normal.     Left Ear: External ear normal.     Mouth/Throat:     Pharynx: No oropharyngeal exudate or posterior oropharyngeal erythema.  Eyes:     General: No scleral icterus.       Right eye: No discharge.        Left eye: No discharge.     Conjunctiva/sclera: Conjunctivae normal.  Neck:     Thyroid : No thyromegaly.  Cardiovascular:     Rate and Rhythm: Normal rate and regular rhythm.  Pulmonary:     Effort: No respiratory distress.     Breath sounds: Normal breath sounds. No wheezing.  Abdominal:     General: Bowel sounds are normal.     Palpations: Abdomen is soft.     Tenderness: There is no abdominal tenderness.  Musculoskeletal:        General: No swelling or tenderness.     Cervical back: Neck supple. No tenderness.  Lymphadenopathy:     Cervical: No cervical  adenopathy.  Skin:    Findings: No erythema or rash.  Neurological:     Mental Status: She is alert.  Psychiatric:        Mood and Affect: Mood normal.        Behavior: Behavior normal.         Outpatient Encounter Medications as of 01/05/2024  Medication Sig   Adalimumab (HUMIRA) 40 MG/0.4ML PSKT Day 1 inject 2 pens SQ, Day 15 inject 1 pen SQ & Day 29 inject 1 pen SQ   B Complex Vitamins (B COMPLEX 50) TABS Take 1 capsule by mouth daily.   cholecalciferol (VITAMIN D ) 400 UNITS TABS tablet Take 400 Units by mouth 3 (three) times a week.   Multiple Vitamins-Minerals (ALGAE BASED CALCIUM) TABS Take 1 tablet by mouth daily.   zoledronic acid (RECLAST) 5 MG/100ML SOLN injection    [DISCONTINUED] fluticasone  (FLONASE ) 50 MCG/ACT nasal spray 2 sprays each nostril one time per day (in pm).   buPROPion  (WELLBUTRIN  XL) 150 MG 24 hr tablet Take 1 tablet (150 mg total) by mouth daily.   fluticasone  (FLONASE ) 50 MCG/ACT nasal spray 2 sprays each nostril one time per day (in pm).   valACYclovir  (VALTREX ) 1000 MG tablet AS DIRECTED   [DISCONTINUED] buPROPion  (WELLBUTRIN  XL) 150 MG 24 hr tablet TAKE ONE TABLET BY MOUTH ONCE DAILY   [DISCONTINUED] valACYclovir  (VALTREX ) 1000 MG tablet AS DIRECTED   No facility-administered encounter medications on file as of 01/05/2024.     Lab Results  Component Value Date   WBC 6.3 08/21/2022   HGB 14.2 08/21/2022   HCT 43.0 08/21/2022   PLT 219.0 08/21/2022   GLUCOSE 99 01/05/2023   CHOL 196 09/01/2023   TRIG 72.0 09/01/2023   HDL 63.80 09/01/2023   LDLCALC 118 (H) 09/01/2023   ALT 19 01/05/2023   AST 22 01/05/2023   NA 138 01/05/2023   K 4.5 01/05/2023   CL 103 01/05/2023   CREATININE 0.74 01/05/2023   BUN 11 01/05/2023   CO2 29 01/05/2023   TSH 0.73 09/01/2023       Assessment & Plan:  Health care maintenance Assessment & Plan: Physical today 01/05/24.  PAP 9/1/122 - negative (atrophy) with negative HPV.  Mammogram 03/26/23- Birads I.   Colonoscopy 07/2021.      Visit for screening mammogram -     3D Screening Mammogram, Left and Right; Future  Hypertension, essential Assessment & Plan: Off hydrochlorothiazide . Continue to monitor pressure.   Orders: -     TSH; Future -     Lipid panel; Future -     Basic metabolic panel with GFR; Future  Osteoporosis without current pathological fracture, unspecified osteoporosis type Assessment & Plan:  Seeing endocrinology for f/u regarding osteoporosis. Receiving reclast. Last DEXA 02/12/23 - interval improvement in her BMD when compared to 10/2020. Lowest T-score was -2.4 at the left femoral neck. Her FRAX scores were 4% and 20% at the hip and overall at 10 years, respectively. Continue calcium, vitamin D  and weight bearing exercise.    Crohn's disease with complication, unspecified gastrointestinal tract location [facility]) Assessment & Plan: Colonoscopy 07/2021 - hyperplastic polyp x 3. Recommended f/u colonoscopy in 3 years. Receiving humira. Stable. No abdominal pain. No bowel change. Stable.   Orders: -     CBC with Differential/Platelet; Future  Need for influenza vaccination -     Flu vaccine HIGH DOSE PF(Fluzone Trivalent)  Chronic venous insufficiency Assessment & Plan: Continue compression hose.    Anxiety Assessment & Plan: Overall doing well. Continue f/u with therapy.  Continue wellbutrin . Follow.    Other orders -     buPROPion  HCl ER (XL); Take 1 tablet (150 mg total) by mouth daily.  Dispense: 90 tablet; Refill: 1 -     valACYclovir  HCl; AS DIRECTED  Dispense: 30 tablet; Refill: 0 -  Fluticasone  Propionate; 2 sprays each nostril one time per day (in pm).  Dispense: 16 g; Refill: 3     Allena Hamilton, MD

## 2024-01-05 NOTE — Assessment & Plan Note (Signed)
 Physical today 01/05/24.  PAP 9/1/122 - negative (atrophy) with negative HPV.  Mammogram 03/26/23- Birads I.  Colonoscopy 07/2021.

## 2024-01-10 NOTE — Assessment & Plan Note (Signed)
 Seeing endocrinology for f/u regarding osteoporosis. Receiving reclast. Last DEXA 02/12/23 - interval improvement in her BMD when compared to 10/2020. Lowest T-score was -2.4 at the left femoral neck. Her FRAX scores were 4% and 20% at the hip and overall at 10 years, respectively. Continue calcium, vitamin D  and weight bearing exercise.

## 2024-01-10 NOTE — Assessment & Plan Note (Signed)
 Colonoscopy 07/2021 - hyperplastic polyp x 3. Recommended f/u colonoscopy in 3 years. Receiving humira. Stable. No abdominal pain. No bowel change. Stable.

## 2024-01-10 NOTE — Assessment & Plan Note (Signed)
Continue compression hose.   

## 2024-01-10 NOTE — Assessment & Plan Note (Signed)
 Overall doing well. Continue f/u with therapy.  Continue wellbutrin . Follow.

## 2024-01-10 NOTE — Assessment & Plan Note (Signed)
 Off hydrochlorothiazide. Continue to monitor pressure.

## 2024-02-11 ENCOUNTER — Encounter: Payer: Self-pay | Admitting: Internal Medicine

## 2024-02-11 NOTE — Telephone Encounter (Signed)
 Please call and confirm she is eating. Confirm no other acute symptoms.  Keep us  posted on how she is doing and let us  know if needs anything.

## 2024-03-15 ENCOUNTER — Other Ambulatory Visit (INDEPENDENT_AMBULATORY_CARE_PROVIDER_SITE_OTHER)

## 2024-03-15 DIAGNOSIS — K50919 Crohn's disease, unspecified, with unspecified complications: Secondary | ICD-10-CM | POA: Diagnosis not present

## 2024-03-15 DIAGNOSIS — I10 Essential (primary) hypertension: Secondary | ICD-10-CM

## 2024-03-15 LAB — CBC WITH DIFFERENTIAL/PLATELET
Basophils Absolute: 0 K/uL (ref 0.0–0.1)
Basophils Relative: 0.6 % (ref 0.0–3.0)
Eosinophils Absolute: 0.5 K/uL (ref 0.0–0.7)
Eosinophils Relative: 7.8 % — ABNORMAL HIGH (ref 0.0–5.0)
HCT: 42.7 % (ref 36.0–46.0)
Hemoglobin: 14.1 g/dL (ref 12.0–15.0)
Lymphocytes Relative: 34.8 % (ref 12.0–46.0)
Lymphs Abs: 2.3 K/uL (ref 0.7–4.0)
MCHC: 33.1 g/dL (ref 30.0–36.0)
MCV: 91.2 fl (ref 78.0–100.0)
Monocytes Absolute: 0.8 K/uL (ref 0.1–1.0)
Monocytes Relative: 11.2 % (ref 3.0–12.0)
Neutro Abs: 3 K/uL (ref 1.4–7.7)
Neutrophils Relative %: 45.6 % (ref 43.0–77.0)
Platelets: 188 K/uL (ref 150.0–400.0)
RBC: 4.68 Mil/uL (ref 3.87–5.11)
RDW: 13.4 % (ref 11.5–15.5)
WBC: 6.7 K/uL (ref 4.0–10.5)

## 2024-03-15 LAB — LIPID PANEL
Cholesterol: 192 mg/dL (ref 28–200)
HDL: 66.4 mg/dL
LDL Cholesterol: 110 mg/dL — ABNORMAL HIGH (ref 10–99)
NonHDL: 125.9
Total CHOL/HDL Ratio: 3
Triglycerides: 80 mg/dL (ref 10.0–149.0)
VLDL: 16 mg/dL (ref 0.0–40.0)

## 2024-03-15 LAB — BASIC METABOLIC PANEL WITH GFR
BUN: 17 mg/dL (ref 6–23)
CO2: 30 meq/L (ref 19–32)
Calcium: 9.4 mg/dL (ref 8.4–10.5)
Chloride: 101 meq/L (ref 96–112)
Creatinine, Ser: 0.7 mg/dL (ref 0.40–1.20)
GFR: 89.3 mL/min
Glucose, Bld: 92 mg/dL (ref 70–99)
Potassium: 4.6 meq/L (ref 3.5–5.1)
Sodium: 137 meq/L (ref 135–145)

## 2024-03-15 LAB — TSH: TSH: 1.23 u[IU]/mL (ref 0.35–5.50)

## 2024-03-16 ENCOUNTER — Other Ambulatory Visit: Payer: Self-pay | Admitting: Internal Medicine

## 2024-03-16 ENCOUNTER — Ambulatory Visit: Payer: Self-pay | Admitting: Internal Medicine

## 2024-03-16 ENCOUNTER — Ambulatory Visit (INDEPENDENT_AMBULATORY_CARE_PROVIDER_SITE_OTHER)

## 2024-03-16 DIAGNOSIS — K50919 Crohn's disease, unspecified, with unspecified complications: Secondary | ICD-10-CM

## 2024-03-16 LAB — HEPATIC FUNCTION PANEL
ALT: 14 U/L (ref 3–35)
AST: 18 U/L (ref 5–37)
Albumin: 4.4 g/dL (ref 3.5–5.2)
Alkaline Phosphatase: 50 U/L (ref 39–117)
Bilirubin, Direct: 0.1 mg/dL (ref 0.1–0.3)
Total Bilirubin: 0.5 mg/dL (ref 0.2–1.2)
Total Protein: 7.2 g/dL (ref 6.0–8.3)

## 2024-03-16 NOTE — Progress Notes (Signed)
Order placed for f/u liver panel.  

## 2024-03-17 ENCOUNTER — Ambulatory Visit: Payer: Self-pay | Admitting: Internal Medicine

## 2024-03-17 NOTE — Telephone Encounter (Signed)
 Please call and confirm she is doing ok. Thank her for the update. We can send her a form to sign or she can tell them to forward information to her PCP

## 2024-03-18 NOTE — Telephone Encounter (Signed)
 Spoke to Lauren Chang.Lauren Chang stated that she was doing ok. Lauren Chang notified about form.

## 2024-03-25 ENCOUNTER — Ambulatory Visit
Admission: RE | Admit: 2024-03-25 | Discharge: 2024-03-25 | Disposition: A | Discharge status: Home / Self Care | Source: Ambulatory Visit | Attending: Internal Medicine | Admitting: Internal Medicine

## 2024-03-25 DIAGNOSIS — Z1231 Encounter for screening mammogram for malignant neoplasm of breast: Secondary | ICD-10-CM | POA: Insufficient documentation

## 2024-05-16 ENCOUNTER — Ambulatory Visit: Admitting: Internal Medicine

## 2024-07-22 ENCOUNTER — Ambulatory Visit
